# Patient Record
Sex: Male | Born: 1935 | Race: White | Hispanic: No | Marital: Married | Smoking: Former smoker
Health system: Southern US, Community
[De-identification: ages and names within clinical notes are randomized; demographics above are authoritative.]

## PROBLEM LIST (undated history)

## (undated) DIAGNOSIS — I82409 Acute embolism and thrombosis of unspecified deep veins of unspecified lower extremity: Secondary | ICD-10-CM

## (undated) DIAGNOSIS — M1990 Unspecified osteoarthritis, unspecified site: Secondary | ICD-10-CM

## (undated) DIAGNOSIS — N189 Chronic kidney disease, unspecified: Secondary | ICD-10-CM

## (undated) DIAGNOSIS — D649 Anemia, unspecified: Secondary | ICD-10-CM

## (undated) DIAGNOSIS — E119 Type 2 diabetes mellitus without complications: Secondary | ICD-10-CM

## (undated) DIAGNOSIS — Z992 Dependence on renal dialysis: Secondary | ICD-10-CM

## (undated) DIAGNOSIS — N200 Calculus of kidney: Secondary | ICD-10-CM

## (undated) DIAGNOSIS — K219 Gastro-esophageal reflux disease without esophagitis: Secondary | ICD-10-CM

## (undated) DIAGNOSIS — I8390 Asymptomatic varicose veins of unspecified lower extremity: Secondary | ICD-10-CM

## (undated) DIAGNOSIS — C801 Malignant (primary) neoplasm, unspecified: Secondary | ICD-10-CM

## (undated) DIAGNOSIS — I10 Essential (primary) hypertension: Secondary | ICD-10-CM

## (undated) DIAGNOSIS — N186 End stage renal disease: Secondary | ICD-10-CM

## (undated) DIAGNOSIS — R0602 Shortness of breath: Secondary | ICD-10-CM

## (undated) HISTORY — PX: OTHER SURGICAL HISTORY: SHX169

## (undated) HISTORY — PX: TONSILLECTOMY: SUR1361

## (undated) HISTORY — PX: BACK SURGERY: SHX140

## (undated) HISTORY — DX: Asymptomatic varicose veins of unspecified lower extremity: I83.90

## (undated) HISTORY — PX: EYE SURGERY: SHX253

## (undated) HISTORY — PX: APPENDECTOMY: SHX54

## (undated) HISTORY — PX: COLONOSCOPY: SHX174

## (undated) HISTORY — PX: CHOLECYSTECTOMY: SHX55

## (undated) HISTORY — PX: PROSTATE SURGERY: SHX751

## (undated) HISTORY — DX: Acute embolism and thrombosis of unspecified deep veins of unspecified lower extremity: I82.409

## (undated) HISTORY — PX: COLON SURGERY: SHX602

---

## 2000-09-07 ENCOUNTER — Ambulatory Visit (HOSPITAL_COMMUNITY): Admission: RE | Admit: 2000-09-07 | Discharge: 2000-09-07 | Payer: Self-pay | Admitting: Urology

## 2000-09-07 ENCOUNTER — Encounter: Payer: Self-pay | Admitting: Urology

## 2001-03-06 ENCOUNTER — Ambulatory Visit (HOSPITAL_COMMUNITY): Admission: RE | Admit: 2001-03-06 | Discharge: 2001-03-06 | Payer: Self-pay | Admitting: Internal Medicine

## 2001-03-06 ENCOUNTER — Encounter: Payer: Self-pay | Admitting: Internal Medicine

## 2003-04-29 ENCOUNTER — Ambulatory Visit (HOSPITAL_COMMUNITY): Admission: RE | Admit: 2003-04-29 | Discharge: 2003-04-29 | Payer: Self-pay | Admitting: Internal Medicine

## 2005-04-08 ENCOUNTER — Ambulatory Visit (HOSPITAL_COMMUNITY): Admission: RE | Admit: 2005-04-08 | Discharge: 2005-04-08 | Payer: Self-pay | Admitting: Pulmonary Disease

## 2006-06-01 ENCOUNTER — Ambulatory Visit: Payer: Self-pay | Admitting: Internal Medicine

## 2006-07-31 ENCOUNTER — Ambulatory Visit (HOSPITAL_COMMUNITY)
Admission: RE | Admit: 2006-07-31 | Discharge: 2006-07-31 | Payer: Self-pay | Admitting: Physical Medicine and Rehabilitation

## 2007-01-11 ENCOUNTER — Emergency Department (HOSPITAL_COMMUNITY): Admission: EM | Admit: 2007-01-11 | Discharge: 2007-01-11 | Payer: Self-pay | Admitting: Emergency Medicine

## 2008-09-05 ENCOUNTER — Ambulatory Visit (HOSPITAL_COMMUNITY)
Admission: RE | Admit: 2008-09-05 | Discharge: 2008-09-05 | Payer: Self-pay | Admitting: Physical Medicine and Rehabilitation

## 2009-01-12 DEATH — deceased

## 2009-07-24 ENCOUNTER — Encounter: Admission: RE | Admit: 2009-07-24 | Discharge: 2009-07-24 | Payer: Self-pay | Admitting: Neurosurgery

## 2009-08-21 ENCOUNTER — Ambulatory Visit (HOSPITAL_BASED_OUTPATIENT_CLINIC_OR_DEPARTMENT_OTHER): Admission: RE | Admit: 2009-08-21 | Discharge: 2009-08-21 | Payer: Self-pay | Admitting: Specialist

## 2009-09-02 ENCOUNTER — Ambulatory Visit (HOSPITAL_COMMUNITY): Admission: RE | Admit: 2009-09-02 | Discharge: 2009-09-02 | Payer: Self-pay | Admitting: Pulmonary Disease

## 2009-11-02 ENCOUNTER — Ambulatory Visit (HOSPITAL_COMMUNITY): Admission: RE | Admit: 2009-11-02 | Discharge: 2009-11-02 | Payer: Self-pay | Admitting: Neurosurgery

## 2009-12-11 ENCOUNTER — Inpatient Hospital Stay (HOSPITAL_COMMUNITY): Admission: RE | Admit: 2009-12-11 | Discharge: 2009-12-23 | Payer: Self-pay | Admitting: Neurosurgery

## 2009-12-18 ENCOUNTER — Ambulatory Visit: Payer: Self-pay | Admitting: Surgery

## 2009-12-18 ENCOUNTER — Encounter (INDEPENDENT_AMBULATORY_CARE_PROVIDER_SITE_OTHER): Payer: Self-pay | Admitting: Family Medicine

## 2009-12-19 ENCOUNTER — Encounter (INDEPENDENT_AMBULATORY_CARE_PROVIDER_SITE_OTHER): Payer: Self-pay | Admitting: Family Medicine

## 2010-04-04 ENCOUNTER — Encounter: Payer: Self-pay | Admitting: Nephrology

## 2010-04-22 ENCOUNTER — Other Ambulatory Visit (HOSPITAL_COMMUNITY): Payer: Self-pay | Admitting: Neurosurgery

## 2010-04-22 DIAGNOSIS — M549 Dorsalgia, unspecified: Secondary | ICD-10-CM

## 2010-04-23 ENCOUNTER — Ambulatory Visit (HOSPITAL_COMMUNITY)
Admission: RE | Admit: 2010-04-23 | Discharge: 2010-04-23 | Disposition: A | Discharge status: Home / Self Care | Payer: Medicare Other | Source: Ambulatory Visit | Attending: Neurosurgery | Admitting: Neurosurgery

## 2010-04-23 DIAGNOSIS — M549 Dorsalgia, unspecified: Secondary | ICD-10-CM

## 2010-04-23 DIAGNOSIS — M5126 Other intervertebral disc displacement, lumbar region: Secondary | ICD-10-CM | POA: Insufficient documentation

## 2010-04-23 DIAGNOSIS — M5380 Other specified dorsopathies, site unspecified: Secondary | ICD-10-CM | POA: Insufficient documentation

## 2010-04-23 DIAGNOSIS — M79609 Pain in unspecified limb: Secondary | ICD-10-CM | POA: Insufficient documentation

## 2010-04-23 DIAGNOSIS — Z981 Arthrodesis status: Secondary | ICD-10-CM | POA: Insufficient documentation

## 2010-04-23 DIAGNOSIS — E279 Disorder of adrenal gland, unspecified: Secondary | ICD-10-CM | POA: Insufficient documentation

## 2010-04-23 DIAGNOSIS — M545 Low back pain, unspecified: Secondary | ICD-10-CM | POA: Insufficient documentation

## 2010-04-23 DIAGNOSIS — M48061 Spinal stenosis, lumbar region without neurogenic claudication: Secondary | ICD-10-CM | POA: Insufficient documentation

## 2010-04-23 LAB — CREATININE, SERUM
Creatinine, Ser: 2.19 mg/dL — ABNORMAL HIGH (ref 0.4–1.5)
GFR calc non Af Amer: 30 mL/min — ABNORMAL LOW (ref >60)

## 2010-05-21 NOTE — Discharge Summary (Signed)
  Gregory Rangel, Gregory Rangel              ACCOUNT NO.:  [id number]  MEDICAL RECORD NO.:  [id number]          PATIENT TYPE:  INP  LOCATION:  3033                         FACILITY:  [facility]  PHYSICIAN:  Danae Orleans. Venetia Maxon, M.D.  DATE OF BIRTH:  May 19, 1933  DATE OF ADMISSION:  12/11/2009 DATE OF DISCHARGE:  12/23/2009                              DISCHARGE SUMMARY   REASON FOR ADMISSION: 1. Spondylosis. 2. Degenerative disk disease. 3. Back pain. 4. Lumbar radiculopathy.  FINAL DIAGNOSES: 1. Spondylosis. 2. Degenerative disk disease. 3. Back pain. 4. Lumbar radiculopathy. 5. Status post lumbar fusion on December 11, 2009 and interval     development of deep venous thrombosis, which required     anticoagulation with heparin and subsequently Coumadin.  HISTORY OF ILLNESS AND HOSPITAL COURSE:  Gregory Rangel is a 77 year old man with back pain and disk degeneration at L2-3, L3-4, and L4-5 levels. The patient was admitted on same day's procedure basis and underwent anterolateral decompression and fusion with interbody grafts.  The patient was mobilized postoperatively, was complaining of some back pain, which limited mobility.  The patient had required management of his diabetes.  He also developed a rash in his upper back.  He was then seen by the Hospitalist for a concern of possible cellulitis in his lower extremity.  He was found to have a DVT in his popliteal vein and was started on intravenous heparin.  It was felt not to have cellulitis. Started on Coumadin.  INR was slowly elevated on December 22, 2009 was 1.93.  The patient was then therapeutic on Coumadin on December 23, 2009, was discharged home with followup with Anticoagulation Clinic following week for INR check.  DISCHARGE MEDICATIONS:  Include: 1. Multivitamin. 2. Losartan 100 mg daily. 3. Hydrochlorothiazide 25 mg daily. 4. Simvastatin 40 mg daily. 5. Amlodipine 10 mg daily. 6. Benazepril 20 mg daily. 7. Glipizide XL 10  mg daily. 8. Actos 30 mg daily.  The patient was sent home with pain medications and oral Coumadin.  The patient was instructed to follow up with Dr. Channing Mutters in his office 3 weeks postoperatively.     Danae Orleans. Venetia Maxon, M.D.     JDS/MEDQ  D:  05/06/2010  T:  05/06/2010  Job:  [id number]  Electronically Signed by Maeola Harman M.D. on 05/21/2010 12:13:27 PM

## 2010-05-25 ENCOUNTER — Ambulatory Visit (HOSPITAL_COMMUNITY)
Admission: RE | Admit: 2010-05-25 | Discharge: 2010-05-25 | Disposition: A | Discharge status: Home / Self Care | Payer: Medicare Other | Source: Ambulatory Visit | Attending: Neurosurgery | Admitting: Neurosurgery

## 2010-05-25 ENCOUNTER — Other Ambulatory Visit (HOSPITAL_COMMUNITY): Payer: Self-pay | Admitting: Neurosurgery

## 2010-05-25 DIAGNOSIS — M47816 Spondylosis without myelopathy or radiculopathy, lumbar region: Secondary | ICD-10-CM

## 2010-05-25 DIAGNOSIS — M47817 Spondylosis without myelopathy or radiculopathy, lumbosacral region: Secondary | ICD-10-CM | POA: Insufficient documentation

## 2010-05-25 DIAGNOSIS — M545 Low back pain, unspecified: Secondary | ICD-10-CM | POA: Insufficient documentation

## 2010-05-25 DIAGNOSIS — Z09 Encounter for follow-up examination after completed treatment for conditions other than malignant neoplasm: Secondary | ICD-10-CM | POA: Insufficient documentation

## 2010-05-27 LAB — GLUCOSE, CAPILLARY
Glucose-Capillary: 112 mg/dL — ABNORMAL HIGH (ref 70–99)
Glucose-Capillary: 127 mg/dL — ABNORMAL HIGH (ref 70–99)
Glucose-Capillary: 130 mg/dL — ABNORMAL HIGH (ref 70–99)
Glucose-Capillary: 136 mg/dL — ABNORMAL HIGH (ref 70–99)
Glucose-Capillary: 138 mg/dL — ABNORMAL HIGH (ref 70–99)
Glucose-Capillary: 138 mg/dL — ABNORMAL HIGH (ref 70–99)
Glucose-Capillary: 142 mg/dL — ABNORMAL HIGH (ref 70–99)
Glucose-Capillary: 143 mg/dL — ABNORMAL HIGH (ref 70–99)
Glucose-Capillary: 143 mg/dL — ABNORMAL HIGH (ref 70–99)
Glucose-Capillary: 146 mg/dL — ABNORMAL HIGH (ref 70–99)
Glucose-Capillary: 153 mg/dL — ABNORMAL HIGH (ref 70–99)
Glucose-Capillary: 153 mg/dL — ABNORMAL HIGH (ref 70–99)
Glucose-Capillary: 159 mg/dL — ABNORMAL HIGH (ref 70–99)
Glucose-Capillary: 160 mg/dL — ABNORMAL HIGH (ref 70–99)
Glucose-Capillary: 160 mg/dL — ABNORMAL HIGH (ref 70–99)
Glucose-Capillary: 162 mg/dL — ABNORMAL HIGH (ref 70–99)
Glucose-Capillary: 173 mg/dL — ABNORMAL HIGH (ref 70–99)
Glucose-Capillary: 174 mg/dL — ABNORMAL HIGH (ref 70–99)
Glucose-Capillary: 177 mg/dL — ABNORMAL HIGH (ref 70–99)
Glucose-Capillary: 182 mg/dL — ABNORMAL HIGH (ref 70–99)
Glucose-Capillary: 188 mg/dL — ABNORMAL HIGH (ref 70–99)
Glucose-Capillary: 204 mg/dL — ABNORMAL HIGH (ref 70–99)
Glucose-Capillary: 208 mg/dL — ABNORMAL HIGH (ref 70–99)
Glucose-Capillary: 211 mg/dL — ABNORMAL HIGH (ref 70–99)
Glucose-Capillary: 213 mg/dL — ABNORMAL HIGH (ref 70–99)
Glucose-Capillary: 234 mg/dL — ABNORMAL HIGH (ref 70–99)
Glucose-Capillary: 237 mg/dL — ABNORMAL HIGH (ref 70–99)
Glucose-Capillary: 113 mg/dL — ABNORMAL HIGH (ref 70–99)
Glucose-Capillary: 123 mg/dL — ABNORMAL HIGH (ref 70–99)

## 2010-05-27 LAB — BASIC METABOLIC PANEL
BUN: 34 mg/dL — ABNORMAL HIGH (ref 6–23)
CO2: 34 mEq/L — ABNORMAL HIGH (ref 19–32)
Calcium: 9.7 mg/dL (ref 8.4–10.5)
GFR calc Af Amer: 38 mL/min — ABNORMAL LOW (ref >60)
GFR calc non Af Amer: 32 mL/min — ABNORMAL LOW (ref >60)
Glucose, Bld: 192 mg/dL — ABNORMAL HIGH (ref 70–99)
Potassium: 4 mEq/L (ref 3.5–5.1)
Sodium: 138 mEq/L (ref 135–145)
Sodium: 144 mEq/L (ref 135–145)

## 2010-05-27 LAB — COMPREHENSIVE METABOLIC PANEL
ALT: 32 U/L (ref 0–53)
AST: 36 U/L (ref 0–37)
AST: 56 U/L — ABNORMAL HIGH (ref 0–37)
Albumin: 2.6 g/dL — ABNORMAL LOW (ref 3.5–5.2)
Albumin: 2.6 g/dL — ABNORMAL LOW (ref 3.5–5.2)
Albumin: 2.7 g/dL — ABNORMAL LOW (ref 3.5–5.2)
Albumin: 2.9 g/dL — ABNORMAL LOW (ref 3.5–5.2)
Albumin: 3.6 g/dL (ref 3.5–5.2)
Alkaline Phosphatase: 74 U/L (ref 39–117)
Alkaline Phosphatase: 103 U/L (ref 39–117)
BUN: 16 mg/dL (ref 6–23)
BUN: 16 mg/dL (ref 6–23)
BUN: 23 mg/dL (ref 6–23)
BUN: 29 mg/dL — ABNORMAL HIGH (ref 6–23)
BUN: 32 mg/dL — ABNORMAL HIGH (ref 6–23)
BUN: 47 mg/dL — ABNORMAL HIGH (ref 6–23)
CO2: 26 mEq/L (ref 19–32)
CO2: 31 mEq/L (ref 19–32)
Calcium: 9.1 mg/dL (ref 8.4–10.5)
Calcium: 9.4 mg/dL (ref 8.4–10.5)
Calcium: 9.6 mg/dL (ref 8.4–10.5)
Chloride: 104 mEq/L (ref 96–112)
Chloride: 105 mEq/L (ref 96–112)
Chloride: 108 mEq/L (ref 96–112)
Chloride: 109 mEq/L (ref 96–112)
Chloride: 106 mEq/L (ref 96–112)
Creatinine, Ser: 1.73 mg/dL — ABNORMAL HIGH (ref 0.4–1.5)
Creatinine, Ser: 1.77 mg/dL — ABNORMAL HIGH (ref 0.4–1.5)
Creatinine, Ser: 1.87 mg/dL — ABNORMAL HIGH (ref 0.4–1.5)
Creatinine, Ser: 2.08 mg/dL — ABNORMAL HIGH (ref 0.4–1.5)
GFR calc Af Amer: 43 mL/min — ABNORMAL LOW (ref >60)
GFR calc Af Amer: 46 mL/min — ABNORMAL LOW (ref >60)
GFR calc non Af Amer: 38 mL/min — ABNORMAL LOW (ref >60)
GFR calc non Af Amer: 39 mL/min — ABNORMAL LOW (ref >60)
GFR calc non Af Amer: 28 mL/min — ABNORMAL LOW (ref >60)
Glucose, Bld: 100 mg/dL — ABNORMAL HIGH (ref 70–99)
Potassium: 3.9 mEq/L (ref 3.5–5.1)
Potassium: 3.9 mEq/L (ref 3.5–5.1)
Potassium: 4.6 mEq/L (ref 3.5–5.1)
Total Bilirubin: 0.5 mg/dL (ref 0.3–1.2)
Total Bilirubin: 0.5 mg/dL (ref 0.3–1.2)
Total Bilirubin: 0.5 mg/dL (ref 0.3–1.2)
Total Bilirubin: 0.4 mg/dL (ref 0.3–1.2)
Total Protein: 5.8 g/dL — ABNORMAL LOW (ref 6.0–8.3)
Total Protein: 6.2 g/dL (ref 6.0–8.3)

## 2010-05-27 LAB — CBC
HCT: 36.3 % — ABNORMAL LOW (ref 39.0–52.0)
HCT: 38 % — ABNORMAL LOW (ref 39.0–52.0)
HCT: 44.7 % (ref 39.0–52.0)
Hemoglobin: 11.7 g/dL — ABNORMAL LOW (ref 13.0–17.0)
Hemoglobin: 12.1 g/dL — ABNORMAL LOW (ref 13.0–17.0)
Hemoglobin: 14.8 g/dL (ref 13.0–17.0)
MCH: 28.6 pg (ref 26.0–34.0)
MCH: 29.2 pg (ref 26.0–34.0)
MCH: 29.3 pg (ref 26.0–34.0)
MCH: 29.4 pg (ref 26.0–34.0)
MCH: 29.8 pg (ref 26.0–34.0)
MCH: 30.4 pg (ref 26.0–34.0)
MCH: 30.5 pg (ref 26.0–34.0)
MCHC: 31.8 g/dL (ref 30.0–36.0)
MCHC: 32 g/dL (ref 30.0–36.0)
MCHC: 32.1 g/dL (ref 30.0–36.0)
MCHC: 32.1 g/dL (ref 30.0–36.0)
MCHC: 33.3 g/dL (ref 30.0–36.0)
MCV: 89.5 fL (ref 78.0–100.0)
MCV: 90.8 fL (ref 78.0–100.0)
MCV: 91.4 fL (ref 78.0–100.0)
MCV: 91.4 fL (ref 78.0–100.0)
MCV: 92 fL (ref 78.0–100.0)
Platelets: 167 10*3/uL (ref 150–400)
Platelets: 207 10*3/uL (ref 150–400)
Platelets: 215 10*3/uL (ref 150–400)
Platelets: 229 10*3/uL (ref 150–400)
Platelets: 171 10*3/uL (ref 150–400)
RBC: 3.97 MIL/uL — ABNORMAL LOW (ref 4.22–5.81)
RBC: 4 MIL/uL — ABNORMAL LOW (ref 4.22–5.81)
RBC: 4.09 MIL/uL — ABNORMAL LOW (ref 4.22–5.81)
RBC: 4.86 MIL/uL (ref 4.22–5.81)
RDW: 15 % (ref 11.5–15.5)
RDW: 15.1 % (ref 11.5–15.5)
RDW: 15.1 % (ref 11.5–15.5)
WBC: 7.9 10*3/uL (ref 4.0–10.5)
WBC: 8.4 10*3/uL (ref 4.0–10.5)
WBC: 10.3 10*3/uL (ref 4.0–10.5)

## 2010-05-27 LAB — URINALYSIS, ROUTINE W REFLEX MICROSCOPIC
Ketones, ur: NEGATIVE mg/dL
Nitrite: NEGATIVE
pH: 5 (ref 5.0–8.0)

## 2010-05-27 LAB — DIFFERENTIAL
Basophils Absolute: 0.1 10*3/uL (ref 0.0–0.1)
Basophils Relative: 1 % (ref 0–1)
Basophils Relative: 1 % (ref 0–1)
Eosinophils Absolute: 0.5 10*3/uL (ref 0.0–0.7)
Eosinophils Relative: 6 % — ABNORMAL HIGH (ref 0–5)
Eosinophils Relative: 2 % (ref 0–5)
Lymphs Abs: 1.1 10*3/uL (ref 0.7–4.0)
Monocytes Absolute: 0.8 10*3/uL (ref 0.1–1.0)
Neutro Abs: 7.4 10*3/uL (ref 1.7–7.7)

## 2010-05-27 LAB — PROTIME-INR
INR: 1.16 (ref 0.00–1.49)
INR: 1.93 — ABNORMAL HIGH (ref 0.00–1.49)
INR: 2.1 — ABNORMAL HIGH (ref 0.00–1.49)
Prothrombin Time: 13.6 seconds (ref 11.6–15.2)
Prothrombin Time: 15 seconds (ref 11.6–15.2)
Prothrombin Time: 22.2 seconds — ABNORMAL HIGH (ref 11.6–15.2)
Prothrombin Time: 23.7 seconds — ABNORMAL HIGH (ref 11.6–15.2)
Prothrombin Time: 12.6 seconds (ref 11.6–15.2)

## 2010-05-27 LAB — HEPARIN LEVEL (UNFRACTIONATED)
Heparin Unfractionated: 0.31 IU/mL (ref 0.30–0.70)
Heparin Unfractionated: 0.37 IU/mL (ref 0.30–0.70)
Heparin Unfractionated: 0.41 IU/mL (ref 0.30–0.70)

## 2010-05-27 LAB — APTT: aPTT: 47 seconds — ABNORMAL HIGH (ref 24–37)

## 2010-05-27 LAB — TYPE AND SCREEN: Antibody Screen: NEGATIVE

## 2010-05-27 LAB — CULTURE, BLOOD (ROUTINE X 2)
Culture  Setup Time: 201110080203
Culture: NO GROWTH

## 2010-05-27 LAB — C-REACTIVE PROTEIN: CRP: 10.6 mg/dL — ABNORMAL HIGH (ref <0.6)

## 2010-05-31 LAB — CBC
MCHC: 33 g/dL (ref 30.0–36.0)
MCV: 91.4 fL (ref 78.0–100.0)
Platelets: 202 10*3/uL (ref 150–400)
WBC: 8.3 10*3/uL (ref 4.0–10.5)

## 2010-05-31 LAB — DIFFERENTIAL
Basophils Relative: 1 % (ref 0–1)
Eosinophils Absolute: 0.1 10*3/uL (ref 0.0–0.7)
Neutrophils Relative %: 73 % (ref 43–77)

## 2010-05-31 LAB — BASIC METABOLIC PANEL
BUN: 36 mg/dL — ABNORMAL HIGH (ref 6–23)
CO2: 29 mEq/L (ref 19–32)
Chloride: 104 mEq/L (ref 96–112)
Creatinine, Ser: 2.02 mg/dL — ABNORMAL HIGH (ref 0.4–1.5)

## 2010-05-31 LAB — GLUCOSE, CAPILLARY: Glucose-Capillary: 110 mg/dL — ABNORMAL HIGH (ref 70–99)

## 2010-06-18 ENCOUNTER — Ambulatory Visit (HOSPITAL_COMMUNITY)
Admission: RE | Admit: 2010-06-18 | Discharge: 2010-06-18 | Disposition: A | Discharge status: Home / Self Care | Payer: Medicare Other | Source: Ambulatory Visit | Attending: Neurosurgery | Admitting: Neurosurgery

## 2010-06-18 ENCOUNTER — Other Ambulatory Visit (HOSPITAL_COMMUNITY): Payer: Self-pay | Admitting: Neurosurgery

## 2010-06-18 DIAGNOSIS — M4716 Other spondylosis with myelopathy, lumbar region: Secondary | ICD-10-CM

## 2010-06-18 DIAGNOSIS — M545 Low back pain, unspecified: Secondary | ICD-10-CM | POA: Insufficient documentation

## 2010-06-23 ENCOUNTER — Other Ambulatory Visit (HOSPITAL_COMMUNITY): Payer: Self-pay | Admitting: Pulmonary Disease

## 2010-06-23 DIAGNOSIS — I82409 Acute embolism and thrombosis of unspecified deep veins of unspecified lower extremity: Secondary | ICD-10-CM

## 2010-07-14 ENCOUNTER — Ambulatory Visit (HOSPITAL_COMMUNITY)
Admission: RE | Admit: 2010-07-14 | Discharge: 2010-07-14 | Disposition: A | Discharge status: Home / Self Care | Payer: Medicare Other | Source: Ambulatory Visit | Attending: Pulmonary Disease | Admitting: Pulmonary Disease

## 2010-07-14 DIAGNOSIS — M7989 Other specified soft tissue disorders: Secondary | ICD-10-CM | POA: Insufficient documentation

## 2010-07-14 DIAGNOSIS — I82409 Acute embolism and thrombosis of unspecified deep veins of unspecified lower extremity: Secondary | ICD-10-CM

## 2010-07-30 NOTE — Op Note (Signed)
NAMEMarland Kitchen  Gregory Rangel, Gregory Rangel                        ACCOUNT NO.:  [id number]   MEDICAL RECORD NO.:  [id number]                   PATIENT TYPE:  AMB   LOCATION:  DAY                                  FACILITY:  [facility]   PHYSICIAN:  Lionel December, M.D.                 DATE OF BIRTH:  April 16, 1933   DATE OF PROCEDURE:  04/29/2003  DATE OF DISCHARGE:                                 OPERATIVE REPORT   PROCEDURE PERFORMED:  Total colonoscopy with polypectomy.   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  His last colonoscopy was over four years ago.  He  is undergoing surveillance exam.  Presently, he does not have any GI  symptoms.  The procedure was reviewed with the patient and informed consent  was obtained.   PREOP MEDICATIONS:  Demerol 50 mg IV, Versed 5 mg IV in divided dose.   FINDINGS:  Procedure performed in endoscopy suite.  Patient's vital signs  and oxygen saturations were monitored during procedure and remained stable.  The patient was placed in left lateral position and rectal exam was  performed.  No abnormality noted external or digital exam.  Olympus  videoscope was placed in the rectum and advanced to the region of the  sigmoid colon where scattered diverticula were noted.  Preparation was  satisfactory.  Scope was passed in the area of the hepatic flexure where  ileocolic anastomosis was identified.  Distal segment of terminal ileum was  examined and was normal.  Anastomosis was wide open.  As the scope was  withdrawn, colonic mucosa was carefully examined.  There were two small  polyps a few centimeters distal to the anastomosis which were snared and  retrieved for histologic examination and sent in one container.  There was  another small polyp in the descending colon which was coagulated using snare  tip.  Mucosa rest of the colon was normal.   Rectal mucosa similarly was  normal.  Prominent submucosal rectal veins were noted.  Scope was  retroflexed to examine.  Anorectal junction was unremarkable. Endoscope was  straightened and withdrawn.  The patient tolerated the procedure well.   FINAL DIAGNOSES:  1. Left colonic diverticulosis.  2. Two small polyps snared from the proximal transverse colon distal to     anastomosis.  Third polyp was coagulated.   RECOMMENDATIONS:  1. High fiber diet.  2. Citrucel or equivalent 1 tablespoonful daily.  3. I will be contacting patient with biopsy result.  4. He should return for follow-up exam in four to five years from now.      ___________________________________________  Lionel December, M.D.   NR/MEDQ  D:  04/29/2003  T:  04/29/2003  Job:  [id number]   cc:   Ramon Dredge L. Juanetta Gosling, M.D.  [address]  [place]  [place] [address]  Fax: [phone]

## 2010-08-23 ENCOUNTER — Other Ambulatory Visit (HOSPITAL_COMMUNITY): Payer: Self-pay | Admitting: Neurosurgery

## 2010-08-23 DIAGNOSIS — M47817 Spondylosis without myelopathy or radiculopathy, lumbosacral region: Secondary | ICD-10-CM

## 2010-08-25 ENCOUNTER — Ambulatory Visit (HOSPITAL_COMMUNITY): Payer: Medicare Other

## 2010-08-26 ENCOUNTER — Ambulatory Visit (HOSPITAL_COMMUNITY)
Admission: RE | Admit: 2010-08-26 | Discharge: 2010-08-26 | Disposition: A | Discharge status: Home / Self Care | Payer: Medicare Other | Source: Ambulatory Visit | Attending: Neurosurgery | Admitting: Neurosurgery

## 2010-08-26 DIAGNOSIS — M79609 Pain in unspecified limb: Secondary | ICD-10-CM | POA: Insufficient documentation

## 2010-08-26 DIAGNOSIS — M545 Low back pain, unspecified: Secondary | ICD-10-CM | POA: Insufficient documentation

## 2010-08-26 DIAGNOSIS — M47817 Spondylosis without myelopathy or radiculopathy, lumbosacral region: Secondary | ICD-10-CM

## 2010-12-22 LAB — DIFFERENTIAL
Basophils Relative: 1
Lymphs Abs: 1.6
Monocytes Relative: 6
Neutro Abs: 8.9 — ABNORMAL HIGH
Neutrophils Relative %: 77

## 2010-12-22 LAB — CBC
MCHC: 33.1
RBC: 4.82
WBC: 11.5 — ABNORMAL HIGH

## 2010-12-22 LAB — BASIC METABOLIC PANEL
Calcium: 9.9
Creatinine, Ser: 2.11 — ABNORMAL HIGH
GFR calc Af Amer: 38 — ABNORMAL LOW

## 2011-04-05 DIAGNOSIS — M1710 Unilateral primary osteoarthritis, unspecified knee: Secondary | ICD-10-CM | POA: Diagnosis not present

## 2011-04-19 DIAGNOSIS — Z961 Presence of intraocular lens: Secondary | ICD-10-CM | POA: Diagnosis not present

## 2011-04-19 DIAGNOSIS — E119 Type 2 diabetes mellitus without complications: Secondary | ICD-10-CM | POA: Diagnosis not present

## 2011-04-19 DIAGNOSIS — H2510 Age-related nuclear cataract, unspecified eye: Secondary | ICD-10-CM | POA: Diagnosis not present

## 2011-04-19 DIAGNOSIS — H26019 Infantile and juvenile cortical, lamellar, or zonular cataract, unspecified eye: Secondary | ICD-10-CM | POA: Diagnosis not present

## 2011-05-05 DIAGNOSIS — E1159 Type 2 diabetes mellitus with other circulatory complications: Secondary | ICD-10-CM | POA: Diagnosis not present

## 2011-05-05 DIAGNOSIS — E119 Type 2 diabetes mellitus without complications: Secondary | ICD-10-CM | POA: Diagnosis not present

## 2011-05-09 ENCOUNTER — Ambulatory Visit (HOSPITAL_COMMUNITY)
Admission: RE | Admit: 2011-05-09 | Discharge: 2011-05-09 | Disposition: A | Discharge status: Home / Self Care | Payer: Medicare Other | Source: Ambulatory Visit | Attending: Neurosurgery | Admitting: Neurosurgery

## 2011-05-09 DIAGNOSIS — M545 Low back pain, unspecified: Secondary | ICD-10-CM | POA: Diagnosis not present

## 2011-05-09 DIAGNOSIS — IMO0001 Reserved for inherently not codable concepts without codable children: Secondary | ICD-10-CM | POA: Diagnosis not present

## 2011-05-09 DIAGNOSIS — M6281 Muscle weakness (generalized): Secondary | ICD-10-CM | POA: Diagnosis not present

## 2011-05-09 DIAGNOSIS — R269 Unspecified abnormalities of gait and mobility: Secondary | ICD-10-CM | POA: Diagnosis not present

## 2011-05-09 DIAGNOSIS — M47816 Spondylosis without myelopathy or radiculopathy, lumbar region: Secondary | ICD-10-CM | POA: Insufficient documentation

## 2011-05-09 NOTE — Evaluation (Signed)
Physical Therapy Evaluation  Patient Details  Name: Gregory Rangel MRN: [id number] Date of Birth: 07/31/1933  Today's Date: 05/09/2011 Time: 9629-5284 Time Calculation (min): 44 min Charges: 1 eval Visit#: 1  of 12   Re-eval: 06/08/11 Assessment Diagnosis: Low Back Pain Surgical Date: 12/13/11 Next MD Visit: April Prior Therapy: None  Subjective Symptoms/Limitations Symptoms: Pt reports that spinal fusion in his low back in about October 2011, and June 2012.  He reports that he continues to have pain after the surgery.  He had a R knee scope, because he had so much pain he couldn't walk.  Recently had a cortisone injection to the R knee.  Pain: 2-10/10 to his low back.  nature: Throbbing pain to his low back.  Taking pain tyleonol to control pain  history of: DM II, Colon Cancer. How long can you sit comfortably?: 30 minutes.   How long can you stand comfortably?: 5 minutes while leaning on a counter or cane.  How long can you walk comfortably?: 15 minutes with a SPC Pain Assessment Currently in Pain?: Yes Pain Score:   2 Pain Location: Back Pain Frequency: Constant  Precautions/Restrictions  Precautions Precautions: Back;Fall Precaution Booklet Issued: Yes (comment) Precaution Comments: PMH: COLON CANCER   Prior Functioning  Home Living Lives With: Spouse Receives Help From: Family Type of Home: House Home Access: Stairs to enter Entrance Stairs-Rails: None Entrance Stairs-Number of Steps: 2 Prior Function Leisure: Hobbies-yes (Comment) Comments: He enjoys working outside and mowing his lawn, planting a garden  Cognition/Observation Observation/Other Assessments Observations: Significant kyphosis. requires mod A to acheieve prone position.  Significant decrease in bilateral: hamstring, piriformis, hip flexor, quadricep, gluteal and lumbar flexibility.  Sensation/Coordination/Flexibility/Functional Tests Functional Tests Functional Tests: ODI:  66%  Assessment RLE AROM (degrees) RLE Overall AROM Comments: Decreased R knee AROM secondary to previous knee scope. RLE Strength Right Hip Flexion: 3/5 Right Hip Extension: 2+/5 Right Hip ADduction: 2+/5 Right Knee Flexion: 4/5 Right Knee Extension: 3+/5 LLE Strength Left Hip Flexion: 3/5 Left Hip Extension: 2+/5 Left Hip ABduction: 2/5 Left Hip ADduction: 2/5 Left Knee Flexion: 4/5 Left Knee Extension: 4/5 Lumbar AROM Lumbar Flexion: Decreased 75% Lumbar Extension: Decreased 100% Lumbar - Right Side Bend: Decreased 90% Lumbar - Left Side Bend: Decreased 90% Lumbar Strength Lumbar Flexion: 2/5 Lumbar Extension: 2/5 Palpation Palpation: increased pain and spasm to the L gluteal and lumbosacral region.  Mobility/Balance  Bed Mobility Bed Mobility: Yes (MOD A for supine to prone to supine) Ambulation/Gait Ambulation/Gait: Yes Ambulation/Gait Assistance: 6: Modified independent (Device/Increase time) Assistive device: Straight cane Gait Pattern: Step-through pattern;Antalgic;Lateral hip instability;Decreased trunk rotation Posture/Postural Control Posture/Postural Control: Postural limitations Postural Limitations: Pt with significant kyphosis and notable core weakness which allows for slouched posture and upper cross syndrome.  Static Standing Balance Static Standing - Comment/# of Minutes: Pt requires assistive device for balance and unable to demonstrate balance activities secondary to pain.   Exercise/Treatments Stability Bridge: 10 reps Straight Leg Raises: 10 Reps BLE, Prone Supine Ab set with single leg extension x10 BLE  Physical Therapy Assessment and Plan PT Assessment and Plan Clinical Impression Statement: Pt is a 78 year old male referred to PT for lumbar spondylosis with history of back surgery.  After examiniation it was found that he has current impairments including increased pain, decreased core and LE strength and power, decreased muscular endurance,  impaired balance, difficulty walking, impaired flexibility and impaired percieved functional ability which is limiting his ability to participate in household activities.  Pt will  benefit from skilled OPPT in order to address above impairments in order to maximzie indepenced and improve QOL. Rehab Potential: Fair PT Frequency: Min 3X/week PT Duration: 6 weeks PT Treatment/Interventions: Gait training;Stair training;Functional mobility training;Therapeutic activities;Therapeutic exercise;Balance training;Patient/family education;Other (comment) (MANUAL ONLY, NO MODALITIES) PT Plan: NO MODALITIES.  Add either bike or TM walking for endurance, functional squats, hee/toe raises, standing marching, tandem stance, core stability exercises, heel squeezes, LTR    Goals Home Exercise Program Pt will Perform Home Exercise Program: Independently PT Goal: Perform Home Exercise Program - Progress: Goal set today PT Short Term Goals Time to Complete Short Term Goals: 2 weeks PT Short Term Goal 1: Pt will report pain less than or equal to 5/10 for 50% of his day. PT Short Term Goal 2: Pt will improve his core strength by 1 muscle grade. PT Short Term Goal 3: Pt will present with decreased muscular adhesions to his lumbosacral and gluteal region.  PT Short Term Goal 4: Pt will improve his LE endurance and ambulate 2 minutes independently.  PT Long Term Goals Time to Complete Long Term Goals: 4 weeks PT Long Term Goal 1: Pt will report pain less than 3/10 for 75% of his day for improved QOL PT Long Term Goal 2: Pt will imrpove his ODI score to less than or equal to 45% for improved QOL. Long Term Goal 3: Pt will improve his core strength by 2 muscle grades in order to tolerate standing for 10 minutes in order to complete household activities. Long Term Goal 4: Pt will improve LE flexibility and endurance in order to tolerate ambulating independently x15 minutes in order to participate in community activities.   PT Long Term Goal 5: Pt will improve his dynamic balance and ambulate independently on grass x100 feet in order to safely return to outdoor hobbies.   Problem List Patient Active Problem List  Diagnoses  . Lumbago  . Lumbar spondylosis    PT - End of Session Activity Tolerance: Patient limited by pain PT Plan of Care PT Home Exercise Plan: see scanned report Consulted and Agree with Plan of Care: Patient  GP  Functional Reporting Modifier  Current Status  [phone] - Mobility: Walking & Moving Around CL - At least 60% but less than 80% impaired, limited or restricted  Goal Status  [id number] - Mobility: [place] & Moving Around CJ - At least 20% but less than 40% impaired, limited or restricted   Based on ODI 66% impairment  Ameirah Khatoon 05/09/2011, 4:51 PM  Physician Documentation Your signature is required to indicate approval of the treatment plan as stated above.  Please sign and either send electronically or make a copy of this report for your files and return this physician signed original.   Please mark one 1.__approve of plan  2. ___approve of plan with the following conditions.   ______________________________                                                          _____________________ Physician Signature  Date  

## 2011-05-11 ENCOUNTER — Ambulatory Visit (HOSPITAL_COMMUNITY)
Admission: RE | Admit: 2011-05-11 | Discharge: 2011-05-11 | Disposition: A | Discharge status: Home / Self Care | Payer: Medicare Other | Source: Ambulatory Visit | Attending: Pulmonary Disease | Admitting: Pulmonary Disease

## 2011-05-11 NOTE — Progress Notes (Signed)
Physical Therapy Treatment Patient Details  Name: MASATO PETTIE MRN: [id number] Date of Birth: 1933-08-24  Today's Date: 05/11/2011 Time: 1350-1440 Time Calculation (min): 50 min Visit#: 2  of 12   Re-eval: 06/08/11  Charge: therex 33 min  NMR 17 min  Subjective: Symptoms/Limitations Symptoms: LBP 6/10 both sides.  Pain Assessment Currently in Pain?: Yes Pain Score:   6 Pain Location: Back Pain Orientation: Lower  Objective:   Exercise/Treatments Stretches Lower Trunk Rotation: 5 reps;10 seconds Stability Clam: Supine;10 reps;3 seconds Bridge: 10 reps Bent Knee Raise: Supine;10 reps;3 seconds Ab Set: Supine;Limitations AB Set Limitations: with bent knee raise Heel Squeeze: Prone;5 reps;5 seconds Leg Raise: Prone;5 reps Functional Squats: 10 reps Heel Raises: 10 reps;Limitations Heel Raises Limitations: toe raises 10 reps Machine Exercises Stationary Bike: 6' @ 1.0 for endurance  Standing SLS: L 11", R 27" max of 3 no HHA Other Standing Knee Exercises: marching 10 reps 5" holds Other Standing Knee Exercises: tandem stance 3x 30"     Physical Therapy Assessment and Plan PT Assessment and Plan Clinical Impression Statement: Began new exercises per PT POC, pt required constant cueing for posture with standing exercises.  Pt required manual assistance with squats for proper form/technique.  Weak core musculature presented with new mat activitie with min cueing for stability required.  Pt tolerated well towards total session. PT Plan: NO MODALITIES.  Continue with current POC, switch to TM walking for endurance with emphasis on posture, progress to postural strengthening with tbands when ready.    Goals    Problem List Patient Active Problem List  Diagnoses  . Lumbago  . Lumbar spondylosis    PT - End of Session Activity Tolerance: Patient tolerated treatment well General Behavior During Session: [facility] for tasks performed Cognition: [facility] for tasks  performed  Juel Burrow, PTA 05/11/2011, 3:58 PM

## 2011-05-13 ENCOUNTER — Ambulatory Visit (HOSPITAL_COMMUNITY)
Admission: RE | Admit: 2011-05-13 | Discharge: 2011-05-13 | Disposition: A | Discharge status: Home / Self Care | Payer: Medicare Other | Source: Ambulatory Visit | Attending: Pulmonary Disease | Admitting: Pulmonary Disease

## 2011-05-13 DIAGNOSIS — R269 Unspecified abnormalities of gait and mobility: Secondary | ICD-10-CM | POA: Diagnosis not present

## 2011-05-13 DIAGNOSIS — M6281 Muscle weakness (generalized): Secondary | ICD-10-CM | POA: Insufficient documentation

## 2011-05-13 DIAGNOSIS — IMO0001 Reserved for inherently not codable concepts without codable children: Secondary | ICD-10-CM | POA: Insufficient documentation

## 2011-05-13 DIAGNOSIS — M545 Low back pain, unspecified: Secondary | ICD-10-CM | POA: Diagnosis not present

## 2011-05-13 NOTE — Progress Notes (Signed)
Physical Therapy Treatment Patient Details  Name: Gregory Rangel MRN: [id number] Date of Birth: Mar 03, 1934  Today's Date: 05/13/2011 Time: 4098-1191 Time Calculation (min): 43 min Visit#: 3  of 12   Re-eval: 06/08/11  Charge: therex: 43 min  Subjective: Symptoms/Limitations Symptoms: LBP 6/10 and R knee that I had replaced last year is bothering me as well.   Pain Assessment Currently in Pain?: Yes Pain Score:   6 Pain Location: Back Pain Orientation: Lower  Objective:   Exercise/Treatments Stretches Lower Trunk Rotation: 5 reps;10 seconds Lumbar Exercises Scapular Retraction: 10 reps;Standing;Theraband;Limitations Theraband Level (Scapular Retraction): Level 2 (Red) Scapular Retraction Limitations: with manual assistance for proper technique and vc-ing for posture; scapular retraction standing with emphasis on posture 10 reps Row: 10 reps;Standing;Theraband Theraband Level (Row): Level 2 (Red) Shoulder Extension: 10 reps;Standing;Theraband Theraband Level (Shoulder Extension): Level 2 (Red) Stability Clam: Supine;10 reps;3 seconds Bridge: 10 reps Bent Knee Raise: Supine;10 reps;3 seconds Ab Set: Supine;Limitations AB Set Limitations: with bent knee raise Machine Exercises Tread Mill: 6' @ 1.2 with cueing for posture  Standing Other Standing Knee Exercises: glut sets standing with cueing for posture 10x 5"    Physical Therapy Assessment and Plan PT Assessment and Plan Clinical Impression Statement: Began tband exercises for postural strengthening, pt required manual assistance for proper form/technique and vc-ing for posture with all standing exercises.  Pt able to complete all mat activities correclty with mod cueing for stability. PT Plan: NO MODALITIES. Continue with current POC, progress core strengthening and posture, TM for endurance/posture. Begin isometric hip flexion next session. Resume prone exercises and balance activites.   Goals    Problem  List Patient Active Problem List  Diagnoses  . Lumbago  . Lumbar spondylosis    PT - End of Session Activity Tolerance: Patient tolerated treatment well General Behavior During Session: [facility] for tasks performed Cognition: [facility] for tasks performed  Juel Burrow, PTA 05/13/2011, 6:15 PM

## 2011-05-16 ENCOUNTER — Ambulatory Visit (HOSPITAL_COMMUNITY)
Admission: RE | Admit: 2011-05-16 | Discharge: 2011-05-16 | Disposition: A | Discharge status: Home / Self Care | Payer: Medicare Other | Source: Ambulatory Visit | Attending: Pulmonary Disease | Admitting: Pulmonary Disease

## 2011-05-16 NOTE — Progress Notes (Signed)
Physical Therapy Treatment Patient Details  Name: Gregory Rangel MRN: [id number] Date of Birth: May 19, 1933  Today's Date: 05/16/2011 Time: 4098-1191 Time Calculation (min): 38 min Charges: 30' manual, 8' TE Visit#: 4  of 12   Re-eval: 06/08/11    Subjective: Symptoms/Limitations Symptoms: Pt comes in today in significant pain.  "I was about to die after the treatment on Friday.  Then I did a lot of walking at a flea market on Saturday.  I had to take a lot of Tylenol to get through the day." Pain Assessment Currently in Pain?: Yes Pain Score:   6 Pain Location: Back  Exercise/Treatments Stretches Single Knee to Chest Stretch: 3 reps;30 seconds;Limitations Single Knee to Chest Stretch Limitations: BLE Lower Trunk Rotation: 5 reps;10 seconds Stability Bridge: 5 reps;Limitations Bridge Limitations: w/manual assistance Machine Exercises Tread Mill: Held for pain  Manual Therapy Manual Therapy: Other (comment) Other Manual Therapy: Manual therapy w/STM with pt in R and L sidelying with back stretch (top knee flexed and over top of bottom, bottom knee straight) in order to decrease muscle spasms.   SCS to lumbar region with pt in sidelying and IR hip repeated several times. x30'    Physical Therapy Assessment and Plan PT Assessment and Plan Clinical Impression Statement: Today's treatment focused on decreasing overall pain using manual techniques followed by low level ROM exercise. Pt had significant decrease in muscle spasms and reported significant reduction in pain after treatment today.  Continues to demonstrate weak core stabiliation muscles and decreased pelvic mobility.  PT Plan: NO MODALITIES. Continue to decreae overall pain with use of manual techniques and exercise.  then progress back to core stabilization.  Hold off on TM until patient has a decrease in pain.     Goals    Problem List Patient Active Problem List  Diagnoses  . Lumbago  . Lumbar spondylosis   Aloura Matsuoka 05/16/2011, 4:34 PM

## 2011-05-18 ENCOUNTER — Ambulatory Visit (HOSPITAL_COMMUNITY)
Admission: RE | Admit: 2011-05-18 | Discharge: 2011-05-18 | Disposition: A | Discharge status: Home / Self Care | Payer: Medicare Other | Source: Ambulatory Visit | Attending: Pulmonary Disease | Admitting: Pulmonary Disease

## 2011-05-18 NOTE — Progress Notes (Signed)
Physical Therapy Treatment Patient Details  Name: Gregory Rangel MRN: [id number] Date of Birth: 1933-11-09  Today's Date: 05/18/2011 Time: 4098-1191 Time Calculation (min): 42 min Visit#: 5  of 12   Re-eval: 06/08/11 Charges:  therex 26'  , manual 12'    Subjective: Symptoms/Limitations Symptoms: Pt. states he's still in significant pain, more on L lumbar than R.  Pt. states he is scared to do too much because the screws might come out; reassured him this would not happen. Pain Assessment Currently in Pain?: Yes Pain Score:   6 Pain Location: Back Pain Orientation: Lower   Exercise/Treatments Stretches Single Knee to Chest Stretch: 3 reps;30 seconds;Limitations Single Knee to Chest Stretch Limitations: BLE Lower Trunk Rotation: 5 reps;10 seconds;Limitations Lower Trunk Rotation Limitations: seated and supine Prone Mid Back Stretch: 3 reps;30 seconds;Limitations Prone Mid Back Stretch Limitations: done in seated position Stability Bridge: 5 reps;Limitations Bridge Limitations: w/manual assistance Machine Exercises Tread Mill: Held for pain  Manual Therapy Other Manual Therapy: STM to B lumbar with pt. in L sidelying X 12'  Physical Therapy Assessment and Plan PT Assessment and Plan Clinical Impression Statement: Pt. reported overall reduction of pain in R lumbar area but not L.  Pt. with significant palpable reduction of spasm B lumbar after massage.  Added seated stretches for mid back and lumbar rotation.  Pt. very rigid with rotation, requirng AA. PT Plan: Attempt STM with pt. R sidelying next visit so can concentrate more on L side.     Problem List Patient Active Problem List  Diagnoses  . Lumbago  . Lumbar spondylosis    PT - End of Session Activity Tolerance: Patient tolerated treatment well General Behavior During Session: [facility] for tasks performed Cognition: [facility] for tasks performed   Gracen Ringwald B. Bascom Levels, PTA 05/18/2011, 3:19 PM

## 2011-05-20 ENCOUNTER — Ambulatory Visit (HOSPITAL_COMMUNITY)
Admission: RE | Admit: 2011-05-20 | Discharge: 2011-05-20 | Disposition: A | Discharge status: Home / Self Care | Payer: Medicare Other | Source: Ambulatory Visit | Attending: Pulmonary Disease | Admitting: Pulmonary Disease

## 2011-05-20 NOTE — Progress Notes (Signed)
Physical Therapy Treatment Patient Details  Name: Gregory Rangel MRN: [id number] Date of Birth: 1933/05/03  Today's Date: 05/20/2011 Time: 1430-1520 Time Calculation (min): 50 min Visit#: 6  of 12   Re-eval: 06/08/11  Charge: therex:38 min STM 12 min  Subjective: Symptoms/Limitations Symptoms: Pt reported he is still in significant pain lumbar equal L and R.   Pain Assessment Currently in Pain?: Yes Pain Score:   5 Pain Location: Back Pain Orientation: Lower  Objective:   Exercise/Treatments Stretches Passive Hamstring Stretch: 3 reps;30 seconds Single Knee to Chest Stretch: 3 reps;30 seconds;Limitations Single Knee to Chest Stretch Limitations: BLE with towel AA Lower Trunk Rotation: Limitations Lower Trunk Rotation Limitations: supine 10x 10" Prone Mid Back Stretch: 3 reps;30 seconds;Limitations Prone Mid Back Stretch Limitations: done in seated position Lumbar Exercises Scapular Retraction: 10 reps;Standing;Limitations Scapular Retraction Limitations: 10 reps posterior shoulder rolls for posture. Stability Bridge: 5 reps;Limitations Bridge Limitations: w/manual assistance and glut sets Machine Exercises Tread Mill: 6' @ 1.2 with max cueing for posture.  Stretches Passive Hamstring Stretch: 3 reps;30 seconds  Physical Therapy Assessment and Plan PT Assessment and Plan Clinical Impression Statement: Resumed TM with no verbal c/o but did show painful facial expressions.  Max cueing for posture with gait and all standing exercises.  Pt reported overall reduction of pain in R lumbar but no change L side.   PT Plan: Continue STM to reduce lumbar R>L spams/pain.  Also continue with posture therex.    Goals    Problem List Patient Active Problem List  Diagnoses  . Lumbago  . Lumbar spondylosis    PT - End of Session Activity Tolerance: Patient tolerated treatment well;Patient limited by pain General Behavior During Session: [facility] for tasks performed Cognition:  [facility] for tasks performed  Juel Burrow, PTA 05/20/2011, 3:19 PM

## 2011-05-23 ENCOUNTER — Ambulatory Visit (HOSPITAL_COMMUNITY)
Admission: RE | Admit: 2011-05-23 | Discharge: 2011-05-23 | Disposition: A | Discharge status: Home / Self Care | Payer: Medicare Other | Source: Ambulatory Visit | Attending: Pulmonary Disease | Admitting: Pulmonary Disease

## 2011-05-23 NOTE — Progress Notes (Signed)
Physical Therapy Treatment Patient Details  Name: Gregory Rangel MRN: [id number] Date of Birth: 07/11/1933  Today's Date: 05/23/2011 Time: 4098-1191 Time Calculation (min): 43 min Visit#: 7  of 12   Re-eval: 06/08/11 Charges:  therex 28', massage 12'    Subjective: Symptoms/Limitations Symptoms: Pt. states he can't tell much of a difference so far; reports he raked his yard for 30 minutes on Saturday and it aggrevated his pain alot. Pain Assessment Currently in Pain?: Yes Pain Score:   8 Pain Location: Back Pain Orientation: Left;Lower   Exercise/Treatments Stretches Passive Hamstring Stretch: 3 reps;30 seconds Single Knee to Chest Stretch: 3 reps;30 seconds;Limitations Single Knee to Chest Stretch Limitations: BLE with towel AA Lower Trunk Rotation: Limitations Lower Trunk Rotation Limitations: supine 10x 10" Prone Mid Back Stretch: 3 reps;30 seconds;Limitations Prone Mid Back Stretch Limitations: done in seated position Stability Bent Knee Raise: Supine;10 reps;3 seconds Machine Exercises Tread Mill: 5' @ 1.31mph with max cueing for posture.  Manual Therapy Other Manual Therapy: STM to L lumbar in R sidelying X 12'  Physical Therapy Assessment and Plan PT Assessment and Plan Clinical Impression Statement: Able to complete treadmill without c/o pain.  Pt. reported no pain reduction following session/manual work despite multiple spasms resolved in L lumbar paraspinal region. PT Plan: Continue POC; may attempt prone position for manual/increase extension.     Problem List Patient Active Problem List  Diagnoses  . Lumbago  . Lumbar spondylosis    PT - End of Session Activity Tolerance: Patient tolerated treatment well General Behavior During Session: [facility] for tasks performed Cognition: [facility] for tasks performed    Emmamae Mcnamara B. Bascom Levels, PTA 05/23/2011, 3:56 PM

## 2011-05-25 ENCOUNTER — Ambulatory Visit (HOSPITAL_COMMUNITY)
Admission: RE | Admit: 2011-05-25 | Discharge: 2011-05-25 | Disposition: A | Discharge status: Home / Self Care | Payer: Medicare Other | Source: Ambulatory Visit | Attending: Pulmonary Disease | Admitting: Pulmonary Disease

## 2011-05-25 NOTE — Progress Notes (Signed)
Physical Therapy Treatment Patient Details  Name: Gregory Rangel MRN: [id number] Date of Birth: 1933/12/26  Today's Date: 05/25/2011 Time: 9562-1308 Time Calculation (min): 48 min Visit#: 8  of 12   Re-eval: 06/08/11 Charges:  Manual 12', Therex 8', massage 15'    Subjective: Symptoms/Limitations Symptoms: Pt. states his pain is the same today; states he really wants to get back on his tractor and plant a garden.  Pt. reports he is doing a few stretches at home. Pain Assessment Currently in Pain?: Yes Pain Score:   8 Pain Location: Back Pain Orientation: Left;Right;Lower (left hurts more than right)   Exercise/Treatments Stretches Passive Hamstring Stretch: 3 reps;30 seconds Single Knee to Chest Stretch: 3 reps;30 seconds;Limitations Single Knee to Chest Stretch Limitations: PROM Lower Trunk Rotation: Limitations Lower Trunk Rotation Limitations: supine 5 reps with manual assist to keep hip down Machine Exercises Tread Mill: 6' @ 1.71mph with max cueing for posture.  Manual Therapy Manual Therapy: Massage Other Manual Therapy: STM to L lumbar in prone X 15 minutes  Physical Therapy Assessment and Plan PT Assessment and Plan Clinical Impression Statement: Able to increase treadmill speed today without difficulty requiring VC's for larger steps/no shuffling and posture.  Lumbar rotation very limited.  Pt. able to transfer to prone for STM to lumbar area today to promote extension.  Pt. encouraged to get into prone postion at home.  Pt. with 2 spasms in L lumbar area resolved 50% with STM.  PT. reported overall improvement following session with notably improved standing/ambulation posture. PT Plan: Resume theraband for postural/core strengthening next visit.     Problem List Patient Active Problem List  Diagnoses  . Lumbago  . Lumbar spondylosis    PT - End of Session Activity Tolerance: Patient tolerated treatment well General Behavior During Session: [facility] for tasks  performed Cognition: [facility] for tasks performed     B. Bascom Levels, PTA 05/25/2011, 3:38 PM

## 2011-05-27 ENCOUNTER — Ambulatory Visit (HOSPITAL_COMMUNITY)
Admission: RE | Admit: 2011-05-27 | Discharge: 2011-05-27 | Disposition: A | Discharge status: Home / Self Care | Payer: Medicare Other | Source: Ambulatory Visit | Attending: Pulmonary Disease | Admitting: Pulmonary Disease

## 2011-05-27 NOTE — Progress Notes (Signed)
Physical Therapy Treatment Patient Details  Name: Gregory Rangel MRN: [id number] Date of Birth: Feb 06, 1934  Today's Date: 05/27/2011 Time: 4098-1191 Time Calculation (min): 58 min Visit#: 9  of 12   Re-eval: 06/08/11  Charge: gait 8 min therex 30 min STM 10 min MHP 10 min  Subjective: Symptoms/Limitations Symptoms: Yesterday my lower back was a 10/10, feeling better today pain scale 5/10. Pain Assessment Currently in Pain?: Yes Pain Score:   5 Pain Location: Back Pain Orientation: Lower  Objective:   Exercise/Treatments Stretches Prone on Elbows Stretch: 2 reps;30 seconds Lumbar Exercises Scapular Retraction: 10 reps;Standing;Limitations Theraband Level (Scapular Retraction): Level 3 (Green) Scapular Retraction Limitations: 10 reps posterior shoulder rolls for posture. Row: 10 reps;Standing;Theraband Theraband Level (Row): Level 3 (Green) Shoulder Extension: 10 reps;Standing;Theraband Theraband Level (Shoulder Extension): Level 3 (Green) Machine Exercises Tread Mill: 6' @ 1.24mph with max cueing for posture.  Modalities Modalities: Moist Heat Manual Therapy Manual Therapy: Massage Other Manual Therapy: STM to L lumbar in prone X 10 minutes Moist Heat Therapy Number Minutes Moist Heat: 10 Minutes Moist Heat Location:  (back in sitting position)  Physical Therapy Assessment and Plan PT Assessment and Plan Clinical Impression Statement: Max cueing required with gait for posture and to increase stride length.  Resumed tband exercises with vcing for posture.  Instructed prone on elbows to encourage extension, pt able to complete and stated LB relief with position.  Pt encouraged to begin laying prone at home.  Lumbar rotation does continue to be limited bilateral.  Able to resolve lumbar spasms with STM but pt did state increased pain so ended session with MHP, pt stated pain reduced following MHP. PT Plan: Continue postural and core strengthening.  Next session will be  10th for Medicare purposes.    Goals    Problem List Patient Active Problem List  Diagnoses  . Lumbago  . Lumbar spondylosis    PT - End of Session Activity Tolerance: Patient tolerated treatment well General Behavior During Session: [facility] for tasks performed Cognition: [facility] for tasks performed  Juel Burrow, PTA 05/27/2011, 3:35 PM

## 2011-05-30 ENCOUNTER — Ambulatory Visit (HOSPITAL_COMMUNITY)
Admission: RE | Admit: 2011-05-30 | Discharge: 2011-05-30 | Disposition: A | Discharge status: Home / Self Care | Payer: Medicare Other | Source: Ambulatory Visit | Attending: Pulmonary Disease | Admitting: Pulmonary Disease

## 2011-05-30 NOTE — Progress Notes (Signed)
Physical Therapy Treatment Patient Details  Name: Gregory Rangel MRN: [id number] Date of Birth: 11-Jul-1933  Today's Date: 05/30/2011 Time: 7829-5621 Time Calculation (min): 40 min Visit#: 10  of 12   Re-eval: 06/08/11  Charge: therex 19 min Manual 15 min  Subjective: Symptoms/Limitations Symptoms: Pain scale 7/10 at entrance, pt reported did no HEP this weekend due to the high pain scale.   Pain Assessment Currently in Pain?: Yes Pain Score:   7 Pain Location: Back Pain Orientation: Lower;Left  Objective:   Exercise/Treatments Stretches Passive Hamstring Stretch: 3 reps;30 seconds Lower Trunk Rotation: 5 reps;10 seconds Prone on Elbows Stretch: 2 reps;30 seconds Stability Bridge: 5 reps;Limitations Bridge Limitations: limited by pain with extension Machine Exercises Tread Mill: 6' @ 1.82mph with max cueing for posture.  Manual Therapy Other Manual Therapy: STM to L lumbar in prone X 15 minutes  Physical Therapy Assessment and Plan PT Assessment and Plan Clinical Impression Statement: Better gait mechanics noted with less cueing for posture ambulating on TM.  Pt continues to be limited lumbar ROM by pain.  Able to resolve 1 lumbar spasm with pain reduced to 5/10 with STM at end of session. PT Plan: Continue with postural and core strengthening, begin sidelying TA contractions for core stability.  Reassess in 2 more sessions.    Goals    Problem List Patient Active Problem List  Diagnoses  . Lumbago  . Lumbar spondylosis    PT - End of Session Activity Tolerance: Patient tolerated treatment well General Behavior During Session: [facility] for tasks performed Cognition: [facility] for tasks performed  Juel Burrow, PTA 05/30/2011, 3:35 PM

## 2011-06-01 ENCOUNTER — Ambulatory Visit (HOSPITAL_COMMUNITY): Payer: Medicare Other

## 2011-06-01 DIAGNOSIS — N19 Unspecified kidney failure: Secondary | ICD-10-CM | POA: Diagnosis not present

## 2011-06-01 DIAGNOSIS — R609 Edema, unspecified: Secondary | ICD-10-CM | POA: Diagnosis not present

## 2011-06-01 DIAGNOSIS — I10 Essential (primary) hypertension: Secondary | ICD-10-CM | POA: Diagnosis not present

## 2011-06-02 ENCOUNTER — Ambulatory Visit (HOSPITAL_COMMUNITY)
Admission: RE | Admit: 2011-06-02 | Discharge: 2011-06-02 | Disposition: A | Discharge status: Home / Self Care | Payer: Medicare Other | Source: Ambulatory Visit | Attending: Pulmonary Disease | Admitting: Pulmonary Disease

## 2011-06-02 NOTE — Progress Notes (Signed)
Physical Therapy Treatment Patient Details  Name: Gregory Rangel MRN: [id number] Date of Birth: [birth year]  Today's Date: 06/02/2011 Time: 4098-1191 Time Calculation (min): 45 min Charges: 35' NMR, 10 self care  Visit#: 11  of 12   Re-eval: 06/08/11   Subjective: Symptoms/Limitations Symptoms: I am feeling really sore after last time.  I could hardly sleep last night.  Pain Assessment Currently in Pain?: Yes Pain Score:   7 (w/ambulation)  Exercise/Treatments Standing Gait Training: Around  gym 3 RT to assess pain w/cane Seated Other Seated Knee Exercises: NMR to activate TrA via PF contraction; NMR to encourage multifdus training using VC's, visual cues and TC's.  best results with looking to side, pulling up PF and raising contralateral arm.  Other Seated Knee Exercises: Heel/Toe Roll in and outs (used ball with heel outs) 5x10 sec each direction Sidelying Other Sidelying Knee Exercises: NMR to activate TrA via PF contraction.  Able to hold 3x10 sec after training on L S/L only.  Education: Educated pt thoroughly on the role of the PFM, TrA and multifidus muscles for core stability.    Physical Therapy Assessment and Plan PT Assessment and Plan Clinical Impression Statement: TodayImproved gait mechanics entering today.  Decreaed pain to 4/10 after stabilization exercises today.  Reports improved motor control to PF muscles with seated activities. PT Plan: F/U on core exercises.  Re-eval next visit.     Problem List Patient Active Problem List  Diagnoses  . Lumbago  . Lumbar spondylosis    PT Plan of Care PT Home Exercise Plan: updated see scanned report (PF contraction and Heel/Toe roll in and outs) Consulted and Agree with Plan of Care: Patient  GP No functional reporting required  [name] 06/02/2011, 3:47 PM

## 2011-06-03 ENCOUNTER — Ambulatory Visit (HOSPITAL_COMMUNITY)
Admission: RE | Admit: 2011-06-03 | Discharge: 2011-06-03 | Disposition: A | Discharge status: Home / Self Care | Payer: Medicare Other | Source: Ambulatory Visit | Attending: Pulmonary Disease | Admitting: Pulmonary Disease

## 2011-06-03 NOTE — Progress Notes (Signed)
Physical Therapy Re-Evaluation  Patient Details  Name: Gregory Rangel MRN: [id number] Date of Birth: 1933-11-28  Today's Date: 06/03/2011 Time: 4098-1191 Time Calculation (min): 38 min Charges: 10' TE, 28' Self Care Visit#: 12  of 20   Re-eval: 07/03/11   Subjective Symptoms/Limitations Symptoms: My pain is about 5/10 for 50% of his day.  Today it is feeling pretty good.  It is not as bad as it has been, my lower belly is a little sore from yesterday.  How long can you sit comfortably?: 90 minutes (30 minutes) How long can you stand comfortably?: 15 minutes independently (5 minutes while leaning on a counter or cane) How long can you walk comfortably?: 30 minutes w/SPC (was 15 minutes with SPC) Pain Assessment Currently in Pain?: Yes Pain Score:   3 Pain Location: Back  Cognition/Observation Observation/Other Assessments Observations: Decreased kyphosis  Sensation/Coordination/Flexibility/Functional Tests Functional Tests Functional Tests: ODI: 42%  Assessment RLE Strength Right Hip Flexion: 5/5 (was 3/5.  Some pain to back) Right Hip ADduction: 5/5 (was 2+/5) Right Knee Flexion: 5/5 (was 4/5) Right Knee Extension: 5/5 (was 3+/5) LLE Strength Left Hip Flexion: 5/5 (was 3/5) Left Hip ABduction: 5/5 (was 2/5) Left Hip ADduction: 5/5 (2/5) Left Knee Flexion: 5/5 (was 4/5) Left Knee Extension: 5/5 (was 4/5) Lumbar Strength Overall Lumbar Strength Comments: Able to control TrA via PF contraction.   Exercise/Treatments Standing Gait Training: To improve posture and promote heel strike x8 minutes independent Seated Other Seated Knee Exercises: Pelvic Floor Contraction 5x10 sec holds w/visual cueing for R TrA. Other Seated Knee Exercises: Toe Roll outs with T-band 5x10 sec; Heel Roll outs w/ball 5x10 sec  Physical Therapy Assessment and Plan PT Assessment and Plan Clinical Impression Statement: Mr. Negan has attended 12 OP PT visits and has made significant progress  towards his goals.  He has met 4/5 STG and  1/5 LTG and making significant progress towards his LTG.  He has made significant improvements with his overall LE strength and core control, independent ambulation in a closed environment, posture and flexibiliy.  he continues to have impairments of mild increased pain, increased fear of independent ambulation outdoors, cueing for apporpriate posture with ambulation, mild muscle spasms to his low back and moderate impaired percieved functional ability (42% ODI was 66%).  Pt will continue to benefit from skilled OP PT in order to address above remaining impairments in order to maximize function and independence.  Rehab Potential: Good PT Frequency: Min 2X/week PT Duration: 4 weeks PT Treatment/Interventions: Gait training;Stair training;Functional mobility training;Therapeutic activities;Therapeutic exercise;Balance training;Neuromuscular re-education;Patient/family education;Other (comment) (Spinal Joint Mobs, Manual techniques, Modalities) PT Plan: Cont to progress core strength with functional movements.     Goals Home Exercise Program Pt will Perform Home Exercise Program: Independently PT Short Term Goals Time to Complete Short Term Goals: 2 weeks PT Short Term Goal 1: Pt will report pain less than or equal to 5/10 for 50% of his day. PT Short Term Goal 1 - Progress: Met PT Short Term Goal 2: Pt will improve his core strength by 1 muscle grade. PT Short Term Goal 3: Pt will present with decreased muscular adhesions to his lumbosacral and gluteal region.  PT Short Term Goal 4: Pt will improve his LE endurance and ambulate 2 minutes independently.  PT Short Term Goal 4 - Progress: Met PT Long Term Goals Time to Complete Long Term Goals: 4 weeks PT Long Term Goal 1: Pt will report pain less than 3/10 for 75% of his day  for improved QOL PT Long Term Goal 1 - Progress: Progressing toward goal PT Long Term Goal 2: Pt will imrpove his ODI score to less  than or equal to 45% for improved QOL. Long Term Goal 3: Pt will improve his core strength by 2 muscle grades in order to tolerate standing for 10 minutes in order to complete household activities. Long Term Goal 3 Progress: Met Long Term Goal 4: Pt will improve LE flexibility and endurance in order to tolerate ambulating independently x15 minutes in order to participate in community activities.  PT Long Term Goal 5: Pt will improve his dynamic balance and ambulate independently on grass x100 feet in order to safely return to outdoor hobbies.  Long Term Goal 5 Progress: Not met  Problem List Patient Active Problem List  Diagnoses  . Lumbago  . Lumbar spondylosis      Taige Housman 06/03/2011, 3:20 PM  Physician Documentation Your signature is required to indicate approval of the treatment plan as stated above.  Please sign and either send electronically or make a copy of this report for your files and return this physician signed original.   Please mark one 1.__approve of plan  2. ___approve of plan with the following conditions.   ______________________________                                                          _____________________ Physician Signature                                                                                                             Date

## 2011-06-10 ENCOUNTER — Ambulatory Visit (HOSPITAL_COMMUNITY)
Admission: RE | Admit: 2011-06-10 | Discharge: 2011-06-10 | Disposition: A | Discharge status: Home / Self Care | Payer: Medicare Other | Source: Ambulatory Visit | Attending: Pulmonary Disease | Admitting: Pulmonary Disease

## 2011-06-10 NOTE — Progress Notes (Signed)
Physical Therapy Treatment Patient Details  Name: Gregory Rangel MRN: [id number] Date of Birth: 07/16/1933  Today's Date: 06/10/2011 Time: 1350-1430 Time Calculation (min): 40 min Charges: 61' TE Visit#: 13  of 20   Re-eval: 07/03/11    Subjective: Symptoms/Limitations Symptoms: Pt reports that he has had difficulty doing some of his exercises at home.  Educated pt today that he can continue with his PFC while sitting.   Pain Assessment Pain Score:   7 Pain Location: Back  Precautions/Restrictions     Exercise/Treatments Stability Bridge: 10 reps Bridge Limitations: w/PFC and TrA contracti9on Bent Knee Raise: 5 reps (BLE w/ab set) Ab Set: 10 reps;3 seconds AB Set Limitations: head lifts w/ab set 10x (legs elevated to decrease edema) Hip Abduction: Limitations Hip Abduction Limitations: Hip Adduction Isometrics 10x10 sec holds Leg Elevation with ankle pumps x7 minutes to decrease swelling.   Physical Therapy Assessment and Plan PT Assessment and Plan Clinical Impression Statement: Pt continues to require therapy as he demonstrated difficulty continuing his HEP.  may continue to have increased LBP secondary to significant pitting edema in his LE.  Today he was able to independently demonstrate an approrpate PFC and TrA contraction w/dynamic LE movements.  PT Treatment/Interventions: Gait training;Stair training;Functional mobility training;Therapeutic activities;Therapeutic exercise;Balance training;Neuromuscular re-education;Patient/family education;Other (comment) (Spinal Joint Mobs, Manual techniques NO Modalities) PT Plan: Cont to progress    Goals    Problem List Patient Active Problem List  Diagnoses  . Lumbago  . Lumbar spondylosis   Donica Derouin 06/10/2011, 2:34 PM

## 2011-06-14 ENCOUNTER — Ambulatory Visit (HOSPITAL_COMMUNITY)
Admission: RE | Admit: 2011-06-14 | Discharge: 2011-06-14 | Disposition: A | Discharge status: Home / Self Care | Payer: Medicare Other | Source: Ambulatory Visit | Attending: Neurosurgery | Admitting: Neurosurgery

## 2011-06-14 DIAGNOSIS — M6281 Muscle weakness (generalized): Secondary | ICD-10-CM | POA: Diagnosis not present

## 2011-06-14 DIAGNOSIS — M545 Low back pain, unspecified: Secondary | ICD-10-CM | POA: Diagnosis not present

## 2011-06-14 DIAGNOSIS — R269 Unspecified abnormalities of gait and mobility: Secondary | ICD-10-CM | POA: Diagnosis not present

## 2011-06-14 DIAGNOSIS — IMO0001 Reserved for inherently not codable concepts without codable children: Secondary | ICD-10-CM | POA: Insufficient documentation

## 2011-06-14 NOTE — Progress Notes (Signed)
Physical Therapy Treatment Patient Details  Name: Gregory Rangel MRN: [id number] Date of Birth: 22-Jan-1934  Today's Date: 06/14/2011 Time: 4098-1191 Time Calculation (min): 52 min Visit#: 14  of 20   Re-eval: 07/03/11  Charge: gait 8 min therex 44 min  Subjective: Symptoms/Limitations Symptoms: Pt reports that he has had increased back pain over weekend, reported has not completed HEP this weekend. Pain Assessment Currently in Pain?: Yes Pain Score:   5 Pain Location: Back Pain Orientation: Lower  Objective:   Exercise/Treatments Stretches Prone on Elbows Stretch: 2 reps;30 seconds Stability Bridge: 10 reps Bridge Limitations: w/PFC and TrA contracti9on Ab Set: 10 reps;Limitations AB Set Limitations: PFC 10 " holds supine Heel Squeeze: Prone;10 reps;5 seconds Leg Raise: Prone;10 reps Machine Exercises Tread Mill: 8' @ 2.0 with cueing to step lightly and for posture.     Physical Therapy Assessment and Plan PT Assessment and Plan Clinical Impression Statement: Pt continues to required cueing with HEP for proper technique/purpose of exercise.  Pt able to independently demonstrate appropriate TrA contraction in supine position with gentle slow contraction with no substitution, symmetrical with min cueing for appropriate breathing today, pt did report practicing this exercise at home.. PT Plan: Continue progressing overall functional strength and continue encouraging pt to participate in HEP with correct technique.    Goals    Problem List Patient Active Problem List  Diagnoses  . Lumbago  . Lumbar spondylosis    PT - End of Session Activity Tolerance: Patient tolerated treatment well General Behavior During Session: [facility] for tasks performed Cognition: [facility] for tasks performed  GP No functional reporting required  Juel Burrow, PTA 06/14/2011, 1:53 PM

## 2011-06-16 ENCOUNTER — Ambulatory Visit (HOSPITAL_COMMUNITY)
Admission: RE | Admit: 2011-06-16 | Discharge: 2011-06-16 | Disposition: A | Discharge status: Home / Self Care | Payer: Medicare Other | Source: Ambulatory Visit | Attending: Pulmonary Disease | Admitting: Pulmonary Disease

## 2011-06-16 NOTE — Progress Notes (Signed)
Physical Therapy Treatment Patient Details  Name: Gregory Rangel MRN: [id number] Date of Birth: March 31, 1933  Today's Date: 06/16/2011 Time: 4098-1191 Time Calculation (min): 50 min Visit#: 15  of 20   Re-eval: 07/03/11  Charge: therex 42 min  Subjective: Symptoms/Limitations Symptoms: Pt stated he is feeling better today, had a hard day yesterday though did not complete HEP yesterday.  Pain scale LBP 5/10 Pain Assessment Currently in Pain?: Yes Pain Score:   5 Pain Location: Back Pain Orientation: Lower  Objective:   Exercise/Treatments Stretches Prone on Elbows Stretch: 2 reps;30 seconds Stability Ab Set: 10 reps;Limitations AB Set Limitations: PFC 10 " holds supine Hip Abduction: Limitations;10 reps Hip Abduction Limitations: standing hip extension and abduction Heel Squeeze: Prone;10 reps;5 seconds Single Arm Raise: Limitations Single Arm Raises Limitations: attempted unable Leg Raise: Prone;10 reps Functional Squats: 10 reps Machine Exercises Tread Mill: 8' @ 2.0 with cueing to step lightly and for posture.     Physical Therapy Assessment and Plan PT Assessment and Plan Clinical Impression Statement: Session focus on hip extension strengthening and posture with gait.  Pt continues to require cueing for proper technique with exercises.  Trial with SAR for extension strengthening, unable to complete secondary to arm pain with activity. PT Plan: Continue progressing overall functional strength and continue encouraging pt to participate in HEP with correct technique    Goals    Problem List Patient Active Problem List  Diagnoses  . Lumbago  . Lumbar spondylosis    PT - End of Session Activity Tolerance: Patient tolerated treatment well General Behavior During Session: [facility] for tasks performed Cognition: [facility] for tasks performed  GP No functional reporting required  Juel Burrow, PTA 06/16/2011, 4:26 PM

## 2011-06-21 ENCOUNTER — Ambulatory Visit (HOSPITAL_COMMUNITY)
Admission: RE | Admit: 2011-06-21 | Discharge: 2011-06-21 | Disposition: A | Discharge status: Home / Self Care | Payer: Medicare Other | Source: Ambulatory Visit | Attending: Pulmonary Disease | Admitting: Pulmonary Disease

## 2011-06-21 NOTE — Progress Notes (Signed)
Physical Therapy Treatment Patient Details  Name: Gregory Rangel MRN: [id number] Date of Birth: 08-06-1933  Today's Date: 06/21/2011 Time: 4098-1191 Time Calculation (min): 45 min Visit#: 16  of 20   Re-eval: 07/03/11  Charge: therex 43 min  Subjective: Symptoms/Limitations Symptoms: Pt stated back pain continues, not as bad today. Pain Assessment Currently in Pain?: Yes Pain Score:   5 Pain Location: Back Pain Orientation: Lower  Objective:   Exercise/Treatments Stretches Prone on Elbows Stretch: 2 reps;30 seconds Stability Bridge: 15 reps Bridge Limitations: w/PFC and TrA contracti9on Bent Knee Raise: 10 reps;5 seconds;Supine Ab Set: 10 reps;Limitations AB Set Limitations: PFC 10 " holds supine Straight Leg Raise: Supine;10 reps;Limitations Straight Leg Raises Limitations: with PFC Heel Squeeze: Prone;10 reps;5 seconds Leg Raise: Prone;10 reps Functional Squats: 5 reps;Limitations Functional Squats Limitations: lifting blue ball   Physical Therapy Assessment and Plan PT Assessment and Plan Clinical Impression Statement: Added weight lifting with the functional squats for more functional strengthening to emphasize proper mechanics and form.  Pt with good form noted following demonstration.  Pt continues to require cueing for proper technique with exercises. PT Plan: Continue progressing overall functional strength and continue encouraging pt to participate in HEP with correct technique    Goals    Problem List Patient Active Problem List  Diagnoses  . Lumbago  . Lumbar spondylosis    PT - End of Session Activity Tolerance: Patient tolerated treatment well General Behavior During Session: [facility] for tasks performed Cognition: [facility] for tasks performed  GP No functional reporting required  Juel Burrow, PTA 06/21/2011, 5:38 PM

## 2011-06-23 ENCOUNTER — Ambulatory Visit (HOSPITAL_COMMUNITY)
Admission: RE | Admit: 2011-06-23 | Discharge: 2011-06-23 | Disposition: A | Discharge status: Home / Self Care | Payer: Medicare Other | Source: Ambulatory Visit

## 2011-06-23 NOTE — Progress Notes (Signed)
Physical Therapy Treatment Patient Details  Name: ENDY EASTERLY MRN: [id number] Date of Birth: [birth year]  Today's Date: 06/23/2011 Time: 4098-1191 Time Calculation (min): 56 min Visit#: 17  of 20   Re-eval: 07/03/11  Charge: self care 15 min therex 38 min  Subjective: Symptoms/Limitations Symptoms: Back pain continues, Im sore today rode the lawnmower yesterday think that may be part of the pain. Pain Assessment Currently in Pain?: Yes Pain Score:   5 Pain Location: Back Pain Orientation: Lower  Objective:   Exercise/Treatments Aerobic Tread Mill: 8' @ 2.0 with cueing to step lightly and for posture. Seated Other Seated Knee Exercises: Self care education on purpose/benefits of completing HEP outside of OPPT 15 min Other Seated Knee Exercises: Heel/toe roll in/outs; Pelvic Floor Contraction 10x10 sec holds w/visual cueing for R TrA. Supine Heel Slides: 5 sets;Limitations Heel Slides Limitations: with PFC 5" holds each directions Bridges: 15 reps Other Supine Knee Exercises: Bent knee raise 10 reps with  PFC Other Supine Knee Exercises: PFC 10x 10"  Physical Therapy Assessment and Plan PT Assessment and Plan Clinical Impression Statement: Began session with discussion/education on purpose and benefits of HEP outside of OPPT for back pain, pt able to demonstrate sitting HEP exercises correctly PT Plan: Continue encourageing pt to participate in HEP with correct techniques, progress overall functional strength.    Goals    Problem List Patient Active Problem List  Diagnoses  . Lumbago  . Lumbar spondylosis    PT - End of Session Activity Tolerance: Patient tolerated treatment well General Behavior During Session: [facility] for tasks performed Cognition: [facility] for tasks performed  GP No functional reporting required  Juel Burrow 06/23/2011, 2:00 PM

## 2011-06-28 ENCOUNTER — Ambulatory Visit (HOSPITAL_COMMUNITY)
Admission: RE | Admit: 2011-06-28 | Discharge: 2011-06-28 | Disposition: A | Discharge status: Home / Self Care | Payer: Medicare Other | Source: Ambulatory Visit | Attending: Pulmonary Disease | Admitting: Pulmonary Disease

## 2011-06-28 NOTE — Progress Notes (Signed)
Physical Therapy Treatment Patient Details  Name: Gregory Rangel MRN: [id number] Date of Birth: Jan 02, 1934  Today's Date: 06/28/2011 Time: 7846-9629 Time Calculation (min): 45 min Visit#: 18  of 20   Re-eval: 07/04/11 Charges:  therex 42'    Subjective: Symptoms/Limitations Symptoms: Pt. states his pain remains a 5/10.  States he has been doing his HEP one time a daily Pain Assessment Currently in Pain?: Yes Pain Score:   5 Pain Location: Back Pain Orientation: Lower  Exercises Instructed by Trilby Leaver, SPTA under the direct supervision of Tenishia Ekman Bascom Levels, PTA/CI. Exercise/Treatments Aerobic Tread Mill: 8' @ 2.0 with cueing to step lightly and for posture. Seated Other Seated Knee Exercises: Heel/toe roll in/outs; Pelvic Floor Contraction 10x10 sec holds w/visual cueing for R TrA. Supine Heel Slides: 10 reps Bridges: 15 reps Straight Leg Raises: 10 reps;Both Other Supine Knee Exercises: Bent knee raise 10 reps with  PFC Other Supine Knee Exercises: PFC 10x 10"  Physical Therapy Assessment and Plan PT Assessment and Plan Clinical Impression Statement: Pt. able to complete all exercises without c/o pain.  Required VC's to perform seated heel/toe roll outs correctly and constant VC's for posture/body alignment on treadmill. PT Plan: Re-evaluate X 2 more visits    Problem List Patient Active Problem List  Diagnoses  . Lumbago  . Lumbar spondylosis    PT - End of Session Activity Tolerance: Patient tolerated treatment well General Behavior During Session: [facility] for tasks performed Cognition: [facility] for tasks performed   Winfred Iiams B. Bascom Levels, PTA 06/28/2011, 4:30 PM

## 2011-06-29 DIAGNOSIS — M47817 Spondylosis without myelopathy or radiculopathy, lumbosacral region: Secondary | ICD-10-CM | POA: Diagnosis not present

## 2011-06-29 DIAGNOSIS — M4716 Other spondylosis with myelopathy, lumbar region: Secondary | ICD-10-CM | POA: Diagnosis not present

## 2011-06-30 ENCOUNTER — Ambulatory Visit (HOSPITAL_COMMUNITY)
Admission: RE | Admit: 2011-06-30 | Discharge: 2011-06-30 | Disposition: A | Discharge status: Home / Self Care | Payer: Medicare Other | Source: Ambulatory Visit | Attending: Pulmonary Disease | Admitting: Pulmonary Disease

## 2011-06-30 NOTE — Progress Notes (Signed)
Physical Therapy Treatment Patient Details  Name: Gregory Rangel MRN: [id number] Date of Birth: 12/15/1933  Today's Date: 06/30/2011 Time: 4098-1191 Time Calculation (min): 40 min Visit#: 19  of 20   Re-eval: 07/04/11  Charge: NMR 23 min therex 17 min  Subjective: Symptoms/Limitations Symptoms: Pt stated back feeling little better today, pain scale 5/10. Pain Assessment Currently in Pain?: Yes Pain Score:   5 Pain Location: Back Pain Orientation: Lower  Objective:   Exercise/Treatments Aerobic Tread Mill: 8' @ 2.0-->1.6 (reduced speed for posture) with cueing to step lightly and for posture. Standing Functional Squat: 10 reps;Limitations Functional Squat Limitations: picking up ball off 6 in box Other Standing Knee Exercises: Tandem stance 4x 30"; tandem gait 2 RT, tandem on balance beam 2RT Seated Other Seated Knee Exercises: posterior shoulder rolls 10x, w-back 10x 5" Other Seated Knee Exercises: PFC 10x 10"    Physical Therapy Assessment and Plan PT Assessment and Plan Clinical Impression Statement: Session focus on gait mechanics, balance and posture.  Pt required mod assistance with LOB episodes with cueing for posutre, to slow down and to increase spatial awareness.  Pt tolerated well towards new posture exercises stating LBP decreased at end of session.  PT Plan: Re-eval next session.    Goals    Problem List Patient Active Problem List  Diagnoses  . Lumbago  . Lumbar spondylosis    PT - End of Session Equipment Utilized During Treatment: Gait belt Activity Tolerance: Patient tolerated treatment well General Behavior During Session: [facility] for tasks performed Cognition: [facility] for tasks performed  GP No functional reporting required  Juel Burrow, PTA 06/30/2011, 1:49 PM

## 2011-07-05 ENCOUNTER — Ambulatory Visit (HOSPITAL_COMMUNITY): Payer: Medicare Other | Admitting: Physical Therapy

## 2011-07-05 DIAGNOSIS — L820 Inflamed seborrheic keratosis: Secondary | ICD-10-CM | POA: Diagnosis not present

## 2011-07-07 ENCOUNTER — Ambulatory Visit (HOSPITAL_COMMUNITY)
Admission: RE | Admit: 2011-07-07 | Discharge: 2011-07-07 | Disposition: A | Discharge status: Home / Self Care | Payer: Medicare Other | Source: Ambulatory Visit | Attending: Neurosurgery | Admitting: Neurosurgery

## 2011-07-07 NOTE — Progress Notes (Signed)
Physical Therapy Re-evaluation/Treatment  Patient Details  Name: Gregory Rangel MRN: [id number] Date of Birth: [birth year]  Today's Date: 07/07/2011 Time: 4098-1191 Time Calculation (min): 45 min  Visit#: 20  of 20   Re-eval:   Assessment Diagnosis: Low Back Pain Surgical Date: 12/13/10 Charge: gait 12 min MMT 1 unit ROM measurement 1 unit therex 10 min Self care 10 min  Subjective Symptoms/Limitations Symptoms: Pt stated back feeling better, pain scale still 4-5/10 today. Pain Assessment Currently in Pain?: Yes Pain Score:   4 Pain Location: Back Pain Orientation: Lower;Right  Precautions/Restrictions  Precautions Precautions: Back;Fall Precaution Booklet Issued: Yes (comment) Precaution Comments: PMH: COLON CANCER   Objective:  Cognition/Observation Observation/Other Assessments Observations: Decreased adhesions lumbrosacral/gluteal region  Sensation/Coordination/Flexibility/Functional Tests Functional Tests Functional Tests: ODI: 16% (was ODI: 42%)  Assessment RLE Strength Right Hip Extension: 3/5 (was 2+/5) LLE Strength Left Hip Extension: 3/5 ( was 2+/5) Lumbar AROM Lumbar Flexion: WFL (was decreased 75%) Lumbar Extension: Decreased 90% (was decreased 100%) Lumbar - Right Side Bend: decreased 60% (decreased 90%) Lumbar - Left Side Bend: decreased 80% (was decreased 90%) Lumbar Strength Lumbar Flexion: 2+/5 (was 2/5) Lumbar Extension: 2/5 (was 2/5)  Exercise/Treatments Mobility/Balance  Posture/Postural Control Posture/Postural Control: Postural limitations   Standing Gait Training: Outdoor gait with slopes, curbs, grass no AD x 12 min Sidelying Clams: 5x 10"     Physical Therapy Assessment and Plan PT Assessment and Plan Clinical Impression Statement: Re-eval complete.  Gregory Rangel has had 20 OPPT sessions with the following findings.  Pt has met 3/4 STG, 4/5 LTGs.  Pt reported pain less than 3/10 for 75% of a day with improved quality of  life, pt with improved hip extension and lumbar extension strength, decreased adhesions in lumbrosacral and gluteal regions, improved endurance with ability to ambulate safely on dynamic surfaces with no AD for 30 minutes in community settings, pt with improved perceived functional ability per Oswertry low back pain scale. PT Plan: D/C to HEP.    Goals Home Exercise Program Pt will Perform Home Exercise Program: Independently PT Goal: Perform Home Exercise Program - Progress: Progressing toward goal PT Short Term Goals Time to Complete Short Term Goals: 2 weeks PT Short Term Goal 1: Pt will report pain less than or equal to 5/10 for 50% of his day. PT Short Term Goal 1 - Progress: Met PT Short Term Goal 2: Pt will improve his core strength by 1 muscle grade. PT Short Term Goal 2 - Progress: Progressing toward goal PT Short Term Goal 3: Pt will present with decreased muscular adhesions to his lumbosacral and gluteal region.  PT Short Term Goal 3 - Progress: Met PT Short Term Goal 4: Pt will improve his LE endurance and ambulate 2 minutes independently.  PT Short Term Goal 4 - Progress: Met PT Long Term Goals Time to Complete Long Term Goals: 4 weeks PT Long Term Goal 1: Pt will report pain less than 3/10 for 75% of his day for improved QOL PT Long Term Goal 1 - Progress: Met PT Long Term Goal 2: Pt will imrpove his ODI score to less than or equal to 45% for improved QOL. PT Long Term Goal 2 - Progress: Met Long Term Goal 3: Pt will improve his core strength by 2 muscle grades in order to tolerate standing for 10 minutes in order to complete household activities. Long Term Goal 3 Progress: Met Long Term Goal 4: Pt will improve LE flexibility and endurance in order to tolerate ambulating  independently x15 minutes in order to participate in community activities.  Long Term Goal 4 Progress: Partly met (able to ambulate 15-30 minutes in community) PT Long Term Goal 5: Pt will improve his dynamic  balance and ambulate independently on grass x100 feet in order to safely return to outdoor hobbies.  Long Term Goal 5 Progress: Met  Problem List Patient Active Problem List  Diagnoses  . Lumbago  . Lumbar spondylosis    PT - End of Session Equipment Utilized During Treatment: Gait belt Activity Tolerance: Patient tolerated treatment well General Behavior During Session: [facility] for tasks performed Cognition: [facility] for tasks performed  GP  Functional Reporting Modifier  Discharge Status  [phone] - Mobility: Walking & Moving Around CI - At least 1% but less than 20% impaired, limited or restricted   Juel Burrow, PTA 07/07/2011, 4:34 PM

## 2011-07-14 DIAGNOSIS — E1159 Type 2 diabetes mellitus with other circulatory complications: Secondary | ICD-10-CM | POA: Diagnosis not present

## 2011-07-14 DIAGNOSIS — E119 Type 2 diabetes mellitus without complications: Secondary | ICD-10-CM | POA: Diagnosis not present

## 2011-07-28 DIAGNOSIS — I10 Essential (primary) hypertension: Secondary | ICD-10-CM | POA: Diagnosis not present

## 2011-07-28 DIAGNOSIS — N189 Chronic kidney disease, unspecified: Secondary | ICD-10-CM | POA: Diagnosis not present

## 2011-07-28 DIAGNOSIS — R809 Proteinuria, unspecified: Secondary | ICD-10-CM | POA: Diagnosis not present

## 2011-07-28 DIAGNOSIS — Z79899 Other long term (current) drug therapy: Secondary | ICD-10-CM | POA: Diagnosis not present

## 2011-07-28 DIAGNOSIS — R609 Edema, unspecified: Secondary | ICD-10-CM | POA: Diagnosis not present

## 2011-07-28 DIAGNOSIS — E1129 Type 2 diabetes mellitus with other diabetic kidney complication: Secondary | ICD-10-CM | POA: Diagnosis not present

## 2011-08-22 DIAGNOSIS — Z79899 Other long term (current) drug therapy: Secondary | ICD-10-CM | POA: Diagnosis not present

## 2011-08-22 DIAGNOSIS — G8929 Other chronic pain: Secondary | ICD-10-CM | POA: Diagnosis not present

## 2011-08-22 DIAGNOSIS — M545 Low back pain: Secondary | ICD-10-CM | POA: Diagnosis not present

## 2011-08-22 DIAGNOSIS — IMO0002 Reserved for concepts with insufficient information to code with codable children: Secondary | ICD-10-CM | POA: Diagnosis not present

## 2011-08-23 DIAGNOSIS — M5137 Other intervertebral disc degeneration, lumbosacral region: Secondary | ICD-10-CM | POA: Diagnosis not present

## 2011-08-23 DIAGNOSIS — M545 Low back pain: Secondary | ICD-10-CM | POA: Diagnosis not present

## 2011-08-23 DIAGNOSIS — G894 Chronic pain syndrome: Secondary | ICD-10-CM | POA: Diagnosis not present

## 2011-08-23 DIAGNOSIS — M961 Postlaminectomy syndrome, not elsewhere classified: Secondary | ICD-10-CM | POA: Diagnosis not present

## 2011-08-29 DIAGNOSIS — R42 Dizziness and giddiness: Secondary | ICD-10-CM | POA: Diagnosis not present

## 2011-08-29 DIAGNOSIS — R269 Unspecified abnormalities of gait and mobility: Secondary | ICD-10-CM | POA: Diagnosis not present

## 2011-08-30 DIAGNOSIS — M961 Postlaminectomy syndrome, not elsewhere classified: Secondary | ICD-10-CM | POA: Diagnosis not present

## 2011-08-30 DIAGNOSIS — M1710 Unilateral primary osteoarthritis, unspecified knee: Secondary | ICD-10-CM | POA: Diagnosis not present

## 2011-08-30 DIAGNOSIS — M47817 Spondylosis without myelopathy or radiculopathy, lumbosacral region: Secondary | ICD-10-CM | POA: Diagnosis not present

## 2011-08-30 DIAGNOSIS — M545 Low back pain: Secondary | ICD-10-CM | POA: Diagnosis not present

## 2011-08-30 DIAGNOSIS — M25569 Pain in unspecified knee: Secondary | ICD-10-CM | POA: Diagnosis not present

## 2011-09-07 DIAGNOSIS — M47817 Spondylosis without myelopathy or radiculopathy, lumbosacral region: Secondary | ICD-10-CM | POA: Diagnosis not present

## 2011-09-07 DIAGNOSIS — M1710 Unilateral primary osteoarthritis, unspecified knee: Secondary | ICD-10-CM | POA: Diagnosis not present

## 2011-09-07 DIAGNOSIS — M25569 Pain in unspecified knee: Secondary | ICD-10-CM | POA: Diagnosis not present

## 2011-09-07 DIAGNOSIS — M961 Postlaminectomy syndrome, not elsewhere classified: Secondary | ICD-10-CM | POA: Diagnosis not present

## 2011-09-07 DIAGNOSIS — M545 Low back pain: Secondary | ICD-10-CM | POA: Diagnosis not present

## 2011-09-07 DIAGNOSIS — G894 Chronic pain syndrome: Secondary | ICD-10-CM | POA: Diagnosis not present

## 2011-09-14 DIAGNOSIS — M47817 Spondylosis without myelopathy or radiculopathy, lumbosacral region: Secondary | ICD-10-CM | POA: Diagnosis not present

## 2011-09-14 DIAGNOSIS — G894 Chronic pain syndrome: Secondary | ICD-10-CM | POA: Diagnosis not present

## 2011-09-14 DIAGNOSIS — M545 Low back pain: Secondary | ICD-10-CM | POA: Diagnosis not present

## 2011-09-14 DIAGNOSIS — M5137 Other intervertebral disc degeneration, lumbosacral region: Secondary | ICD-10-CM | POA: Diagnosis not present

## 2011-09-14 DIAGNOSIS — M1710 Unilateral primary osteoarthritis, unspecified knee: Secondary | ICD-10-CM | POA: Diagnosis not present

## 2011-09-14 DIAGNOSIS — M25569 Pain in unspecified knee: Secondary | ICD-10-CM | POA: Diagnosis not present

## 2011-09-14 DIAGNOSIS — M961 Postlaminectomy syndrome, not elsewhere classified: Secondary | ICD-10-CM | POA: Diagnosis not present

## 2011-09-29 DIAGNOSIS — E119 Type 2 diabetes mellitus without complications: Secondary | ICD-10-CM | POA: Diagnosis not present

## 2011-09-29 DIAGNOSIS — E1159 Type 2 diabetes mellitus with other circulatory complications: Secondary | ICD-10-CM | POA: Diagnosis not present

## 2011-10-10 DIAGNOSIS — M47817 Spondylosis without myelopathy or radiculopathy, lumbosacral region: Secondary | ICD-10-CM | POA: Diagnosis not present

## 2011-10-10 DIAGNOSIS — M4716 Other spondylosis with myelopathy, lumbar region: Secondary | ICD-10-CM | POA: Diagnosis not present

## 2011-11-23 DIAGNOSIS — N19 Unspecified kidney failure: Secondary | ICD-10-CM | POA: Diagnosis not present

## 2011-11-23 DIAGNOSIS — M1990 Unspecified osteoarthritis, unspecified site: Secondary | ICD-10-CM | POA: Diagnosis not present

## 2011-11-23 DIAGNOSIS — Z23 Encounter for immunization: Secondary | ICD-10-CM | POA: Diagnosis not present

## 2011-11-23 DIAGNOSIS — E109 Type 1 diabetes mellitus without complications: Secondary | ICD-10-CM | POA: Diagnosis not present

## 2011-11-23 DIAGNOSIS — I10 Essential (primary) hypertension: Secondary | ICD-10-CM | POA: Diagnosis not present

## 2011-12-08 DIAGNOSIS — E1159 Type 2 diabetes mellitus with other circulatory complications: Secondary | ICD-10-CM | POA: Diagnosis not present

## 2011-12-08 DIAGNOSIS — E119 Type 2 diabetes mellitus without complications: Secondary | ICD-10-CM | POA: Diagnosis not present

## 2011-12-12 DIAGNOSIS — L570 Actinic keratosis: Secondary | ICD-10-CM | POA: Diagnosis not present

## 2011-12-12 DIAGNOSIS — L259 Unspecified contact dermatitis, unspecified cause: Secondary | ICD-10-CM | POA: Diagnosis not present

## 2011-12-12 DIAGNOSIS — Z85828 Personal history of other malignant neoplasm of skin: Secondary | ICD-10-CM | POA: Diagnosis not present

## 2011-12-26 DIAGNOSIS — Z23 Encounter for immunization: Secondary | ICD-10-CM | POA: Diagnosis not present

## 2012-01-03 DIAGNOSIS — I509 Heart failure, unspecified: Secondary | ICD-10-CM | POA: Diagnosis not present

## 2012-01-03 DIAGNOSIS — R809 Proteinuria, unspecified: Secondary | ICD-10-CM | POA: Diagnosis not present

## 2012-01-03 DIAGNOSIS — E1129 Type 2 diabetes mellitus with other diabetic kidney complication: Secondary | ICD-10-CM | POA: Diagnosis not present

## 2012-01-03 DIAGNOSIS — I10 Essential (primary) hypertension: Secondary | ICD-10-CM | POA: Diagnosis not present

## 2012-01-03 DIAGNOSIS — E1165 Type 2 diabetes mellitus with hyperglycemia: Secondary | ICD-10-CM | POA: Diagnosis not present

## 2012-02-22 DIAGNOSIS — E109 Type 1 diabetes mellitus without complications: Secondary | ICD-10-CM | POA: Diagnosis not present

## 2012-02-22 DIAGNOSIS — M545 Low back pain, unspecified: Secondary | ICD-10-CM | POA: Diagnosis not present

## 2012-02-22 DIAGNOSIS — I10 Essential (primary) hypertension: Secondary | ICD-10-CM | POA: Diagnosis not present

## 2012-02-22 DIAGNOSIS — F411 Generalized anxiety disorder: Secondary | ICD-10-CM | POA: Diagnosis not present

## 2012-02-23 DIAGNOSIS — E1159 Type 2 diabetes mellitus with other circulatory complications: Secondary | ICD-10-CM | POA: Diagnosis not present

## 2012-02-23 DIAGNOSIS — E119 Type 2 diabetes mellitus without complications: Secondary | ICD-10-CM | POA: Diagnosis not present

## 2012-05-03 DIAGNOSIS — E559 Vitamin D deficiency, unspecified: Secondary | ICD-10-CM | POA: Diagnosis not present

## 2012-05-03 DIAGNOSIS — Z79899 Other long term (current) drug therapy: Secondary | ICD-10-CM | POA: Diagnosis not present

## 2012-05-03 DIAGNOSIS — R809 Proteinuria, unspecified: Secondary | ICD-10-CM | POA: Diagnosis not present

## 2012-05-08 DIAGNOSIS — N184 Chronic kidney disease, stage 4 (severe): Secondary | ICD-10-CM | POA: Diagnosis not present

## 2012-05-08 DIAGNOSIS — I10 Essential (primary) hypertension: Secondary | ICD-10-CM | POA: Diagnosis not present

## 2012-05-08 DIAGNOSIS — R809 Proteinuria, unspecified: Secondary | ICD-10-CM | POA: Diagnosis not present

## 2012-05-08 DIAGNOSIS — I509 Heart failure, unspecified: Secondary | ICD-10-CM | POA: Diagnosis not present

## 2012-05-10 DIAGNOSIS — E1159 Type 2 diabetes mellitus with other circulatory complications: Secondary | ICD-10-CM | POA: Diagnosis not present

## 2012-05-10 DIAGNOSIS — L851 Acquired keratosis [keratoderma] palmaris et plantaris: Secondary | ICD-10-CM | POA: Diagnosis not present

## 2012-05-10 DIAGNOSIS — L609 Nail disorder, unspecified: Secondary | ICD-10-CM | POA: Diagnosis not present

## 2012-05-10 DIAGNOSIS — E119 Type 2 diabetes mellitus without complications: Secondary | ICD-10-CM | POA: Diagnosis not present

## 2012-06-11 DIAGNOSIS — M1710 Unilateral primary osteoarthritis, unspecified knee: Secondary | ICD-10-CM | POA: Diagnosis not present

## 2012-06-12 DIAGNOSIS — L570 Actinic keratosis: Secondary | ICD-10-CM | POA: Diagnosis not present

## 2012-06-12 DIAGNOSIS — L439 Lichen planus, unspecified: Secondary | ICD-10-CM | POA: Diagnosis not present

## 2012-06-12 DIAGNOSIS — Z85828 Personal history of other malignant neoplasm of skin: Secondary | ICD-10-CM | POA: Diagnosis not present

## 2012-06-12 DIAGNOSIS — L820 Inflamed seborrheic keratosis: Secondary | ICD-10-CM | POA: Diagnosis not present

## 2012-06-12 DIAGNOSIS — D485 Neoplasm of uncertain behavior of skin: Secondary | ICD-10-CM | POA: Diagnosis not present

## 2012-06-12 DIAGNOSIS — L259 Unspecified contact dermatitis, unspecified cause: Secondary | ICD-10-CM | POA: Diagnosis not present

## 2012-06-18 DIAGNOSIS — M545 Low back pain, unspecified: Secondary | ICD-10-CM | POA: Diagnosis not present

## 2012-06-18 DIAGNOSIS — E109 Type 1 diabetes mellitus without complications: Secondary | ICD-10-CM | POA: Diagnosis not present

## 2012-06-18 DIAGNOSIS — M1710 Unilateral primary osteoarthritis, unspecified knee: Secondary | ICD-10-CM | POA: Diagnosis not present

## 2012-06-18 DIAGNOSIS — E118 Type 2 diabetes mellitus with unspecified complications: Secondary | ICD-10-CM | POA: Diagnosis not present

## 2012-06-18 DIAGNOSIS — N19 Unspecified kidney failure: Secondary | ICD-10-CM | POA: Diagnosis not present

## 2012-06-18 DIAGNOSIS — I129 Hypertensive chronic kidney disease with stage 1 through stage 4 chronic kidney disease, or unspecified chronic kidney disease: Secondary | ICD-10-CM | POA: Diagnosis not present

## 2012-06-19 DIAGNOSIS — H2510 Age-related nuclear cataract, unspecified eye: Secondary | ICD-10-CM | POA: Diagnosis not present

## 2012-06-19 DIAGNOSIS — Z961 Presence of intraocular lens: Secondary | ICD-10-CM | POA: Diagnosis not present

## 2012-06-19 DIAGNOSIS — E119 Type 2 diabetes mellitus without complications: Secondary | ICD-10-CM | POA: Diagnosis not present

## 2012-06-26 ENCOUNTER — Encounter (HOSPITAL_COMMUNITY): Payer: Self-pay | Admitting: Pharmacy Technician

## 2012-06-26 ENCOUNTER — Other Ambulatory Visit: Payer: Self-pay | Admitting: Orthopedic Surgery

## 2012-06-26 NOTE — Progress Notes (Signed)
Need orders for Gregory Rangel DOS 03/6107 -PT coming for preop  06/27/12  Thank You

## 2012-06-27 ENCOUNTER — Ambulatory Visit (HOSPITAL_COMMUNITY)
Admission: RE | Admit: 2012-06-27 | Discharge: 2012-06-27 | Disposition: A | Discharge status: Home / Self Care | Payer: Medicare Other | Source: Ambulatory Visit | Attending: Specialist | Admitting: Specialist

## 2012-06-27 ENCOUNTER — Ambulatory Visit (HOSPITAL_COMMUNITY)
Admission: RE | Admit: 2012-06-27 | Discharge: 2012-06-27 | Disposition: A | Discharge status: Home / Self Care | Payer: Medicare Other | Source: Ambulatory Visit | Attending: Orthopedic Surgery | Admitting: Orthopedic Surgery

## 2012-06-27 ENCOUNTER — Encounter (HOSPITAL_COMMUNITY)
Admission: RE | Admit: 2012-06-27 | Discharge: 2012-06-27 | Disposition: A | Discharge status: Home / Self Care | Payer: Medicare Other | Source: Ambulatory Visit | Attending: Specialist | Admitting: Specialist

## 2012-06-27 ENCOUNTER — Encounter (HOSPITAL_COMMUNITY): Payer: Self-pay

## 2012-06-27 DIAGNOSIS — Z01818 Encounter for other preprocedural examination: Secondary | ICD-10-CM | POA: Diagnosis not present

## 2012-06-27 DIAGNOSIS — E119 Type 2 diabetes mellitus without complications: Secondary | ICD-10-CM | POA: Diagnosis not present

## 2012-06-27 DIAGNOSIS — R0602 Shortness of breath: Secondary | ICD-10-CM | POA: Insufficient documentation

## 2012-06-27 DIAGNOSIS — M1710 Unilateral primary osteoarthritis, unspecified knee: Secondary | ICD-10-CM | POA: Insufficient documentation

## 2012-06-27 DIAGNOSIS — Z01810 Encounter for preprocedural cardiovascular examination: Secondary | ICD-10-CM | POA: Diagnosis not present

## 2012-06-27 DIAGNOSIS — Z01812 Encounter for preprocedural laboratory examination: Secondary | ICD-10-CM | POA: Diagnosis not present

## 2012-06-27 DIAGNOSIS — I10 Essential (primary) hypertension: Secondary | ICD-10-CM | POA: Diagnosis not present

## 2012-06-27 DIAGNOSIS — R918 Other nonspecific abnormal finding of lung field: Secondary | ICD-10-CM | POA: Diagnosis not present

## 2012-06-27 HISTORY — DX: Type 2 diabetes mellitus without complications: E11.9

## 2012-06-27 HISTORY — DX: Gastro-esophageal reflux disease without esophagitis: K21.9

## 2012-06-27 HISTORY — DX: Shortness of breath: R06.02

## 2012-06-27 HISTORY — DX: Unspecified osteoarthritis, unspecified site: M19.90

## 2012-06-27 HISTORY — DX: Essential (primary) hypertension: I10

## 2012-06-27 LAB — URINALYSIS, ROUTINE W REFLEX MICROSCOPIC
Bilirubin Urine: NEGATIVE
Glucose, UA: 100 mg/dL — AB
Ketones, ur: NEGATIVE mg/dL
Leukocytes, UA: NEGATIVE
pH: 5 (ref 5.0–8.0)

## 2012-06-27 LAB — URINE MICROSCOPIC-ADD ON

## 2012-06-27 LAB — PROTIME-INR: Prothrombin Time: 12.5 seconds (ref 11.6–15.2)

## 2012-06-27 LAB — CBC
Platelets: 223 10*3/uL (ref 150–400)
RBC: 4.88 MIL/uL (ref 4.22–5.81)
WBC: 7.8 10*3/uL (ref 4.0–10.5)

## 2012-06-27 LAB — BASIC METABOLIC PANEL
Calcium: 9.3 mg/dL (ref 8.4–10.5)
GFR calc non Af Amer: 21 mL/min — ABNORMAL LOW (ref >90)
Sodium: 139 mEq/L (ref 135–145)

## 2012-06-27 LAB — SURGICAL PCR SCREEN
MRSA, PCR: NEGATIVE
Staphylococcus aureus: NEGATIVE

## 2012-06-27 NOTE — Patient Instructions (Addendum)
20      Your procedure is scheduled on:  Friday 07/06/2012  Report to [facility] at  1030 AM.  Call this number if you have problems the morning of surgery: [phone]   Remember: DO NOT TAKE ANY DIABETIC MEDICATIONS THE MORNING OF YOUR SURGERY!             IF YOU USE CPAP,BRING MASK AND TUBING AM OF SURGERY!   Do not eat food AFTER MIDNIGHT! MAY HAVE CLEAR LIQUIDS FROM MIDNIGHT UP UNTIL 0700 AM THEN NOTHING UNTIL AFTER SURGERY!  Take these medicines the morning of surgery with A SIP OF WATER: Amlodipine   Do not bring valuables to the hospital.  .  Leave suitcase in the car. After surgery it may be brought to your room.  For patients admitted to the hospital, checkout time is 11:00 AM the day of              Discharge.    DO NOT WEAR JEWELRY , MAKE-UP, LOTIONS,POWDERS,PERFUMES!             WOMEN -DO NOT SHAVE LEGS OR UNDERARMS 12 HRS. BEFORE SURGERY!               MEN MAY SHAVE AS USUAL!             CONTACTS,DENTURES OR BRIDGEWORK, FALSE EYELASHES MAY NOT BE WORN INTO SURGERY!                                           Patients discharged the day of surgery will not be allowed to drive home. If going home the same day of surgery, must have someone stay with you first 24 hrs.at home and arrange for someone to drive you home from the Hospital.                        YOUR DRIVER ZO:[id number]   Special Instructions:             Please read over the following fact sheets that you were given:             1. [place] PREPARING FOR SURGERY SHEET              2.MRSA INFORMATION              3.INCENTIVE SPIROMETRY                                        Telford Nab.Neils [place],RN,BSN     [phone]                FAILURE TO FOLLOW THESE INSTRUCTIONS MAY RESULT IN CANCELLATION OF YOUR SURGERY!               Patient Signature:___________________________

## 2012-06-27 NOTE — Progress Notes (Signed)
06/27/12 1009  OBSTRUCTIVE SLEEP APNEA  Have you ever been diagnosed with sleep apnea through a sleep study? No  Do you snore loudly (loud enough to be heard through closed doors)?  1  Do you often feel tired, fatigued, or sleepy during the daytime? 1  Has anyone observed you stop breathing during your sleep? 0  Do you have, or are you being treated for high blood pressure? 1  BMI more than 35 kg/m2? 0  Age over 77 years old? 1  Neck circumference greater than 40 cm/18 inches? 0  Gender: 1  Obstructive Sleep Apnea Score 5  Score 4 or greater  Results sent to PCP

## 2012-07-03 ENCOUNTER — Other Ambulatory Visit: Payer: Self-pay | Admitting: Orthopedic Surgery

## 2012-07-03 NOTE — H&P (Signed)
Gregory Rangel DOB: 17-May-1933 Married / Language: English / Race: White Male H&P date: 07/03/12  Chief Complaint: Right knee pain  History of Present Illness The patient is a 79 year old male who comes in today for a preoperative History and Physical. The patient is scheduled for a right total knee arthroplasty to be performed by Dr. Javier Docker, MD at [facility] on July 06, 2012. They are now years out from when symptoms began, with ongoing pain, pain after sitting, swelling (intermittently), stiffness, instability, pain with weightbearing, difficulty ambulating and difficulty arising from chair, while the patient does not report symptoms of: pain in calf, locking or giving way. The patient feels that they are doing poorly and report their pain level to be moderate to severe. Current treatment includes: relative rest, activity modification and tylenol as needed. The patient has not gotten any relief of their symptoms with activity modification, conservative measures, Cortisone injections (only 1 month of relief with last injection, January 2013) or rest. He is s/p R knee arthroscopy by Dr. Shelle Iron in June 2011. Following that, he had some short-term relief with steroid injections and viscosupplementation (Euflexxa, 2011). His pain has progressively worsened over the past few years. His most recent steroid injection in January 2013 only seemed to help slightly for about a month. He has had to decrease his activity as it is painful for him to walk, per his wife he is mostly sedentary at this point due to pain. He uses a cane due to instability when walking. He notes stiffness after he sits, and sometimes notes swelling, especially with increased activity. He is unable to take NSAIDs due to kidney function but does take tylenol as needed. His pain is limiting his ADLs.  Dr. Shelle Iron and the pt mutually agreed to proceed with a total knee replacement. Risks and benefits of the procedure  were discussed including stiffness, suboptimal range of motion, persistent pain, infection requiring removal of prosthesis and reinsertion, need for prophylactic antibiotics in the future, for example, dental procedures, possible need for manipulation, revision in the future and also anesthetic complications including DVT, PE, etc. We discussed the perioperative course, time in the hospital, postoperative recovery and the need for elevation to control swelling. We also discussed the predicted range of motion and the probability that squatting and kneeling would be unobtainable in the future. In addition, postoperative anticoagulation was discussed. We have obtained preoperative medical clearance as necessary from Dr. Juanetta Gosling. Provided the illustrated handout and discussed it in detail. They will enroll in the total joint replacement educational forum at the hospital.  Past Medical History Prostate Cancer. s/p prostatectomy Diabetes Mellitus, Type II High blood pressure Deep vein thrombosis. s/p spinal surgery, unknown location Colon Cancer. s/p colectomy Cataract. left; right s/p surgery  Allergies Penicillin G Benzathine & Proc *PENICILLINS*. Swelling.  Family History Cerebrovascular Accident. mother  Social History Tobacco use. former smoker Drug/Alcohol Rehab (Currently). no Drug/Alcohol Rehab (Previously). no Current work status. retired Alcohol use. never consumed alcohol Children. 2 Exercise. Exercises rarely Pain Contract. no Tobacco / smoke exposure. no Marital status. married Illicit drug use. no Living situation. live with spouse, 2 steps into house Advance Directives. none Post-Surgical Plans. home with home health  Medication History GlipiZIDE XL (10MG  Tablet ER 24HR, Oral) Active. Benazepril HCl (40MG  Tablet, Oral) Active. Losartan Potassium (100MG  Tablet, Oral) Active. AmLODIPine Besylate (10MG  Tablet, Oral daily) Active.  Past Surgical  History Cataract Surgery. right Colon Polyp Removal - Colonoscopy Arthroscopy of Knee.  right Prostatectomy; Abdominal. colon CA resection 1998; prostate CA surgery 1999 Spinal Fusion. lower back Spinal Surgery Cholecystectomy  Diagnostic Studies History EKG. 06/25/12 Chest X-ray. 06/25/12  Review of Systems General:Not Present- Chills, Fever, Night Sweats, Fatigue, Weight Gain, Weight Loss and Memory Loss. Skin:Present- Itching. Not Present- Hives, Rash, Eczema and Lesions. HEENT:Present- Ringing in the Ears (occasional). Not Present- Tinnitus, Headache, Double Vision, Visual Loss, Hearing Loss and Dentures. Respiratory:Not Present- Shortness of breath with exertion, Shortness of breath at rest, Allergies, Coughing up blood and Chronic Cough. Cardiovascular:Not Present- Chest Pain, Racing/skipping heartbeats, Difficulty Breathing Lying Down, Murmur, Swelling and Palpitations. Gastrointestinal:Not Present- Bloody Stool, Heartburn, Abdominal Pain, Vomiting, Nausea, Constipation, Diarrhea, Difficulty Swallowing, Jaundice and Loss of appetitie. Male Genitourinary:Not Present- Urinary frequency, Blood in Urine, Weak urinary stream, Discharge, Flank Pain, Incontinence, Painful Urination, Urgency, Urinary Retention and Urinating at Night. Musculoskeletal:Present- Joint Stiffness, Joint Swelling, Joint Pain and Back Pain. Not Present- Muscle Weakness, Muscle Pain, Morning Stiffness and Spasms. Neurological:Not Present- Tremor, Dizziness, Blackout spells, Paralysis, Difficulty with balance and Weakness. Psychiatric:Not Present- Insomnia.  Vitals 07/03/2012 8:55 AM Weight: 240 lb Height: 72 in Body Surface Area: 2.35 m Body Mass Index: 32.55 kg/m Pulse: 75 (Regular) BP: 136/74 (Sitting, Left Arm, Standard)  Physical Exam The physical exam findings are as follows:  General Mental Status - Alert, cooperative and good historian. General Appearance- pleasant.  Not in acute distress. Orientation- Oriented X3. Build & Nutrition- Overweight. Gait- Use of assistive device (cane) and Antalgic.  Head and Neck Head- normocephalic, atraumatic . Neck Global Assessment- supple. no bruit auscultated on the right and no bruit auscultated on the left.  Eye Pupil- Bilateral- Regular and Round. Motion- Bilateral- EOMI.  Chest and Lung Exam Auscultation: Breath sounds:- clear at anterior chest wall and - clear at posterior chest wall. Adventitious sounds:- No Adventitious sounds.  Cardiovascular Auscultation:Rhythm- Regular rate and rhythm. Heart Sounds- S1 WNL and S2 WNL. Murmurs & Other Heart Sounds:Auscultation of the heart reveals - No Murmurs.  Abdomen Palpation/Percussion:Tenderness- Abdomen is non-tender to palpation. Rigidity (guarding)- Abdomen is soft. Auscultation:Auscultation of the abdomen reveals - Bowel sounds normal.  Male Genitourinary Not done, not pertinent to present illness  Musculoskeletal R knee tender to palpation medial joint line. No tenderness to palpation of the superior calf, no tenderness to palpation of the pes anserine bursa, no tenderness to palpation of the quadriceps tendon, no tenderness to palpation of the patellar tendon, no tenderness to palpation of the patella, no tenderness to palpation of the lateral joint line, no tenderness to palpation of the fibular head and no tenderness to palpation of the peroneal nerve. Trace effusion. Mild crepitus with motion. Pulses 2+, skin soft, sensation intact. No ecchymosis, erythema, excess warmth. Normal strength 5/5, no muscle atrophy. ROM 2-120, slight flexion contracture. No instability noted. Pain with patellar compression.  xrays reviewed by Dr. Shelle Iron with no fx, subluxation, dislocation, lytic or blastic lesions. Severe end-stage bone-on-bone narrowing right knee medial joint space with subchondral sclerosis, moderate narrowing PF joint space with  associated spurring. Varus alignment.  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  Remain NPO after MN the night prior to surgery. Continue with home medications, hold his diabetic meds morning of surgery. Again discussed surgery itself, hospital stay, risks, complications, alternatives. Plans to D/C home with home health after hospital stay. Follow up 10-14 days post-op for staple removal. He does  have prior hx of DVT (unknown location) following spinal surgery approx 3 yrs ago, for which he was placed on coumadin for approx 6 months. No evidence of acute DVT. Discussed typical post-op DVT ppx protocol of Xarelto. No hx of PE. No hx of MRSA. He does have an allergy to PCN which he believes involves swelling, plan to use clinda peri-operatively. Pt scheduled for R TKA with Dr. Shelle Iron 07/03/12.    Signed electronically by Dorothy Spark PA-C for Dr. Shelle Iron

## 2012-07-06 ENCOUNTER — Inpatient Hospital Stay (HOSPITAL_COMMUNITY): Payer: Medicare Other | Admitting: Anesthesiology

## 2012-07-06 ENCOUNTER — Encounter (HOSPITAL_COMMUNITY): Payer: Self-pay | Admitting: Anesthesiology

## 2012-07-06 ENCOUNTER — Encounter (HOSPITAL_COMMUNITY): Payer: Self-pay | Admitting: *Deleted

## 2012-07-06 ENCOUNTER — Inpatient Hospital Stay (HOSPITAL_COMMUNITY)
Admission: RE | Admit: 2012-07-06 | Discharge: 2012-07-10 | DRG: 470 | Disposition: A | Discharge status: Skilled Nursing Facility | Payer: Medicare Other | Source: Ambulatory Visit | Attending: Specialist | Admitting: Specialist

## 2012-07-06 ENCOUNTER — Inpatient Hospital Stay (HOSPITAL_COMMUNITY): Payer: Medicare Other

## 2012-07-06 ENCOUNTER — Encounter (HOSPITAL_COMMUNITY)
Admission: RE | Disposition: A | Discharge status: Skilled Nursing Facility | Payer: Self-pay | Source: Ambulatory Visit | Attending: Specialist

## 2012-07-06 DIAGNOSIS — M21869 Other specified acquired deformities of unspecified lower leg: Secondary | ICD-10-CM | POA: Diagnosis present

## 2012-07-06 DIAGNOSIS — S8990XA Unspecified injury of unspecified lower leg, initial encounter: Secondary | ICD-10-CM | POA: Diagnosis not present

## 2012-07-06 DIAGNOSIS — R0602 Shortness of breath: Secondary | ICD-10-CM | POA: Diagnosis not present

## 2012-07-06 DIAGNOSIS — Z471 Aftercare following joint replacement surgery: Secondary | ICD-10-CM | POA: Diagnosis not present

## 2012-07-06 DIAGNOSIS — IMO0002 Reserved for concepts with insufficient information to code with codable children: Secondary | ICD-10-CM | POA: Diagnosis not present

## 2012-07-06 DIAGNOSIS — I10 Essential (primary) hypertension: Secondary | ICD-10-CM | POA: Diagnosis present

## 2012-07-06 DIAGNOSIS — M1711 Unilateral primary osteoarthritis, right knee: Secondary | ICD-10-CM

## 2012-07-06 DIAGNOSIS — K219 Gastro-esophageal reflux disease without esophagitis: Secondary | ICD-10-CM | POA: Diagnosis present

## 2012-07-06 DIAGNOSIS — M1710 Unilateral primary osteoarthritis, unspecified knee: Secondary | ICD-10-CM | POA: Diagnosis not present

## 2012-07-06 DIAGNOSIS — R262 Difficulty in walking, not elsewhere classified: Secondary | ICD-10-CM | POA: Diagnosis not present

## 2012-07-06 DIAGNOSIS — E119 Type 2 diabetes mellitus without complications: Secondary | ICD-10-CM | POA: Diagnosis present

## 2012-07-06 DIAGNOSIS — M6281 Muscle weakness (generalized): Secondary | ICD-10-CM | POA: Diagnosis not present

## 2012-07-06 DIAGNOSIS — Z96659 Presence of unspecified artificial knee joint: Secondary | ICD-10-CM | POA: Diagnosis not present

## 2012-07-06 DIAGNOSIS — M129 Arthropathy, unspecified: Secondary | ICD-10-CM | POA: Diagnosis not present

## 2012-07-06 DIAGNOSIS — M25569 Pain in unspecified knee: Secondary | ICD-10-CM | POA: Diagnosis not present

## 2012-07-06 HISTORY — PX: TOTAL KNEE ARTHROPLASTY: SHX125

## 2012-07-06 LAB — GLUCOSE, CAPILLARY
Glucose-Capillary: 125 mg/dL — ABNORMAL HIGH (ref 70–99)
Glucose-Capillary: 134 mg/dL — ABNORMAL HIGH (ref 70–99)

## 2012-07-06 SURGERY — ARTHROPLASTY, KNEE, TOTAL
Anesthesia: General | Site: Knee | Laterality: Right | Wound class: Clean

## 2012-07-06 MED ORDER — ONDANSETRON HCL 4 MG PO TABS: 4.0000 mg | ORAL_TABLET | Freq: Four times a day (QID) | ORAL | Status: DC | PRN

## 2012-07-06 MED ORDER — HYDROMORPHONE HCL PF 1 MG/ML IJ SOLN
INTRAMUSCULAR | Status: DC | PRN
  Administered 2012-07-06 (×4): 0.5 mg via INTRAVENOUS

## 2012-07-06 MED ORDER — LOSARTAN POTASSIUM 50 MG PO TABS: 100.0000 mg | ORAL_TABLET | Freq: Every evening | ORAL | Status: DC

## 2012-07-06 MED ORDER — METOCLOPRAMIDE HCL 10 MG PO TABS: 5.0000 mg | ORAL_TABLET | Freq: Three times a day (TID) | ORAL | Status: DC | PRN

## 2012-07-06 MED ORDER — PROMETHAZINE HCL 25 MG/ML IJ SOLN: 6.2500 mg | INTRAMUSCULAR | Status: DC | PRN

## 2012-07-06 MED ORDER — OXYCODONE HCL 5 MG PO TABS
5.0000 mg | ORAL_TABLET | ORAL | Status: DC | PRN
  Administered 2012-07-06: 10 mg via ORAL
  Administered 2012-07-06: 5 mg via ORAL
  Administered 2012-07-07 (×4): 10 mg via ORAL
  Administered 2012-07-08 (×4): 5 mg via ORAL
  Administered 2012-07-09: 10 mg via ORAL
  Administered 2012-07-09: 5 mg via ORAL
  Administered 2012-07-10: 10 mg via ORAL
  Filled 2012-07-06: qty 2
  Filled 2012-07-06 (×4): qty 1
  Filled 2012-07-06 (×2): qty 2
  Filled 2012-07-06: qty 1
  Filled 2012-07-06 (×2): qty 2
  Filled 2012-07-06 (×4): qty 1
  Filled 2012-07-06: qty 2

## 2012-07-06 MED ORDER — LACTATED RINGERS IV SOLN
INTRAVENOUS | Status: DC
  Administered 2012-07-06 (×2): via INTRAVENOUS
  Administered 2012-07-06: 1000 mL via INTRAVENOUS

## 2012-07-06 MED ORDER — ACETAMINOPHEN 10 MG/ML IV SOLN
INTRAVENOUS | Status: DC | PRN
  Administered 2012-07-06: 1000 mg via INTRAVENOUS

## 2012-07-06 MED ORDER — AMLODIPINE BESYLATE 10 MG PO TABS
10.0000 mg | ORAL_TABLET | Freq: Every morning | ORAL | Status: DC
Start: 2012-07-07 — End: 2012-07-10
  Administered 2012-07-07 – 2012-07-10 (×4): 10 mg via ORAL
  Filled 2012-07-06 (×4): qty 1

## 2012-07-06 MED ORDER — PHENOL 1.4 % MT LIQD: 1.0000 | OROMUCOSAL | Status: DC | PRN

## 2012-07-06 MED ORDER — MIDAZOLAM HCL 5 MG/5ML IJ SOLN
INTRAMUSCULAR | Status: DC | PRN
  Administered 2012-07-06: 1 mg via INTRAVENOUS

## 2012-07-06 MED ORDER — NEOSTIGMINE METHYLSULFATE 1 MG/ML IJ SOLN
INTRAMUSCULAR | Status: DC | PRN
  Administered 2012-07-06: 3 mg via INTRAVENOUS

## 2012-07-06 MED ORDER — DOCUSATE SODIUM 100 MG PO CAPS: 200.0000 mg | ORAL_CAPSULE | Freq: Every day | ORAL | Status: DC | PRN

## 2012-07-06 MED ORDER — MENTHOL 3 MG MT LOZG: 1.0000 | LOZENGE | OROMUCOSAL | Status: DC | PRN

## 2012-07-06 MED ORDER — ONDANSETRON HCL 4 MG/2ML IJ SOLN: 4.0000 mg | Freq: Four times a day (QID) | INTRAMUSCULAR | Status: DC | PRN

## 2012-07-06 MED ORDER — SUCCINYLCHOLINE CHLORIDE 20 MG/ML IJ SOLN
INTRAMUSCULAR | Status: DC | PRN
  Administered 2012-07-06: 180 mg via INTRAVENOUS

## 2012-07-06 MED ORDER — RIVAROXABAN 10 MG PO TABS
10.0000 mg | ORAL_TABLET | Freq: Every day | ORAL | Status: DC
  Administered 2012-07-07: 10 mg via ORAL
  Filled 2012-07-06 (×3): qty 1

## 2012-07-06 MED ORDER — CLINDAMYCIN PHOSPHATE 900 MG/50ML IV SOLN
900.0000 mg | INTRAVENOUS | Status: AC
  Administered 2012-07-06: 900 mg via INTRAVENOUS

## 2012-07-06 MED ORDER — ROCURONIUM BROMIDE 100 MG/10ML IV SOLN
INTRAVENOUS | Status: DC | PRN
  Administered 2012-07-06: 20 mg via INTRAVENOUS
  Administered 2012-07-06: 5 mg via INTRAVENOUS

## 2012-07-06 MED ORDER — SODIUM CHLORIDE 0.9 % IR SOLN
Status: DC | PRN
  Administered 2012-07-06: 3000 mL

## 2012-07-06 MED ORDER — HYDROMORPHONE HCL PF 1 MG/ML IJ SOLN
1.0000 mg | INTRAMUSCULAR | Status: DC | PRN
  Administered 2012-07-06 – 2012-07-07 (×3): 1 mg via INTRAVENOUS
  Filled 2012-07-06 (×5): qty 1

## 2012-07-06 MED ORDER — SODIUM CHLORIDE 0.9 % IR SOLN
Status: DC | PRN
  Administered 2012-07-06: 14:00:00

## 2012-07-06 MED ORDER — BENAZEPRIL HCL 40 MG PO TABS
40.0000 mg | ORAL_TABLET | Freq: Every day | ORAL | Status: DC
  Administered 2012-07-06 – 2012-07-09 (×4): 40 mg via ORAL
  Filled 2012-07-06 (×5): qty 1

## 2012-07-06 MED ORDER — METOCLOPRAMIDE HCL 5 MG/ML IJ SOLN: 5.0000 mg | Freq: Three times a day (TID) | INTRAMUSCULAR | Status: DC | PRN

## 2012-07-06 MED ORDER — INSULIN ASPART 100 UNIT/ML ~~LOC~~ SOLN
0.0000 [IU] | Freq: Three times a day (TID) | SUBCUTANEOUS | Status: DC
  Administered 2012-07-06 – 2012-07-07 (×2): 2 [IU] via SUBCUTANEOUS
  Administered 2012-07-07: 3 [IU] via SUBCUTANEOUS
  Administered 2012-07-07 – 2012-07-08 (×2): 2 [IU] via SUBCUTANEOUS
  Administered 2012-07-08 (×2): 3 [IU] via SUBCUTANEOUS
  Administered 2012-07-09: 2 [IU] via SUBCUTANEOUS
  Administered 2012-07-09: 3 [IU] via SUBCUTANEOUS

## 2012-07-06 MED ORDER — GLIPIZIDE ER 10 MG PO TB24
10.0000 mg | ORAL_TABLET | Freq: Every day | ORAL | Status: DC
  Administered 2012-07-07 – 2012-07-10 (×4): 10 mg via ORAL
  Filled 2012-07-06 (×6): qty 1

## 2012-07-06 MED ORDER — GLYCOPYRROLATE 0.2 MG/ML IJ SOLN
INTRAMUSCULAR | Status: DC | PRN
  Administered 2012-07-06: .4 mg via INTRAVENOUS

## 2012-07-06 MED ORDER — LIDOCAINE HCL (CARDIAC) 20 MG/ML IV SOLN
INTRAVENOUS | Status: DC | PRN
  Administered 2012-07-06: 80 mg via INTRAVENOUS

## 2012-07-06 MED ORDER — RIVAROXABAN 10 MG PO TABS: 10.0000 mg | ORAL_TABLET | Freq: Every day | ORAL | Status: DC

## 2012-07-06 MED ORDER — CLINDAMYCIN PHOSPHATE 900 MG/50ML IV SOLN
900.0000 mg | Freq: Four times a day (QID) | INTRAVENOUS | Status: AC
  Administered 2012-07-06 (×2): 900 mg via INTRAVENOUS
  Filled 2012-07-06 (×2): qty 50

## 2012-07-06 MED ORDER — HYDROMORPHONE HCL PF 1 MG/ML IJ SOLN
0.2500 mg | INTRAMUSCULAR | Status: DC | PRN
  Administered 2012-07-06 (×3): 0.5 mg via INTRAVENOUS

## 2012-07-06 MED ORDER — FENTANYL CITRATE 0.05 MG/ML IJ SOLN
INTRAMUSCULAR | Status: DC | PRN
  Administered 2012-07-06 (×2): 100 ug via INTRAVENOUS
  Administered 2012-07-06: 50 ug via INTRAVENOUS
  Administered 2012-07-06: 100 ug via INTRAVENOUS
  Administered 2012-07-06: 50 ug via INTRAVENOUS

## 2012-07-06 MED ORDER — PROPOFOL 10 MG/ML IV BOLUS
INTRAVENOUS | Status: DC | PRN
  Administered 2012-07-06: 160 mg via INTRAVENOUS

## 2012-07-06 MED ORDER — LOSARTAN POTASSIUM 50 MG PO TABS
100.0000 mg | ORAL_TABLET | Freq: Every evening | ORAL | Status: DC
  Administered 2012-07-06 – 2012-07-09 (×4): 100 mg via ORAL
  Filled 2012-07-06 (×5): qty 2

## 2012-07-06 MED ORDER — BUPIVACAINE-EPINEPHRINE 0.5% -1:200000 IJ SOLN
INTRAMUSCULAR | Status: DC | PRN
  Administered 2012-07-06: 50 mL

## 2012-07-06 MED ORDER — METHOCARBAMOL 500 MG PO TABS
500.0000 mg | ORAL_TABLET | Freq: Four times a day (QID) | ORAL | Status: DC | PRN
  Administered 2012-07-06 – 2012-07-10 (×8): 500 mg via ORAL
  Filled 2012-07-06 (×8): qty 1

## 2012-07-06 MED ORDER — ACETAMINOPHEN 325 MG PO TABS: 650.0000 mg | ORAL_TABLET | Freq: Four times a day (QID) | ORAL | Status: DC | PRN

## 2012-07-06 MED ORDER — DEXTROSE 5 % IV SOLN
500.0000 mg | Freq: Four times a day (QID) | INTRAVENOUS | Status: DC | PRN
  Filled 2012-07-06: qty 5

## 2012-07-06 MED ORDER — DOCUSATE SODIUM 100 MG PO CAPS
100.0000 mg | ORAL_CAPSULE | Freq: Two times a day (BID) | ORAL | Status: DC
  Administered 2012-07-06 – 2012-07-10 (×7): 100 mg via ORAL

## 2012-07-06 MED ORDER — OXYCODONE-ACETAMINOPHEN 5-325 MG PO TABS: 1.0000 | ORAL_TABLET | ORAL | Status: DC | PRN

## 2012-07-06 MED ORDER — SODIUM CHLORIDE 0.45 % IV SOLN
INTRAVENOUS | Status: AC
  Administered 2012-07-06 – 2012-07-07 (×2): via INTRAVENOUS

## 2012-07-06 MED ORDER — ACETAMINOPHEN 650 MG RE SUPP: 650.0000 mg | Freq: Four times a day (QID) | RECTAL | Status: DC | PRN

## 2012-07-06 MED ORDER — ONDANSETRON HCL 4 MG/2ML IJ SOLN
INTRAMUSCULAR | Status: DC | PRN
  Administered 2012-07-06: 4 mg via INTRAVENOUS

## 2012-07-06 SURGICAL SUPPLY — 65 items
BAG SPEC THK2 15X12 ZIP CLS (MISCELLANEOUS) ×1
BAG ZIPLOCK 12X15 (MISCELLANEOUS) ×2 IMPLANT
BANDAGE ELASTIC 4 VELCRO ST LF (GAUZE/BANDAGES/DRESSINGS) ×2 IMPLANT
BANDAGE ELASTIC 6 VELCRO ST LF (GAUZE/BANDAGES/DRESSINGS) ×2 IMPLANT
BANDAGE ESMARK 6X9 LF (GAUZE/BANDAGES/DRESSINGS) ×1 IMPLANT
BLADE SAG 18X100X1.27 (BLADE) ×2 IMPLANT
BLADE SAW SGTL 13.0X1.19X90.0M (BLADE) ×2 IMPLANT
BNDG CMPR 9X6 STRL LF SNTH (GAUZE/BANDAGES/DRESSINGS) ×2
BNDG ESMARK 6X9 LF (GAUZE/BANDAGES/DRESSINGS) ×4
CEMENT HV SMART SET (Cement) ×4 IMPLANT
CHLORAPREP W/TINT 26ML (MISCELLANEOUS) IMPLANT
CLOTH BEACON ORANGE TIMEOUT ST (SAFETY) ×2 IMPLANT
CUFF TOURN SGL QUICK 34 (TOURNIQUET CUFF) ×2
CUFF TRNQT CYL 34X4X40X1 (TOURNIQUET CUFF) ×1 IMPLANT
DECANTER SPIKE VIAL GLASS SM (MISCELLANEOUS) ×2 IMPLANT
DRAPE LG THREE QUARTER DISP (DRAPES) ×2 IMPLANT
DRAPE ORTHO SPLIT 77X108 STRL (DRAPES) ×4
DRAPE POUCH INSTRU U-SHP 10X18 (DRAPES) ×2 IMPLANT
DRAPE SURG ORHT 6 SPLT 77X108 (DRAPES) ×2 IMPLANT
DRAPE U-SHAPE 47X51 STRL (DRAPES) ×2 IMPLANT
DRSG ADAPTIC 3X8 NADH LF (GAUZE/BANDAGES/DRESSINGS) ×2 IMPLANT
DRSG EMULSION OIL 3X16 NADH (GAUZE/BANDAGES/DRESSINGS) ×1 IMPLANT
DRSG PAD ABDOMINAL 8X10 ST (GAUZE/BANDAGES/DRESSINGS) ×2 IMPLANT
DURAPREP 26ML APPLICATOR (WOUND CARE) ×2 IMPLANT
ELECT REM PT RETURN 9FT ADLT (ELECTROSURGICAL) ×2
ELECTRODE REM PT RTRN 9FT ADLT (ELECTROSURGICAL) ×1 IMPLANT
EVACUATOR 1/8 PVC DRAIN (DRAIN) ×2 IMPLANT
FACESHIELD LNG OPTICON STERILE (SAFETY) ×10 IMPLANT
GLOVE BIOGEL PI IND STRL 7.5 (GLOVE) ×1 IMPLANT
GLOVE BIOGEL PI IND STRL 8 (GLOVE) ×1 IMPLANT
GLOVE BIOGEL PI INDICATOR 7.5 (GLOVE) ×1
GLOVE BIOGEL PI INDICATOR 8 (GLOVE) ×1
GLOVE SURG SS PI 7.5 STRL IVOR (GLOVE) ×2 IMPLANT
GLOVE SURG SS PI 8.0 STRL IVOR (GLOVE) ×4 IMPLANT
GOWN PREVENTION PLUS LG XLONG (DISPOSABLE) ×1 IMPLANT
GOWN PREVENTION PLUS XLARGE (GOWN DISPOSABLE) ×1 IMPLANT
GOWN STRL REIN XL XLG (GOWN DISPOSABLE) ×7 IMPLANT
HANDPIECE INTERPULSE COAX TIP (DISPOSABLE) ×2
IMMOBILIZER KNEE 20 (SOFTGOODS)
IMMOBILIZER KNEE 20 THIGH 36 (SOFTGOODS) ×1 IMPLANT
IMMOBILIZER KNEE 22 UNIV (SOFTGOODS) ×1 IMPLANT
KIT BASIN OR (CUSTOM PROCEDURE TRAY) ×2 IMPLANT
MANIFOLD NEPTUNE II (INSTRUMENTS) ×2 IMPLANT
NEEDLE 27GAX1X1/2 (NEEDLE) ×2 IMPLANT
NS IRRIG 1000ML POUR BTL (IV SOLUTION) ×2 IMPLANT
PACK TOTAL JOINT (CUSTOM PROCEDURE TRAY) ×2 IMPLANT
PADDING CAST COTTON 6X4 STRL (CAST SUPPLIES) ×3 IMPLANT
POSITIONER SURGICAL ARM (MISCELLANEOUS) ×2 IMPLANT
SET HNDPC FAN SPRY TIP SCT (DISPOSABLE) ×1 IMPLANT
SPONGE GAUZE 4X4 12PLY (GAUZE/BANDAGES/DRESSINGS) ×1 IMPLANT
SPONGE SURGIFOAM ABS GEL 100 (HEMOSTASIS) ×2 IMPLANT
STAPLER VISISTAT (STAPLE) ×2 IMPLANT
SUCTION FRAZIER 12FR DISP (SUCTIONS) ×2 IMPLANT
SUT BONE WAX W31G (SUTURE) ×2 IMPLANT
SUT VIC AB 1 CT1 27 (SUTURE) ×8
SUT VIC AB 1 CT1 27XBRD ANTBC (SUTURE) ×4 IMPLANT
SUT VIC AB 2-0 CT1 27 (SUTURE) ×6
SUT VIC AB 2-0 CT1 TAPERPNT 27 (SUTURE) ×3 IMPLANT
SUT VLOC 180 0 24IN GS25 (SUTURE) ×2 IMPLANT
SYR 30ML LL (SYRINGE) ×2 IMPLANT
TOWEL OR 17X26 10 PK STRL BLUE (TOWEL DISPOSABLE) ×2 IMPLANT
TOWER CARTRIDGE SMART MIX (DISPOSABLE) ×2 IMPLANT
TRAY FOLEY CATH 14FRSI W/METER (CATHETERS) ×2 IMPLANT
WATER STERILE IRR 1500ML POUR (IV SOLUTION) ×3 IMPLANT
WRAP KNEE MAXI GEL POST OP (GAUZE/BANDAGES/DRESSINGS) ×2 IMPLANT

## 2012-07-06 NOTE — Anesthesia Preprocedure Evaluation (Signed)
Anesthesia Evaluation  Patient identified by MRN, date of birth, ID band Patient awake    Reviewed: Allergy & Precautions, H&P , NPO status , Patient's Chart, lab work & pertinent test results  Airway Mallampati: II TM Distance: >3 FB Neck ROM: Full    Dental no notable dental hx.    Pulmonary neg pulmonary ROS,  breath sounds clear to auscultation  Pulmonary exam normal       Cardiovascular hypertension, Pt. on medications Rhythm:Regular Rate:Normal     Neuro/Psych negative neurological ROS  negative psych ROS   GI/Hepatic negative GI ROS, Neg liver ROS,   Endo/Other  diabetes, Type 2  Renal/GU negative Renal ROS  negative genitourinary   Musculoskeletal negative musculoskeletal ROS (+)   Abdominal   Peds negative pediatric ROS (+)  Hematology negative hematology ROS (+)   Anesthesia Other Findings   Reproductive/Obstetrics negative OB ROS                           Anesthesia Physical Anesthesia Plan  ASA: II  Anesthesia Plan: General   Post-op Pain Management:    Induction: Intravenous  Airway Management Planned: Oral ETT  Additional Equipment:   Intra-op Plan:   Post-operative Plan: Extubation in OR  Informed Consent: I have reviewed the patients History and Physical, chart, labs and discussed the procedure including the risks, benefits and alternatives for the proposed anesthesia with the patient or authorized representative who has indicated his/her understanding and acceptance.   Dental advisory given  Plan Discussed with: CRNA and Surgeon  Anesthesia Plan Comments:         Anesthesia Quick Evaluation

## 2012-07-06 NOTE — H&P (View-Only) (Signed)
Gregory Rangel DOB: 05/24/1933 Married / Language: English / Race: White Male H&P date: 07/03/12  Chief Complaint: Right knee pain  History of Present Illness The patient is a 78 year old male who comes in today for a preoperative History and Physical. The patient is scheduled for a right total knee arthroplasty to be performed by Dr. Jeffrey C. Beane, MD at [facility] on July 06, 2012. They are now years out from when symptoms began, with ongoing pain, pain after sitting, swelling (intermittently), stiffness, instability, pain with weightbearing, difficulty ambulating and difficulty arising from chair, while the patient does not report symptoms of: pain in calf, locking or giving way. The patient feels that they are doing poorly and report their pain level to be moderate to severe. Current treatment includes: relative rest, activity modification and tylenol as needed. The patient has not gotten any relief of their symptoms with activity modification, conservative measures, Cortisone injections (only 1 month of relief with last injection, January 2013) or rest. He is s/p R knee arthroscopy by Dr. Beane in June 2011. Following that, he had some short-term relief with steroid injections and viscosupplementation (Euflexxa, 2011). His pain has progressively worsened over the past few years. His most recent steroid injection in January 2013 only seemed to help slightly for about a month. He has had to decrease his activity as it is painful for him to walk, per his wife he is mostly sedentary at this point due to pain. He uses a cane due to instability when walking. He notes stiffness after he sits, and sometimes notes swelling, especially with increased activity. He is unable to take NSAIDs due to kidney function but does take tylenol as needed. His pain is limiting his ADLs.  Dr. Beane and the pt mutually agreed to proceed with a total knee replacement. Risks and benefits of the procedure  were discussed including stiffness, suboptimal range of motion, persistent pain, infection requiring removal of prosthesis and reinsertion, need for prophylactic antibiotics in the future, for example, dental procedures, possible need for manipulation, revision in the future and also anesthetic complications including DVT, PE, etc. We discussed the perioperative course, time in the hospital, postoperative recovery and the need for elevation to control swelling. We also discussed the predicted range of motion and the probability that squatting and kneeling would be unobtainable in the future. In addition, postoperative anticoagulation was discussed. We have obtained preoperative medical clearance as necessary from Dr. Hawkins. Provided the illustrated handout and discussed it in detail. They will enroll in the total joint replacement educational forum at the hospital.  Past Medical History Prostate Cancer. s/p prostatectomy Diabetes Mellitus, Type II High blood pressure Deep vein thrombosis. s/p spinal surgery, unknown location Colon Cancer. s/p colectomy Cataract. left; right s/p surgery  Allergies Penicillin G Benzathine & Proc *PENICILLINS*. Swelling.  Family History Cerebrovascular Accident. mother  Social History Tobacco use. former smoker Drug/Alcohol Rehab (Currently). no Drug/Alcohol Rehab (Previously). no Current work status. retired Alcohol use. never consumed alcohol Children. 2 Exercise. Exercises rarely Pain Contract. no Tobacco / smoke exposure. no Marital status. married Illicit drug use. no Living situation. live with spouse, 2 steps into house Advance Directives. none Post-Surgical Plans. home with home health  Medication History GlipiZIDE XL (10MG Tablet ER 24HR, Oral) Active. Benazepril HCl (40MG Tablet, Oral) Active. Losartan Potassium (100MG Tablet, Oral) Active. AmLODIPine Besylate (10MG Tablet, Oral daily) Active.  Past Surgical  History Cataract Surgery. right Colon Polyp Removal - Colonoscopy Arthroscopy of Knee.   right Prostatectomy; Abdominal. colon CA resection 1998; prostate CA surgery 1999 Spinal Fusion. lower back Spinal Surgery Cholecystectomy  Diagnostic Studies History EKG. 06/25/12 Chest X-ray. 06/25/12  Review of Systems General:Not Present- Chills, Fever, Night Sweats, Fatigue, Weight Gain, Weight Loss and Memory Loss. Skin:Present- Itching. Not Present- Hives, Rash, Eczema and Lesions. HEENT:Present- Ringing in the Ears (occasional). Not Present- Tinnitus, Headache, Double Vision, Visual Loss, Hearing Loss and Dentures. Respiratory:Not Present- Shortness of breath with exertion, Shortness of breath at rest, Allergies, Coughing up blood and Chronic Cough. Cardiovascular:Not Present- Chest Pain, Racing/skipping heartbeats, Difficulty Breathing Lying Down, Murmur, Swelling and Palpitations. Gastrointestinal:Not Present- Bloody Stool, Heartburn, Abdominal Pain, Vomiting, Nausea, Constipation, Diarrhea, Difficulty Swallowing, Jaundice and Loss of appetitie. Male Genitourinary:Not Present- Urinary frequency, Blood in Urine, Weak urinary stream, Discharge, Flank Pain, Incontinence, Painful Urination, Urgency, Urinary Retention and Urinating at Night. Musculoskeletal:Present- Joint Stiffness, Joint Swelling, Joint Pain and Back Pain. Not Present- Muscle Weakness, Muscle Pain, Morning Stiffness and Spasms. Neurological:Not Present- Tremor, Dizziness, Blackout spells, Paralysis, Difficulty with balance and Weakness. Psychiatric:Not Present- Insomnia.  Vitals 07/03/2012 8:55 AM Weight: 240 lb Height: 72 in Body Surface Area: 2.35 m Body Mass Index: 32.55 kg/m Pulse: 75 (Regular) BP: 136/74 (Sitting, Left Arm, Standard)  Physical Exam The physical exam findings are as follows:  General Mental Status - Alert, cooperative and good historian. General Appearance- pleasant.  Not in acute distress. Orientation- Oriented X3. Build & Nutrition- Overweight. Gait- Use of assistive device (cane) and Antalgic.  Head and Neck Head- normocephalic, atraumatic . Neck Global Assessment- supple. no bruit auscultated on the right and no bruit auscultated on the left.  Eye Pupil- Bilateral- Regular and Round. Motion- Bilateral- EOMI.  Chest and Lung Exam Auscultation: Breath sounds:- clear at anterior chest wall and - clear at posterior chest wall. Adventitious sounds:- No Adventitious sounds.  Cardiovascular Auscultation:Rhythm- Regular rate and rhythm. Heart Sounds- S1 WNL and S2 WNL. Murmurs & Other Heart Sounds:Auscultation of the heart reveals - No Murmurs.  Abdomen Palpation/Percussion:Tenderness- Abdomen is non-tender to palpation. Rigidity (guarding)- Abdomen is soft. Auscultation:Auscultation of the abdomen reveals - Bowel sounds normal.  Male Genitourinary Not done, not pertinent to present illness  Musculoskeletal R knee tender to palpation medial joint line. No tenderness to palpation of the superior calf, no tenderness to palpation of the pes anserine bursa, no tenderness to palpation of the quadriceps tendon, no tenderness to palpation of the patellar tendon, no tenderness to palpation of the patella, no tenderness to palpation of the lateral joint line, no tenderness to palpation of the fibular head and no tenderness to palpation of the peroneal nerve. Trace effusion. Mild crepitus with motion. Pulses 2+, skin soft, sensation intact. No ecchymosis, erythema, excess warmth. Normal strength 5/5, no muscle atrophy. ROM 2-120, slight flexion contracture. No instability noted. Pain with patellar compression.  xrays reviewed by Dr. Beane with no fx, subluxation, dislocation, lytic or blastic lesions. Severe end-stage bone-on-bone narrowing right knee medial joint space with subchondral sclerosis, moderate narrowing PF joint space with  associated spurring. Varus alignment.  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  Remain NPO after MN the night prior to surgery. Continue with home medications, hold his diabetic meds morning of surgery. Again discussed surgery itself, hospital stay, risks, complications, alternatives. Plans to D/C home with home health after hospital stay. Follow up 10-14 days post-op for staple removal. He does   have prior hx of DVT (unknown location) following spinal surgery approx 3 yrs ago, for which he was placed on coumadin for approx 6 months. No evidence of acute DVT. Discussed typical post-op DVT ppx protocol of Xarelto. No hx of PE. No hx of MRSA. He does have an allergy to PCN which he believes involves swelling, plan to use clinda peri-operatively. Pt scheduled for R TKA with Dr. Beane 07/03/12.    Signed electronically by Brittainy Bucker M Emalene Welte PA-C for Dr. Beane  

## 2012-07-06 NOTE — Interval H&P Note (Signed)
History and Physical Interval Note:  07/06/2012 12:59 PM  Gregory Rangel  has presented today for surgery, with the diagnosis of Right Knee DJD  The various methods of treatment have been discussed with the patient and family. After consideration of risks, benefits and other options for treatment, the patient has consented to  Procedure(s): RIGHT TOTAL KNEE ARTHROPLASTY (Right) as a surgical intervention .  The patient's history has been reviewed, patient examined, no change in status, stable for surgery.  I have reviewed the patient's chart and labs.  Questions were answered to the patient's satisfaction.     Payal Stanforth C

## 2012-07-06 NOTE — Anesthesia Postprocedure Evaluation (Signed)
  Anesthesia Post-op Note  Patient: Gregory Rangel  Procedure(s) Performed: Procedure(s) (LRB): RIGHT TOTAL KNEE ARTHROPLASTY (Right)  Patient Location: PACU  Anesthesia Type: General  Level of Consciousness: awake and alert   Airway and Oxygen Therapy: Patient Spontanous Breathing  Post-op Pain: mild  Post-op Assessment: Post-op Vital signs reviewed, Patient's Cardiovascular Status Stable, Respiratory Function Stable, Patent Airway and No signs of Nausea or vomiting  Last Vitals:  Filed Vitals:   07/06/12 1530  BP: 174/73  Pulse: 86  Temp:   Resp: 20    Post-op Vital Signs: stable   Complications: No apparent anesthesia complications

## 2012-07-06 NOTE — Preoperative (Signed)
Beta Blockers   Reason not to administer Beta Blockers:Not Applicable 

## 2012-07-06 NOTE — Transfer of Care (Signed)
Immediate Anesthesia Transfer of Care Note  Patient: Gregory Rangel  Procedure(s) Performed: Procedure(s): RIGHT TOTAL KNEE ARTHROPLASTY (Right)  Patient Location: PACU  Anesthesia Type:General  Level of Consciousness: awake, alert , oriented and patient cooperative  Airway & Oxygen Therapy: Patient Spontanous Breathing and Patient connected to face mask oxygen  Post-op Assessment: Report given to PACU RN, Post -op Vital signs reviewed and stable and Patient moving all extremities  Post vital signs: Reviewed and stable  Complications: No apparent anesthesia complications

## 2012-07-06 NOTE — Progress Notes (Signed)
Orthopedic Tech Progress Note Patient Details:  Gregory Rangel Oct 08, 1933 [id number] Applied cpm per order.     Lesle Chris 07/06/2012, 8:38 PM

## 2012-07-06 NOTE — Brief Op Note (Signed)
07/06/2012  3:03 PM  PATIENT:  Gregory Rangel  79 y.o. male  PRE-OPERATIVE DIAGNOSIS:  Right Knee DJD  POST-OPERATIVE DIAGNOSIS:  Right Knee DJD  PROCEDURE:  Procedure(s): RIGHT TOTAL KNEE ARTHROPLASTY (Right)  SURGEON:  Surgeon(s) and Role:    * Javier Docker, MD - Primary  PHYSICIAN ASSISTANT:   ASSISTANTS: Dawayne Cirri ANESTHESIA:   general  EBL:  Total I/O In: 1000 [I.V.:1000] Out: 375 [Urine:300; Blood:75]  BLOOD ADMINISTERED:none  DRAINS: none   LOCAL MEDICATIONS USED:  MARCAINE     SPECIMEN:  No Specimen  DISPOSITION OF SPECIMEN:  N/A  COUNTS:  YES  TOURNIQUET:  * Missing tourniquet times found for documented tourniquets in log:  [id number] *  DICTATION: .Other Dictation: Dictation Number [id number]  PLAN OF CARE: Admit to inpatient   PATIENT DISPOSITION:  PACU - hemodynamically stable.   Delay start of Pharmacological VTE agent (>24hrs) due to surgical blood loss or risk of bleeding: no

## 2012-07-07 LAB — GLUCOSE, CAPILLARY
Glucose-Capillary: 148 mg/dL — ABNORMAL HIGH (ref 70–99)
Glucose-Capillary: 150 mg/dL — ABNORMAL HIGH (ref 70–99)
Glucose-Capillary: 170 mg/dL — ABNORMAL HIGH (ref 70–99)
Glucose-Capillary: 171 mg/dL — ABNORMAL HIGH (ref 70–99)

## 2012-07-07 LAB — CBC
MCH: 28.9 pg (ref 26.0–34.0)
MCHC: 32.4 g/dL (ref 30.0–36.0)
Platelets: 231 10*3/uL (ref 150–400)
RBC: 3.87 MIL/uL — ABNORMAL LOW (ref 4.22–5.81)

## 2012-07-07 LAB — BASIC METABOLIC PANEL
CO2: 22 mEq/L (ref 19–32)
Calcium: 8.6 mg/dL (ref 8.4–10.5)
GFR calc non Af Amer: 19 mL/min — ABNORMAL LOW (ref >90)
Potassium: 4.3 mEq/L (ref 3.5–5.1)
Sodium: 143 mEq/L (ref 135–145)

## 2012-07-07 MED ORDER — ENOXAPARIN SODIUM 30 MG/0.3ML ~~LOC~~ SOLN
30.0000 mg | SUBCUTANEOUS | Status: DC
  Administered 2012-07-08 – 2012-07-10 (×3): 30 mg via SUBCUTANEOUS
  Filled 2012-07-07 (×3): qty 0.3

## 2012-07-07 NOTE — Evaluation (Signed)
Physical Therapy Evaluation Patient Details Name: Gregory Rangel MRN: [id number] DOB: Dec 06, 1933 Today's Date: 07/07/2012 Time: 4098-1191 PT Time Calculation (min): 33 min  PT Assessment / Plan / Recommendation Clinical Impression  Pt s/p R TKR presents with decreased R LE strength/ROM and post op pain and dizziness limiting functional mobility    PT Assessment  Patient needs continued PT services    Follow Up Recommendations  Home health PT    Does the patient have the potential to tolerate intense rehabilitation      Barriers to Discharge None      Equipment Recommendations  None recommended by PT    Recommendations for Other Services OT consult   Frequency 7X/week    Precautions / Restrictions Precautions Precautions: Knee;Fall Required Braces or Orthoses: Knee Immobilizer - Right Knee Immobilizer - Right: Discontinue once straight leg raise with < 10 degree lag Restrictions Weight Bearing Restrictions: No Other Position/Activity Restrictions: WBAT   Pertinent Vitals/Pain 6-7/10; premed, cold packs provided      Mobility  Bed Mobility Bed Mobility: Supine to Sit Supine to Sit: 1: +2 Total assist;With rails Supine to Sit: Patient Percentage: 50% Details for Bed Mobility Assistance: cues for use of L LE and UEs to self assist Transfers Transfers: Sit to Stand;Stand to Sit Sit to Stand: 1: +2 Total assist;With upper extremity assist;From bed Sit to Stand: Patient Percentage: 50% Stand to Sit: 1: +2 Total assist;With upper extremity assist;With armrests;To chair/3-in-1 Stand to Sit: Patient Percentage: 50% Details for Transfer Assistance: cues and assist for R LE fwd and use of UEs to self assist Ambulation/Gait Ambulation/Gait Assistance: 1: +2 Total assist Ambulation/Gait: Patient Percentage: 60% Ambulation Distance (Feet): 3 Feet Assistive device: Rolling walker Ambulation/Gait Assistance Details: cues for posture, sequence, position from RW and increased  UE WB Gait Pattern: Step-to pattern;Decreased step length - right;Decreased step length - left;Decreased stance time - right General Gait Details: LTd by pt c/o pain, fatigue and dizziness Stairs: No    Exercises Total Joint Exercises Ankle Circles/Pumps: AROM;10 reps;Supine;Both Quad Sets: AROM;Both;10 reps;Supine Heel Slides: AAROM;10 reps;Supine;Right Straight Leg Raises: AAROM;Right;10 reps;Supine   PT Diagnosis: Difficulty walking  PT Problem List: Decreased strength;Decreased range of motion;Decreased activity tolerance;Decreased balance;Decreased mobility;Decreased knowledge of use of DME;Obesity;Pain PT Treatment Interventions: DME instruction;Gait training;Stair training;Functional mobility training;Therapeutic activities;Therapeutic exercise;Patient/family education   PT Goals Acute Rehab PT Goals PT Goal Formulation: With patient Time For Goal Achievement: 07/11/12 Potential to Achieve Goals: Good Pt will go Supine/Side to Sit: with supervision PT Goal: Supine/Side to Sit - Progress: Goal set today Pt will go Sit to Supine/Side: with supervision PT Goal: Sit to Supine/Side - Progress: Goal set today Pt will go Sit to Stand: with supervision PT Goal: Sit to Stand - Progress: Goal set today Pt will go Stand to Sit: with supervision PT Goal: Stand to Sit - Progress: Goal set today Pt will Ambulate: 51 - 150 feet;with supervision;with rolling walker PT Goal: Ambulate - Progress: Goal set today Pt will Go Up / Down Stairs: 1-2 stairs;with min assist;with least restrictive assistive device PT Goal: Up/Down Stairs - Progress: Goal set today  Visit Information  Last PT Received On: 07/07/12 Assistance Needed: +2 PT/OT Co-Evaluation/Treatment: Yes    Subjective Data  Subjective: I was having to use my arms a lot to get out of chairs Patient Stated Goal: Home with wife to resume previous lifestyle with decreased pain   Prior Functioning  Home Living Lives With:  Spouse Available Help at Discharge: Family  Type of Home: House Home Access: Stairs to enter [employer] of Steps: 2 Entrance Stairs-Rails: None Home Layout: One level Home Adaptive Equipment: Walker - rolling Prior Function Level of Independence: Independent;Independent with assistive device(s) Able to Take Stairs?: Yes Driving: Yes Vocation: Retired Musician: No difficulties Dominant Hand: Right    Cognition  Cognition Arousal/Alertness: Awake/alert Behavior During Therapy: WFL for tasks assessed/performed Overall Cognitive Status: Within Functional Limits for tasks assessed    Extremity/Trunk Assessment Right Upper Extremity Assessment RUE ROM/Strength/Tone: WFL for tasks assessed Left Upper Extremity Assessment LUE ROM/Strength/Tone: WFL for tasks assessed Right Lower Extremity Assessment RLE ROM/Strength/Tone: Deficits RLE ROM/Strength/Tone Deficits: Quads 2-/5 with AAROM at knee -15 - 30 Left Lower Extremity Assessment LLE ROM/Strength/Tone: WFL for tasks assessed Trunk Assessment Trunk Assessment: Normal   Balance    End of Session PT - End of Session Equipment Utilized During Treatment: Gait belt;Right knee immobilizer Activity Tolerance: Patient limited by fatigue;Patient limited by pain Patient left: in chair;with call bell/phone within reach;with family/visitor present Nurse Communication: Mobility status  GP     Agapito Hanway 07/07/2012, 1:14 PM

## 2012-07-07 NOTE — Op Note (Signed)
Gregory Rangel, MALMSTROM NO.:  [id number]  MEDICAL RECORD NO.:  [id number]  LOCATION:  1618                         FACILITY:  [facility]  PHYSICIAN:  Jene Every, M.D.    DATE OF BIRTH:  Dec 02, 1933  DATE OF PROCEDURE:  07/06/2012 DATE OF DISCHARGE:                              OPERATIVE REPORT   PREOPERATIVE DIAGNOSIS:  Right knee end-stage degenerative joint disease with varus deformity.  POSTOPERATIVE DIAGNOSIS:  Right knee end-stage degenerative joint disease with varus deformity.  PROCEDURE PERFORMED:  Right total knee arthroplasty.  COMPONENTS:  Utilizing DePuy rotating platform, 5 femur, 5 tibia, 12.5 mm insert, 42 patella.  HISTORY:  A 79 year old with end-stage arthrosis of the right knee refractory to conservative treatment, bone-on-bone arthritis, was indicated for replacement of degenerated joint despite rest, activity modification, physical therapy, and significant effect in his activities of daily living.  Risks and benefits were discussed including bleeding, infection, suboptimal range of motion, DVT, PE, anesthetic complications, etc.  TECHNIQUE:  With the patient in supine position, after induction of adequate general anesthesia, 900 clindamycin.  The right lower extremity was prepped and draped and exsanguinated in usual sterile fashion. Thigh tourniquet inflated to 300 mmHg.  Midline incision was made.  A median parapatellar arthrotomy was performed after full-thickness flaps were developed.  The patella was everted and knee flexed. 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  We removed the remnants of the medial and lateral menisci and the ACL, cauterized the Geniculate.  Step drill was utilized to enter the femoral canal, irrigated 5 degree, right was used with #11 off the distal femur.  We performed a cut, sized it off the anterior femur to a 5 pin and 3 degrees of external  rotation. The anterior, posterior, and chamfer cuts were then performed, subluxed the tibia.  Using external alignment guide dissecting the ankle, 4 off the defect which was medial, 2-degree slope.  We then performed our cut. Following this, we had equal flexion and extension gaps.  We removed osteophytes posteriorly.  We turned our attention towards completing the tibia, tibia subluxed, maximized at __________ medial aspect of the tibial tubercle.  This was then pinned, centrally drilled, and punch guide utilized.  We then turned our attention towards completing the femur.  Box cut was performed, centered over the canal, flushed with the lateral aspect of the condyle.  We performed our box cut and then put a trial femur and a 10 mm insert.  With slight hyperextension, but full extension and flexion, good stability of varus/valgus stressing 0-30 degrees.  Good patellofemoral tracking.  We then prepared the patella, measured to __________ used 42.  We used a planer saw and then clamped, and the patellar buttons were drilled medializing them.  We then trialed the patella and excellent patellofemoral tracking.  We copiously irrigated the wound.  We repaired the patellar arthrotomy with 1 Vicryl. Copiously irrigated the wound.  I removed all the trials, irrigated the wound.  Pulsatile lavage.  Flexed the knee, subluxed the tibia with __________ dried all surfaces thoroughly, mixed the cement on the back table, injected it into the tibia digitally pressurizing it.  We then impacted the __________ and redundant cement removed.  We cemented the femur with #5 and a 12.5 insert and reduced it, and held an axial load throughout the curing of the cement, redundant cement removed.  We cemented the patella as well.  After curing the cement, we had good flexion and extension, good stability, varus and valgus stressing 0-30 degrees, and good patellofemoral tracking and we cemented with an osteotome.   Copiously irrigated the wound and removed the trial inserted and permanent 12.5 insert, no intervening soft tissue.  Again, excellent range of motion, and stability was noted.  Hemovac was placed and brought out through a stab wound in the skin laterally.  Marcaine with epinephrine was placed in the joint.  We repaired the patellar arthrotomy with 1 Vicryl in running V-Loc, subcu with 2-0 skin staples. He had flexion to gravity at 90 degrees.  Tourniquet was deflated with adequate vascularization of lower extremity appreciated after the sterile dressing applied.  The patient tolerated the procedure well.  No complications.  He was transported to recovery room in satisfactory condition.  Tourniquet time was an hour and half.  Minimal blood loss.     Jene Every, M.D.     Cordelia Pen  D:  07/06/2012  T:  07/07/2012  Job:  [id number]

## 2012-07-07 NOTE — Evaluation (Signed)
Occupational Therapy Evaluation Patient Details Name: Gregory Rangel MRN: [id number] DOB: [birth year] Today's Date: 07/07/2012 Time: 4098-1191 OT Time Calculation (min): 29 min  OT Assessment / Plan / Recommendation Clinical Impression  Pt is s/p R TKA and displays decreased activity tolerance, increased pain and overall a change in ADL independence. He will benefit from skilled OT services to improve self care independence.     OT Assessment  Patient needs continued OT Services    Follow Up Recommendations  Home health OT;Supervision/Assistance - 24 hour    Barriers to Discharge      Equipment Recommendations  3 in 1 bedside comode (likely a 3in1. will further assess)    Recommendations for Other Services    Frequency  Min 2X/week    Precautions / Restrictions Precautions Precautions: Knee;Fall Required Braces or Orthoses: Knee Immobilizer - Right Knee Immobilizer - Right: Discontinue once straight leg raise with < 10 degree lag Restrictions Weight Bearing Restrictions: No Other Position/Activity Restrictions: WBAT        ADL  Eating/Feeding: Simulated;Independent Where Assessed - Eating/Feeding: Chair Grooming: Simulated;Wash/dry face;Set up Where Assessed - Grooming: Supported sitting Upper Body Bathing: Simulated;Chest;Right arm;Left arm;Abdomen;Set up;Supervision/safety (pt dizzy at EOB) Where Assessed - Upper Body Bathing: Unsupported sitting Lower Body Bathing: Simulated;+2 Total assistance Lower Body Bathing: Patient Percentage: 30% Where Assessed - Lower Body Bathing: Supported sit to stand Upper Body Dressing: Simulated;Minimal assistance Where Assessed - Upper Body Dressing: Unsupported sitting Lower Body Dressing: Simulated;+2 Total assistance Lower Body Dressing: Patient Percentage: 10% (pt very dizzy at EOB. Unable to let go of walker in standing) Where Assessed - Lower Body Dressing: Supported sit to stand Toilet Transfer: Simulated;+2 Total  assistance Toilet Transfer: Patient Percentage: 50% Toilet Transfer Method: Sit to stand;Other (comment);Stand pivot (pt dizzy at EOB and in standing. to chair only) Toileting - Architect and Hygiene: Simulated;+2 Total assistance Toileting - Architect and Hygiene: Patient Percentage: 0% Where Assessed - Toileting Clothing Manipulation and Hygiene: Standing Equipment Used: Rolling walker ADL Comments: Pt only tolerated up to chair as he was dizzy at EOB and also in standing. So pivoted to chair only this session. Pt's wife present for part of session. Educated on KI wear with pt only. need to further educate wife on LB dressing, KI, etc    OT Diagnosis: Generalized weakness  OT Problem List: Decreased strength;Decreased activity tolerance OT Treatment Interventions: Self-care/ADL training;Therapeutic activities;DME and/or AE instruction;Patient/family education   OT Goals Acute Rehab OT Goals OT Goal Formulation: With patient Time For Goal Achievement: 07/14/12 Potential to Achieve Goals: Good ADL Goals Pt Will Perform Grooming: with min assist;Standing at sink ADL Goal: Grooming - Progress: Goal set today Pt Will Transfer to Toilet: with min assist;Ambulation;3-in-1;with DME ADL Goal: Toilet Transfer - Progress: Goal set today Pt Will Perform Toileting - Clothing Manipulation: with min assist;Standing ADL Goal: Toileting - Clothing Manipulation - Progress: Goal set today Pt Will Perform Tub/Shower Transfer: with min assist;Shower transfer;Shower seat with back (if ready while on acute) ADL Goal: Tub/Shower Transfer - Progress: Goal set today  Visit Information  Last OT Received On: 07/07/12 Assistance Needed: +2 PT/OT Co-Evaluation/Treatment: Yes    Subjective Data  Subjective: am I going to get up? Patient Stated Goal: none stated. agreeable to PT/OT   Prior Functioning     Home Living Lives With: Spouse Available Help at Discharge: Family Type of  Home: House Home Access: Stairs to enter [employer] of Steps: 2 Entrance Stairs-Rails: None Home Layout: One  level Bathroom Shower/Tub: Health visitor: Handicapped height (vanity beside) Home Adaptive Equipment: Walker - rolling;Shower chair with back Prior Function Level of Independence: Independent;Independent with assistive device(s) Able to Take Stairs?: Yes Driving: Yes Vocation: Retired Musician: No difficulties Dominant Hand: Right         Vision/Perception     Cognition  Cognition Arousal/Alertness: Awake/alert Behavior During Therapy: WFL for tasks assessed/performed Overall Cognitive Status: Within Functional Limits for tasks assessed    Extremity/Trunk Assessment Right Upper Extremity Assessment RUE ROM/Strength/Tone: WFL for tasks assessed Left Upper Extremity Assessment LUE ROM/Strength/Tone: WFL for tasks assessed Right Lower Extremity Assessment RLE ROM/Strength/Tone: Deficits RLE ROM/Strength/Tone Deficits: Quads 2-/5 with AAROM at knee -15 - 30 Left Lower Extremity Assessment LLE ROM/Strength/Tone: WFL for tasks assessed Trunk Assessment Trunk Assessment: Normal     Mobility Bed Mobility Bed Mobility: Supine to Sit Supine to Sit: 1: +2 Total assist;With rails Supine to Sit: Patient Percentage: 50% Details for Bed Mobility Assistance: cues for use of L LE and UEs to self assist Transfers Sit to Stand: 1: +2 Total assist;With upper extremity assist;From bed Sit to Stand: Patient Percentage: 50% Stand to Sit: 1: +2 Total assist;With upper extremity assist;With armrests;To chair/3-in-1 Stand to Sit: Patient Percentage: 50% Details for Transfer Assistance: cues and assist for R LE fwd and use of UEs to self assist        Balance     End of Session OT - End of Session Equipment Utilized During Treatment: Gait belt Activity Tolerance: Other (comment) (dizziness) Patient left: in chair;with call  bell/phone within reach;with family/visitor present  GO     [name] [phone] 07/07/2012, 1:27 PM

## 2012-07-07 NOTE — Progress Notes (Signed)
   Subjective: 1 Day Post-Op Procedure(s) (LRB): RIGHT TOTAL KNEE ARTHROPLASTY (Right) Patient reports pain as moderate.   Patient seen in rounds with Dr. Darrelyn Hillock. Patient is well, and has had no acute complaints or problems. No issues overnight. No complaints of shortness of breath or chest pain  Plan is to go Home after hospital stay.  Objective: Vital signs in last 24 hours: Temp:  [97.6 F (36.4 C)-98.4 F (36.9 C)] 98.4 F (36.9 C) (04/26 0600) Pulse Rate:  [75-90] 90 (04/26 0600) Resp:  [11-20] 18 (04/26 0800) BP: (121-174)/(46-97) 130/76 mmHg (04/26 0600) SpO2:  [95 %-100 %] 98 % (04/26 0600) Weight:  [108.863 kg (240 lb)] 108.863 kg (240 lb) (04/25 1627)  Intake/Output from previous day:  Intake/Output Summary (Last 24 hours) at 07/07/12 0954 Last data filed at 07/07/12 0820  Gross per 24 hour  Intake 3155.83 ml  Output   2015 ml  Net 1140.83 ml    Intake/Output this shift: Total I/O In: 240 [P.O.:240] Out: -   Labs:  Recent Labs  07/07/12 0452  HGB 11.2*    Recent Labs  07/07/12 0452  WBC 11.6*  RBC 3.87*  HCT 34.6*  PLT 231    Recent Labs  07/07/12 0452  NA 143  K 4.3  CL 111  CO2 22  BUN 38*  CREATININE 2.95*  GLUCOSE 173*  CALCIUM 8.6    EXAM General - Patient is Alert and Oriented Extremity - Neurologically intact Intact pulses distally Dorsiflexion/Plantar flexion intact Dressing/Incision - clean, dry Motor Function - intact, moving foot and toes well on exam.  Hemovac pulled without difficulty  Past Medical History  Diagnosis Date  . Hypertension   . Shortness of breath     with exertion  . Diabetes mellitus without complication   . GERD (gastroesophageal reflux disease)   . Arthritis     Assessment/Plan: 1 Day Post-Op Procedure(s) (LRB): RIGHT TOTAL KNEE ARTHROPLASTY (Right) Principal Problem:   Right knee DJD  Estimated body mass index is 32.54 kg/(m^2) as calculated from the following:   Height as of this  encounter: 6' (1.829 m).   Weight as of this encounter: 108.863 kg (240 lb). Advance diet Up with therapy Discharge home with home health when ready, likely Monday  DVT Prophylaxis - Xarelto Weight-Bearing as tolerated to right leg  PT today. Change dressing tomorrow. DC home likely Monday  Angeleah Labrake LAUREN 07/07/2012, 9:54 AM

## 2012-07-07 NOTE — Progress Notes (Signed)
Physical Therapy Treatment Patient Details Name: Gregory Rangel MRN: [id number] DOB: 1933/08/20 Today's Date: 07/07/2012 Time: 4098-1191 PT Time Calculation (min): 23 min  PT Assessment / Plan / Recommendation Comments on Treatment Session       Follow Up Recommendations  Home health PT     Does the patient have the potential to tolerate intense rehabilitation     Barriers to Discharge None      Equipment Recommendations  None recommended by PT    Recommendations for Other Services OT consult  Frequency 7X/week   Plan Discharge plan needs to be updated    Precautions / Restrictions Precautions Precautions: Knee;Fall Required Braces or Orthoses: Knee Immobilizer - Right Knee Immobilizer - Right: Discontinue once straight leg raise with < 10 degree lag Restrictions Weight Bearing Restrictions: No Other Position/Activity Restrictions: WBAT   Pertinent Vitals/Pain 7/10; Premed, cold packs provided, RN aware    Mobility  Bed Mobility Bed Mobility: Sit to Supine Supine to Sit: 1: +2 Total assist;With rails Supine to Sit: Patient Percentage: 50% Sit to Supine: 1: +2 Total assist Sit to Supine: Patient Percentage: 60% Details for Bed Mobility Assistance: cues for use of L LE and UEs to self assist Transfers Transfers: Sit to Stand;Stand to Sit Sit to Stand: 1: +2 Total assist;With upper extremity assist;From chair/3-in-1 Sit to Stand: Patient Percentage: 60% Stand to Sit: 1: +2 Total assist;With upper extremity assist;To bed;To elevated surface Stand to Sit: Patient Percentage: 70% Details for Transfer Assistance: cues and assist for R LE fwd and use of UEs to self assist Ambulation/Gait Ambulation/Gait Assistance: 1: +2 Total assist Ambulation/Gait: Patient Percentage: 60% Ambulation Distance (Feet): 12 Feet Assistive device: Rolling walker Ambulation/Gait Assistance Details: constant cues for sequence, stride length, posture, position from RW, increased UE WB and  step to gait Gait Pattern: Step-to pattern;Decreased step length - right;Decreased step length - left;Decreased stance time - right Gait velocity: SLOW General Gait Details: LTd by pt c/o pain, fatigue and dizziness Stairs: No    Exercises Total Joint Exercises Ankle Circles/Pumps: AROM;10 reps;Supine;Both Quad Sets: AROM;Both;10 reps;Supine Heel Slides: AAROM;10 reps;Supine;Right Straight Leg Raises: AAROM;Right;10 reps;Supine   PT Diagnosis: Difficulty walking  PT Problem List: Decreased strength;Decreased range of motion;Decreased activity tolerance;Decreased balance;Decreased mobility;Decreased knowledge of use of DME;Obesity;Pain PT Treatment Interventions: DME instruction;Gait training;Stair training;Functional mobility training;Therapeutic activities;Therapeutic exercise;Patient/family education   PT Goals Acute Rehab PT Goals PT Goal Formulation: With patient Time For Goal Achievement: 07/11/12 Potential to Achieve Goals: Good Pt will go Supine/Side to Sit: with supervision PT Goal: Supine/Side to Sit - Progress: Goal set today Pt will go Sit to Supine/Side: with supervision PT Goal: Sit to Supine/Side - Progress: Goal set today Pt will go Sit to Stand: with supervision PT Goal: Sit to Stand - Progress: Goal set today Pt will go Stand to Sit: with supervision PT Goal: Stand to Sit - Progress: Goal set today Pt will Ambulate: 51 - 150 feet;with supervision;with rolling walker PT Goal: Ambulate - Progress: Goal set today Pt will Go Up / Down Stairs: 1-2 stairs;with min assist;with least restrictive assistive device PT Goal: Up/Down Stairs - Progress: Goal set today  Visit Information  Last PT Received On: 07/07/12 Assistance Needed: +2 PT/OT Co-Evaluation/Treatment: Yes    Subjective Data  Subjective: I'm doing better than this morning  Patient Stated Goal: Home with wife to resume previous lifestyle with decreased pain   Cognition  Cognition Arousal/Alertness:  Awake/alert Behavior During Therapy: WFL for tasks assessed/performed Overall Cognitive Status:  Within Functional Limits for tasks assessed    Balance     End of Session PT - End of Session Equipment Utilized During Treatment: Gait belt;Right knee immobilizer Activity Tolerance: Patient limited by fatigue;Patient limited by pain Patient left: in bed;with call bell/phone within reach;with family/visitor present Nurse Communication: Mobility status   GP     Gregory Rangel 07/07/2012, 2:26 PM

## 2012-07-07 NOTE — Care Management (Signed)
[name] spoke with patient concerning discharge planning. Pt states although PT recommends HH, pt feels spouse unable to assist in home care. Pt requesting dc plan includes discharge to a rehab facility. CSW consulted. Will follow if pt ineligible for SNF.   Gregory Rangel [phone]

## 2012-07-08 LAB — GLUCOSE, CAPILLARY
Glucose-Capillary: 131 mg/dL — ABNORMAL HIGH (ref 70–99)
Glucose-Capillary: 184 mg/dL — ABNORMAL HIGH (ref 70–99)
Glucose-Capillary: 165 mg/dL — ABNORMAL HIGH (ref 70–99)
Glucose-Capillary: 147 mg/dL — ABNORMAL HIGH (ref 70–99)

## 2012-07-08 LAB — CBC
HCT: 33.4 % — ABNORMAL LOW (ref 39.0–52.0)
Hemoglobin: 10.8 g/dL — ABNORMAL LOW (ref 13.0–17.0)
MCH: 28.7 pg (ref 26.0–34.0)
MCHC: 32.3 g/dL (ref 30.0–36.0)
RDW: 15.3 % (ref 11.5–15.5)

## 2012-07-08 MED FILL — Clindamycin Phosphate in D5W IV Soln 900 MG/50ML: INTRAVENOUS | Qty: 50 | Status: AC

## 2012-07-08 NOTE — Care Management Note (Addendum)
    Page 1 of 1   07/10/2012     12:30:01 PM   CARE MANAGEMENT NOTE 07/10/2012  Patient:  Gregory Rangel, Gregory Rangel   Account Number:  [id number]  Date Initiated:  07/07/2012  Documentation initiated by:  DAVIS,TYMEEKA  Subjective/Objective Assessment:   79 yo male admitted with right knee DJD.     Action/Plan:   SNF vs HH   Anticipated DC Date:  07/10/2012   Anticipated DC Plan:  SKILLED NURSING FACILITY  In-house referral  Clinical Social Worker      DC Planning Services  CM consult      Choice offered to / List presented to:  NA           Status of service:  Completed, signed off Medicare Important Message given?   (If response is "NO", the following Medicare IM given date fields will be blank) Date Medicare IM given:   Date Additional Medicare IM given:    Discharge Disposition:  SKILLED NURSING FACILITY  Per UR Regulation:  Reviewed for med. necessity/level of care/duration of stay  If discussed at Long Length of Stay Meetings, dates discussed:    Comments:  07/08/12 KATHY MAHABIR RN,BSN NCM WEEKEND [phone] POD#2 R TKA.PT/OT-SNF.CSW NOTIFIED.  07/07/12 1729 Leonie Green [phone] Cm spoke with patient concerning discharge planning. Pt states although PT recommends HH, pt feels spouse unable to assist in home care. Pt requesting dc plan includes discharge to a rehab facility. CSW consulted. Will follow if pt ineligible for SNF.

## 2012-07-08 NOTE — Progress Notes (Signed)
Physical Therapy Treatment Patient Details Name: Gregory Rangel MRN: [id number] DOB: 1933-04-30 Today's Date: 07/08/2012 Time: 0865-7846 PT Time Calculation (min): 34 min  PT Assessment / Plan / Recommendation Comments on Treatment Session  progressing slowly.  Feel pt may need to consider SNF placement. Continues to require +2 for safety.     Follow Up Recommendations  SNF     Does the patient have the potential to tolerate intense rehabilitation     Barriers to Discharge        Equipment Recommendations  None recommended by PT    Recommendations for Other Services OT consult  Frequency 7X/week   Plan Discharge plan remains appropriate    Precautions / Restrictions Precautions Precautions: Knee;Fall Required Braces or Orthoses: Knee Immobilizer - Right Knee Immobilizer - Right: Discontinue once straight leg raise with < 10 degree lag Restrictions Weight Bearing Restrictions: No RLE Weight Bearing: Weight bearing as tolerated   Pertinent Vitals/Pain 5/10 R knee    Mobility  Bed Mobility Bed Mobility: Sit to Supine Sit to Supine: 3: Mod assist;HOB elevated;With rail Details for Bed Mobility Assistance: Assist for R LE onto bed. Increased time. VCs safety, technique, hand placement.  Transfers Transfers: Sit to Stand;Stand to Sit Sit to Stand: 1: +2 Total assist;From chair/3-in-1 Sit to Stand: Patient Percentage: 60% Stand to Sit: To bed;3: Mod assist Stand to Sit: Patient Percentage: 70% Details for Transfer Assistance: 2 attempts to get to standing. R hand slild off of armrest on 1st attempt. Assist to rise, stabilize, control descent, move R LE forward before sitting. VCS safety, technique, hand placement. Increased time.  Ambulation/Gait Ambulation/Gait Assistance: 1: +2 Total assist Ambulation/Gait: Patient Percentage: 70% Ambulation Distance (Feet): 25 Feet Assistive device: Rolling walker Ambulation/Gait Assistance Details: slow gait speed. fatigues easily. 2  brief standing rest breaks. Assist to stabilize throughout ambulation.  Gait Pattern: Step-to pattern;Decreased stride length;Decreased step length - left;Decreased step length - right;Trunk flexed;Decreased stance time - right    Exercises Total Joint Exercises Ankle Circles/Pumps: AROM;Both;10 reps;Supine Quad Sets: AROM;Both;10 reps;Supine Heel Slides: AAROM;Right;10 reps;Supine Hip ABduction/ADduction: AAROM;Right;10 reps;Supine Straight Leg Raises: AAROM;Right;10 reps;Supine   PT Diagnosis:    PT Problem List:   PT Treatment Interventions:     PT Goals Acute Rehab PT Goals Pt will go Supine/Side to Sit: with supervision PT Goal: Supine/Side to Sit - Progress: Progressing toward goal Pt will go Sit to Supine/Side: with supervision PT Goal: Sit to Supine/Side - Progress: Progressing toward goal Pt will go Sit to Stand: with supervision PT Goal: Sit to Stand - Progress: Progressing toward goal Pt will go Stand to Sit: with supervision PT Goal: Stand to Sit - Progress: Progressing toward goal Pt will Ambulate: 51 - 150 feet;with supervision;with rolling walker PT Goal: Ambulate - Progress: Progressing toward goal  Visit Information  Last PT Received On: 07/08/12 Assistance Needed: +2    Subjective Data  Subjective: My arms give out. They get shaky Patient Stated Goal: Get stronger.    Cognition  Cognition Arousal/Alertness: Awake/alert Behavior During Therapy: WFL for tasks assessed/performed Overall Cognitive Status: Within Functional Limits for tasks assessed    Balance     End of Session PT - End of Session Equipment Utilized During Treatment: Gait belt;Right knee immobilizer Activity Tolerance: Patient limited by fatigue;Patient limited by pain Patient left: in bed;with call bell/phone within reach;with family/visitor present CPM Right Knee CPM Right Knee: On   GP     Rebeca Alert, MPT Pager: [phone]

## 2012-07-08 NOTE — Progress Notes (Signed)
   Subjective: 2 Days Post-Op Procedure(s) (LRB): RIGHT TOTAL KNEE ARTHROPLASTY (Right)  Pt c/o moderate pain to knee today  Patient reports pain as moderate.  Objective:   VITALS:   Filed Vitals:   07/08/12 0600  BP: 138/73  Pulse: 110  Temp: 99.5 F (37.5 C)  Resp: 20    Right knee incision healing well Dressing changed nv intact distally No drainage or erythema  LABS  Recent Labs  07/07/12 0452 07/08/12 0520  HGB 11.2* 10.8*  HCT 34.6* 33.4*  WBC 11.6* 12.4*  PLT 231 189     Recent Labs  07/07/12 0452  NA 143  K 4.3  BUN 38*  CREATININE 2.95*  GLUCOSE 173*     Assessment/Plan: 2 Days Post-Op Procedure(s) (LRB): RIGHT TOTAL KNEE ARTHROPLASTY (Right)  Up with therapy D/c planning Pain control     Brad Ginnifer Creelman, MPAS, PA-C  07/08/2012, 8:40 AM

## 2012-07-08 NOTE — Progress Notes (Signed)
Occupational Therapy Treatment Patient Details Name: Gregory Rangel MRN: [id number] DOB: Oct 03, 1933 Today's Date: 07/08/2012 Time: 4098-1191 OT Time Calculation (min): 38 min  OT Assessment / Plan / Recommendation Comments on Treatment Session  Pt progressing slowly, currently, he requires 2+ assist for basic mobility, and max A for BADLs.  Spoke with wife who does not feel she can provide adequate assist at discharge.  Both pt and wife agreeable to SNF.    Follow Up Recommendations  SNF    Barriers to Discharge       Equipment Recommendations  3 in 1 bedside comode    Recommendations for Other Services    Frequency Min 2X/week   Plan Discharge plan needs to be updated    Precautions / Restrictions Precautions Precautions: Knee;Fall Required Braces or Orthoses: Knee Immobilizer - Right Knee Immobilizer - Right: Discontinue once straight leg raise with < 10 degree lag Restrictions Weight Bearing Restrictions: No RLE Weight Bearing: Weight bearing as tolerated   Pertinent Vitals/Pain     ADL  Lower Body Dressing: Maximal assistance Where Assessed - Lower Body Dressing: Supported sit to stand Toilet Transfer: Simulated;+2 Total assistance Toilet Transfer: Patient Percentage: 70% Statistician Method: Sit to Barista: Raised toilet seat with arms (or 3-in-1 over toilet) Toileting - Clothing Manipulation and Hygiene: +1 Total assistance Where Assessed - Glass blower/designer Manipulation and Hygiene: Standing Equipment Used: Rolling walker Transfers/Ambulation Related to ADLs: total A +2 (pt ~70%) ADL Comments: Pt moving slowly.  Pt. instructed in use of AE, but needs to practice.  Pt. reports he struggled with LB ADLs PTA.  Spoke with wife at end of session and she does not feel she can provide adequate assist for pt to discharge home.  Would like SNF rehab    OT Diagnosis:    OT Problem List:   OT Treatment Interventions:     OT Goals Acute Rehab  OT Goals OT Goal Formulation: With patient Time For Goal Achievement: 07/14/12 Potential to Achieve Goals: Good ADL Goals Pt Will Perform Grooming: with min assist;Standing at sink Pt Will Perform Lower Body Bathing: with min assist;Sit to stand from chair;Sit to stand from bed;with adaptive equipment ADL Goal: Lower Body Bathing - Progress: Goal set today Pt Will Perform Lower Body Dressing: with min assist;Sit to stand from chair;Sit to stand from bed;with adaptive equipment ADL Goal: Lower Body Dressing - Progress: Goal set today Pt Will Transfer to Toilet: with min assist;Ambulation;3-in-1;with DME ADL Goal: Toilet Transfer - Progress: Progressing toward goals Pt Will Perform Toileting - Clothing Manipulation: with min assist;Standing ADL Goal: Toileting - Clothing Manipulation - Progress: Progressing toward goals Pt Will Perform Tub/Shower Transfer: with min assist;Shower transfer;Shower seat with back  Visit Information  Last OT Received On: 07/08/12 Assistance Needed: +2 PT/OT Co-Evaluation/Treatment: Yes    Subjective Data      Prior Functioning       Cognition  Cognition Arousal/Alertness: Awake/alert Behavior During Therapy: WFL for tasks assessed/performed Overall Cognitive Status: Within Functional Limits for tasks assessed    Mobility  Bed Mobility Bed Mobility: Sit to Supine Supine to Sit: 4: Min assist Details for Bed Mobility Assistance: Assist for R LE off bed. Increased time. VCs safety, technique, hand placement.  Transfers Sit to Stand: 1: +2 Total assist;From elevated surface Sit to Stand: Patient Percentage: 60% Stand to Sit: 1: +2 Total assist;To chair/3-in-1;With armrests Stand to Sit: Patient Percentage: 70% Details for Transfer Assistance: 2 attempts to get to standing. Assist  to rise, stabilize, control descent, move R LE forward before sitting. VCS safety, technique, hand placement. Increased time.     Exercises      Balance     End of  Session OT - End of Session Equipment Utilized During Treatment: Gait belt Activity Tolerance: Patient limited by fatigue Patient left: in chair;with call bell/phone within reach;with family/visitor present  GO     Kezia Benevides, Ursula Alert M 07/08/2012, 1:47 PM

## 2012-07-08 NOTE — Progress Notes (Addendum)
Physical Therapy Treatment Patient Details Name: Gregory Rangel MRN: [id number] DOB: 1934/03/10 Today's Date: 07/08/2012 Time: 1207-1230 PT Time Calculation (min): 23 min  PT Assessment / Plan / Recommendation Comments on Treatment Session  Progressing slowly.  Feel pt may need to consider SNF placement. Continues to require +2 for safety.     Follow Up Recommendations  SNF. (HHPT with 24 hour S/A if SNF not an option)     Does the patient have the potential to tolerate intense rehabilitation     Barriers to Discharge        Equipment Recommendations  None recommended by PT    Recommendations for Other Services OT consult  Frequency 7X/week   Plan Discharge plan needs to be updated    Precautions / Restrictions Precautions Precautions: Knee;Fall Required Braces or Orthoses: Knee Immobilizer - Right Knee Immobilizer - Right: Discontinue once straight leg raise with < 10 degree lag Restrictions Weight Bearing Restrictions: No RLE Weight Bearing: Weight bearing as tolerated   Pertinent Vitals/Pain 8/10 at start of session; 5-6/10 end of session R knee    Mobility  Bed Mobility Bed Mobility: Supine to Sit Supine to Sit: 4: Min assist Details for Bed Mobility Assistance: Assist for R LE off bed. Increased time. VCs safety, technique, hand placement.  Transfers Transfers: Stand to Sit Sit to Stand: 1: +2 Total assist;From elevated surface Sit to Stand: Patient Percentage: 60% Stand to Sit: 1: +2 Total assist;To chair/3-in-1;With armrests Stand to Sit: Patient Percentage: 70% Details for Transfer Assistance: 2 attempts to get to standing. Assist to rise, stabilize, control descent, move R LE forward before sitting. VCS safety, technique, hand placement. Increased time.  Ambulation/Gait Ambulation/Gait Assistance: 1: +2 Total assist Ambulation/Gait: Patient Percentage: 70% Ambulation Distance (Feet): 28 Feet Assistive device: Rolling walker Ambulation/Gait Assistance  Details: VCs safety, technique, sequence, posture. Assist to stabilize throughout ambulation. Pt fatigues easily. Followed with recliner. Pt with difficulty getting R heel on floor.  Gait Pattern: Step-to pattern;Decreased stride length;Decreased step length - right;Decreased step length - left;Trunk flexed;Decreased stance time - right    Exercises     PT Diagnosis:    PT Problem List:   PT Treatment Interventions:     PT Goals Acute Rehab PT Goals Pt will go Supine/Side to Sit: with supervision PT Goal: Supine/Side to Sit - Progress: Progressing toward goal Pt will go Sit to Stand: with supervision PT Goal: Sit to Stand - Progress: Progressing toward goal Pt will go Stand to Sit: with supervision PT Goal: Stand to Sit - Progress: Progressing toward goal Pt will Ambulate: 51 - 150 feet;with supervision;with rolling walker PT Goal: Ambulate - Progress: Progressing toward goal  Visit Information  Last PT Received On: 07/08/12 Assistance Needed: +2    Subjective Data  Subjective: My wife says she can take care of me Patient Stated Goal: Home with wife to resume previous lifestyle with decreased pain   Cognition  Cognition Arousal/Alertness: Awake/alert Behavior During Therapy: WFL for tasks assessed/performed Overall Cognitive Status: Within Functional Limits for tasks assessed    Balance     End of Session PT - End of Session Equipment Utilized During Treatment: Gait belt;Right knee immobilizer Activity Tolerance: Patient limited by fatigue;Patient limited by pain Patient left: in chair;with call bell/phone within reach;with family/visitor present   GP     Rebeca Alert, MPT Pager: [phone]

## 2012-07-09 ENCOUNTER — Encounter (HOSPITAL_COMMUNITY): Payer: Self-pay | Admitting: Specialist

## 2012-07-09 LAB — GLUCOSE, CAPILLARY
Glucose-Capillary: 168 mg/dL — ABNORMAL HIGH (ref 70–99)
Glucose-Capillary: 146 mg/dL — ABNORMAL HIGH (ref 70–99)

## 2012-07-09 LAB — CBC
HCT: 30.2 % — ABNORMAL LOW (ref 39.0–52.0)
Hemoglobin: 10 g/dL — ABNORMAL LOW (ref 13.0–17.0)
MCHC: 33.1 g/dL (ref 30.0–36.0)
RBC: 3.41 MIL/uL — ABNORMAL LOW (ref 4.22–5.81)
WBC: 12.2 10*3/uL — ABNORMAL HIGH (ref 4.0–10.5)

## 2012-07-09 NOTE — Progress Notes (Signed)
Physical Therapy Treatment Patient Details Name: Gregory Rangel MRN: [id number] DOB: 06/16/1933 Today's Date: 07/09/2012 Time: 4098-1191 PT Time Calculation (min): 30 min  PT Assessment / Plan / Recommendation Comments on Treatment Session  pt much improved with mobility this session. Plans for SNF tomorrow.    Follow Up Recommendations  SNF     Does the patient have the potential to tolerate intense rehabilitation     Barriers to Discharge        Equipment Recommendations  None recommended by PT    Recommendations for Other Services    Frequency 7X/week   Plan Discharge plan remains appropriate    Precautions / Restrictions Precautions Precautions: Knee;Fall Required Braces or Orthoses: Knee Immobilizer - Right Knee Immobilizer - Right: Discontinue once straight leg raise with < 10 degree lag   Pertinent Vitals/Pain     Mobility  Bed Mobility Sit to Supine: 4: Min assist;With rail Details for Bed Mobility Assistance: assist for R LE onto the bed and trunk to upright Transfers Sit to Stand: 4: Min assist;From chair/3-in-1;With upper extremity assist Stand to Sit: To bed;4: Min guard;With upper extremity assist Details for Transfer Assistance: verbal cues for R LE management and hand placement Ambulation/Gait Ambulation/Gait Assistance: 4: Min assist Ambulation/Gait: Patient Percentage: 60% Ambulation Distance (Feet): 45 Feet Assistive device: Rolling walker Ambulation/Gait Assistance Details: much improved sequence, decreased cues for sequence. Gait Pattern: Step-to pattern;Decreased step length - right;Decreased stance time - right Gait velocity: SLOW    Exercises Total Joint Exercises Quad Sets: AROM;Both;Supine;15 reps Heel Slides: AAROM;Right;10 reps;Supine Hip ABduction/ADduction: AAROM;Right;Supine;20 reps Straight Leg Raises: AAROM;Right;15 reps;Supine   PT Diagnosis:    PT Problem List:   PT Treatment Interventions:     PT Goals Acute Rehab PT  Goals Pt will go Supine/Side to Sit: with supervision PT Goal: Supine/Side to Sit - Progress: Progressing toward goal Pt will go Sit to Supine/Side: with supervision PT Goal: Sit to Supine/Side - Progress: Progressing toward goal Pt will go Sit to Stand: with supervision PT Goal: Sit to Stand - Progress: Progressing toward goal Pt will go Stand to Sit: with supervision PT Goal: Stand to Sit - Progress: Progressing toward goal Pt will Ambulate: 51 - 150 feet;with supervision;with rolling walker PT Goal: Ambulate - Progress: Progressing toward goal Pt will Go Up / Down Stairs: 1-2 stairs;with min assist;with least restrictive assistive device PT Goal: Up/Down Stairs - Progress: Discontinued (comment)  Visit Information  Last PT Received On: 07/09/12 Assistance Needed: +1    Subjective Data  Subjective: i did better.   Cognition  Cognition Arousal/Alertness: Awake/alert    Balance     End of Session PT - End of Session Equipment Utilized During Treatment: Gait belt;Right knee immobilizer Activity Tolerance: Patient tolerated treatment well Patient left: with family/visitor present;with call bell/phone within reach;in bed Nurse Communication: Mobility status CPM Right Knee CPM Right Knee: On   GP     [name] 07/09/2012, 4:32 PM

## 2012-07-09 NOTE — Evaluation (Addendum)
Clinical/Bedside Swallow Evaluation Patient Details  Name: Gregory Rangel MRN: [id number] Date of Birth: 02/11/1934  Today's Date: 07/09/2012 Time: 4098-1191 SLP Time Calculation (min): 30 min  Past Medical History:  Past Medical History  Diagnosis Date  . Hypertension   . Shortness of breath     with exertion  . Diabetes mellitus without complication   . GERD (gastroesophageal reflux disease)   . Arthritis    Past Surgical History:  Past Surgical History  Procedure Laterality Date  . Back surgery      x 2  . Tonsillectomy    . Appendectomy    . Cholecystectomy    . Eye surgery      right eye with lens implant  . Right knee arthroscopy    . Total knee arthroplasty Right 07/06/2012    Procedure: RIGHT TOTAL KNEE ARTHROPLASTY;  Surgeon: Javier Docker, MD;  Location: [facility];  Service: Orthopedics;  Laterality: Right;   HPI:  79 yo male male adm to [facility] for total knee replacement.  PMH + for prostate cancer, DM2, colon cancer s/p colostomy, cataract, DVT.  Pt had choking episode on eggs, pointing to pharynx to indicate area of stasis - requiring him several hours to cough to expectorate.  RN reports pt expectorated eggs with frothy secretions.  Pt denies neurological hx but admits to occasional reflux issues.  Pt denies choking episodes occuring frequently, wife admits to an episode on BBQ in January 2014 and current.  CXR negative for acute issues prior to sx.  Pt had not ever had swallow evaluation completed and he had not informed his md of choking episode x1 in January 2014.  Pt denies weight loss nor pulmonary infections.      Assessment / Plan / Recommendation Clinical Impression  Pt presents with no s/s of significant oropharyngeal dysphagia based on clinical evaluation.   CN exam unremarkable and pt denies neurological hx.  Pt observed consuming water and graham crackers with peanut butter - he eats slowly and masticates thoroughly - which he states is his baseline.  No  clinical indications of aspiration or severe stasis.  Xerostomia acknowledged to be an issue, pt compensates by drinking liquids prior to and throughout his meals.      Given choking episode occured today and once in January 2014, appearing to be isolated events, rec continue diet with general aspiration/reflux precautions.   Provided pt with written reflux, aspiration precautions to mitigate aspiraiton risk.  Pt also noted to have small hiatal hernia per chest imaging completed in 2011.    If pt's symptoms recur, it may be helpful to pursue instrumental testing with options being esophagram to examine esophagus AND/OR  MBS to examine oropharyngeal swallow.  Given expectorates included frothy secretions, could be indicative of esophageal more than pharyngeal deficits.   Pt's sensation of stasis being in pharynx may be referrant to distal esophagus d/t vagus nerve.  Pt also suspects he was not sitting upright fully when eating breakfast, advised him to asp/reflux precautions.    SLP to follow up x1 to assure tolerance of po diet and education completed.       Aspiration Risk  Mild    Diet Recommendation Regular   Liquid Administration via: Cup;Straw Medication Administration:  (as tolerated) Supervision: Patient able to self feed Compensations: Slow rate;Small sips/bites;Follow solids with liquid Postural Changes and/or Swallow Maneuvers: Seated upright 90 degrees;Upright 30-60 min after meal    Other  Recommendations Oral Care Recommendations: Oral care QID  Follow Up Recommendations   (TBD)    Frequency and Duration min 1 x/week  1 week   Pertinent Vitals/Pain Afebrile, decreased    SLP Swallow Goals Patient will utilize recommended strategies during swallow to increase swallowing safety with: Minimal cueing   Swallow Study Prior Functional Status   eats regular diet at home    General Date of Onset: 07/09/12 HPI: 79 yo male male adm to [facility] for total knee replacement.  PMH +  for prostate cancer, DM2, colon cancer s/p colostomy, cataract, DVT.  Pt had choking episode on eggs, pointing to pharynx to indicate area of stasis - requiring him several hours to cough to expectorate.  RN reports pt expectorated eggs with frothy secretions.  Pt denies neurological hx but admits to occasional reflux issues.  Pt denies choking episodes occuring frequently, wife admits to an episode on BBQ in January 2014 and current.  CXR negative for acute issues prior to sx.  Pt had not ever had swallow evaluation completed and he had not informed his md of choking episode x1 in January 2014.  Pt denies weight loss nor pulmonary infections.    Type of Study: Bedside swallow evaluation Diet Prior to this Study: Regular;Thin liquids Respiratory Status: Room air Behavior/Cognition: Alert;Cooperative;Pleasant mood Oral Cavity - Dentition: Adequate natural dentition Self-Feeding Abilities: Able to feed self Patient Positioning: Upright in chair Baseline Vocal Quality: Clear Volitional Cough: Strong Volitional Swallow: Able to elicit    Oral/Motor/Sensory Function Overall Oral Motor/Sensory Function: Appears within functional limits for tasks assessed   Ice Chips Ice chips: Not tested   Thin Liquid Thin Liquid: Within functional limits Presentation: Cup;Self Fed    Nectar Thick Nectar Thick Liquid: Not tested   Honey Thick Honey Thick Liquid: Not tested   Puree Puree: Not tested   Solid   GO    Solid: Within functional limits Presentation: Self Fed Other Comments: pt masticates thoroughly       Donavan Burnet, MS [facility] SLP [phone]

## 2012-07-09 NOTE — Progress Notes (Signed)
Subjective: 3 Days Post-Op Procedure(s) (LRB): RIGHT TOTAL KNEE ARTHROPLASTY (Right) Patient reports pain as mild.  Wife at bedside this AM who is concerned about him being able to return home after the hospital, as she feels she will not be able to fully assist him especially with ambulation.  Objective: Vital signs in last 24 hours: Temp:  [98 F (36.7 C)-98.8 F (37.1 C)] 98.8 F (37.1 C) (04/28 0434) Pulse Rate:  [101-108] 101 (04/28 0434) Resp:  [16-22] 20 (04/28 0434) BP: (134-149)/(71-77) 146/77 mmHg (04/28 0434) SpO2:  [95 %-97 %] 97 % (04/28 0434)  Intake/Output from previous day: 04/27 0701 - 04/28 0700 In: 240 [P.O.:240] Out: 800 [Urine:800] Intake/Output this shift:     Recent Labs  07/07/12 0452 07/08/12 0520 07/09/12 0425  HGB 11.2* 10.8* 10.0*    Recent Labs  07/08/12 0520 07/09/12 0425  WBC 12.4* 12.2*  RBC 3.76* 3.41*  HCT 33.4* 30.2*  PLT 189 188    Recent Labs  07/07/12 0452  NA 143  K 4.3  CL 111  CO2 22  BUN 38*  CREATININE 2.95*  GLUCOSE 173*  CALCIUM 8.6   No results found for this basename: LABPT, INR,  in the last 72 hours  Neurologically intact Neurovascular intact Sensation intact distally Intact pulses distally Dorsiflexion/Plantar flexion intact Incision: dressing C/D/I and no drainage No cellulitis present Compartment soft No calf pain or sign of DVT  Assessment/Plan: 3 Days Post-Op Procedure(s) (LRB): RIGHT TOTAL KNEE ARTHROPLASTY (Right) Advance diet Up with therapy Discussed D/C instructions Continue PT, plan for D/C today vs tomorrow likely to SNF Will discuss with Dr. Elissa Lovett, Dayna Barker. 07/09/2012, 7:46 AM

## 2012-07-09 NOTE — Progress Notes (Signed)
Clinical Social Work Department CLINICAL SOCIAL WORK PLACEMENT NOTE 07/09/2012  Patient:  Gregory Rangel, Gregory Rangel  Account Number:  [id number] Admit date:  07/06/2012  Clinical Social Worker:  Unk Lightning, LCSW  Date/time:  07/09/2012 10:00 AM  Clinical Social Work is seeking post-discharge placement for this patient at the following level of care:   SKILLED NURSING   (*CSW will update this form in Epic as items are completed)   07/09/2012  Patient/family provided with [facility] Department of Clinical Social Work's list of facilities offering this level of care within the geographic area requested by the patient (or if unable, by the patient's family).  07/09/2012  Patient/family informed of their freedom to choose among providers that offer the needed level of care, that participate in Medicare, Medicaid or managed care program needed by the patient, have an available bed and are willing to accept the patient.  07/09/2012  Patient/family informed of [facility]' ownership interest in [facility], as well as of the fact that they are under no obligation to receive care at this facility.  PASARR submitted to [employer] on existing # PASARR number received from [employer] on   FL2 transmitted to all facilities in geographic area requested by pt/family on  07/09/2012 FL2 transmitted to all facilities within larger geographic area on   Patient informed that his/her managed care company has contracts with or will negotiate with  certain facilities, including the following:     Patient/family informed of bed offers received:   Patient chooses bed at  Physician recommends and patient chooses bed at    Patient to be transferred to  on   Patient to be transferred to facility by   The following physician request were entered in Epic:   Additional Comments:

## 2012-07-09 NOTE — Progress Notes (Signed)
Clinical Social Work Department BRIEF PSYCHOSOCIAL ASSESSMENT 07/09/2012  Patient:  Gregory Rangel, Gregory Rangel     Account Number:  [id number]     Admit date:  07/06/2012  Clinical Social Worker:  Dennison Bulla  Date/Time:  07/09/2012 10:00 AM  Referred by:  Physician  Date Referred:  07/09/2012 Referred for  SNF Placement   Other Referral:   Interview type:  Patient Other interview type:    PSYCHOSOCIAL DATA Living Status:  FAMILY Admitted from facility:   Level of care:   Primary support name:  Mary Primary support relationship to patient:  SPOUSE Degree of support available:   Strong    CURRENT CONCERNS Current Concerns  Post-Acute Placement   Other Concerns:    SOCIAL WORK ASSESSMENT / PLAN CSW received referral to assist with dc plans. Per chart review, wife does not feel she can manage patient at home. CSW reviewed chart and met with patient and wife at bedside.    CSW introduced myself and explained role. Patient reports that he prefers to return home with help of wife. Wife reports that she does not feel she can help him ambulate and is worried about patient falling. CSW provided SNF list and explained process. CSW explained insurance coverage for SNF and encouraged patient and wife to review list. Patient gave permission for CSW to complete search in [facility] but reports he still wants to talk with wife before making any final decisions.    CSW completed FL2 and faxed out to [facility]. CSW will continue to follow to provide bed offers.   Assessment/plan status:  Psychosocial Support/Ongoing Assessment of Needs Other assessment/ plan:   Information/referral to community resources:   SNF list    PATIENT'S/FAMILY'S RESPONSE TO PLAN OF CARE: Patient alert and oriented. Patient and wife engaged throughout assessment but have not been able to agree on decision. Patient agreeable for CSW to follow up.

## 2012-07-09 NOTE — Progress Notes (Signed)
Physical Therapy Treatment Patient Details Name: Gregory Rangel MRN: [id number] DOB: Jun 20, 1933 Today's Date: 07/09/2012 Time: 1124-1200 PT Time Calculation (min): 36 min  PT Assessment / Plan / Recommendation Comments on Treatment Session  Pt is improving but continues to need much support for gasit and frequent cues. Recommend SNF.    Follow Up Recommendations  SNF     Does the patient have the potential to tolerate intense rehabilitation     Barriers to Discharge        Equipment Recommendations  None recommended by PT    Recommendations for Other Services    Frequency 7X/week   Plan Discharge plan remains appropriate    Precautions / Restrictions Precautions Precautions: Knee;Fall Required Braces or Orthoses: Knee Immobilizer - Right Knee Immobilizer - Right: Discontinue once straight leg raise with < 10 degree lag Restrictions Weight Bearing Restrictions: No RLE Weight Bearing: Weight bearing as tolerated   Pertinent Vitals/Pain 4 r knee pain. Had meds.    Mobility  Bed Mobility Bed Mobility: Supine to Sit Supine to Sit: 4: Min assist;HOB elevated Details for Bed Mobility Assistance: assist for R LE off the bed and trunk to upright Transfers Sit to Stand: 3: Mod assist;With upper extremity assist;From bed;From chair/3-in-1 Stand to Sit: 3: Mod assist;With upper extremity assist;To chair/3-in-1 Details for Transfer Assistance: verbal cues for R LE management and hand placement Ambulation/Gait Ambulation/Gait Assistance: 1: +2 Total assist Ambulation/Gait: Patient Percentage: 60% Ambulation Distance (Feet): 45 Feet Assistive device: Rolling walker Ambulation/Gait Assistance Details: slow, frequent cues for sequence and step length. Gait Pattern: Step-to pattern;Decreased step length - right;Decreased stance time - right Gait velocity: SLOW    Exercises     PT Diagnosis:    PT Problem List:   PT Treatment Interventions:     PT Goals Acute Rehab PT  Goals Pt will go Supine/Side to Sit: with supervision PT Goal: Supine/Side to Sit - Progress: Progressing toward goal Pt will go Sit to Stand: with supervision PT Goal: Sit to Stand - Progress: Progressing toward goal Pt will go Stand to Sit: with supervision PT Goal: Stand to Sit - Progress: Progressing toward goal Pt will Ambulate: 51 - 150 feet;with supervision;with rolling walker PT Goal: Ambulate - Progress: Progressing toward goal  Visit Information  Last PT Received On: 07/09/12 Assistance Needed: +2    Subjective Data  Subjective: I am better after I choked on my eggs.   Cognition  Cognition Arousal/Alertness: Awake/alert Behavior During Therapy: WFL for tasks assessed/performed    Balance  Balance Balance Assessed: Yes Dynamic Standing Balance Dynamic Standing - Level of Assistance: 3: Mod assist  End of Session PT - End of Session Equipment Utilized During Treatment: Gait belt;Right knee immobilizer Activity Tolerance: Patient tolerated treatment well Patient left: in chair;with family/visitor present;with call bell/phone within reach Nurse Communication: Mobility status   GP     [name] 07/09/2012, 1:45 PM

## 2012-07-09 NOTE — Progress Notes (Signed)
Occupational Therapy Treatment Patient Details Name: Gregory Rangel MRN: [id number] DOB: 10/21/1933 Today's Date: 07/09/2012 Time: 1127-1201 OT Time Calculation (min): 34 min  OT Assessment / Plan / Recommendation Comments on Treatment Session Pt making slow progress but still feel he will need SNF at d/c    Follow Up Recommendations  SNF;Supervision/Assistance - 24 hour    Barriers to Discharge       Equipment Recommendations  3 in 1 bedside comode    Recommendations for Other Services    Frequency Min 2X/week   Plan Discharge plan remains appropriate    Precautions / Restrictions Precautions Precautions: Knee;Fall Required Braces or Orthoses: Knee Immobilizer - Right Knee Immobilizer - Right: Discontinue once straight leg raise with < 10 degree lag Restrictions Weight Bearing Restrictions: No RLE Weight Bearing: Weight bearing as tolerated        ADL  Toilet Transfer: Performed;+2 Total assistance Toilet Transfer: Patient Percentage: 60% Toilet Transfer Method: Other (comment) (with walker into bathroom. mod assist to stand from 3in1) Toilet Transfer Equipment: Raised toilet seat with arms (or 3-in-1 over toilet) Toileting - Clothing Manipulation and Hygiene: Performed;Moderate assistance (to balance and pull up underwear) Where Assessed - Toileting Clothing Manipulation and Hygiene: Sit to stand from 3-in-1 or toilet Equipment Used: Rolling walker ADL Comments: Cotx with PT. Pt needs constant cues to step inside of RW and keep RW proper distance from self. He tends to push it too far in front of him. Pt unsteady when he lets go of walker to pull up underwear, requiring mod assist to maintain balance. He needs cues also to remember to extend R LE out in front before functional transfers on and off toilet and for hand placement. Pt is at risk for fall and feel he will need SNF at d/c as wife states she has physical limitations of her own . Per wife, he has all AE and didn't  have to use it for LB ADL PTA.     OT Diagnosis:    OT Problem List:   OT Treatment Interventions:     OT Goals ADL Goals ADL Goal: Toilet Transfer - Progress: Progressing toward goals ADL Goal: Toileting - Clothing Manipulation - Progress: Progressing toward goals  Visit Information  Last OT Received On: 07/09/12 Assistance Needed: +2 PT/OT Co-Evaluation/Treatment: Yes    Subjective Data  Subjective: i could probably go to the bathroom Patient Stated Goal: wants to do what he can for himself   Prior Functioning       Cognition  Cognition Arousal/Alertness: Awake/alert Behavior During Therapy: WFL for tasks assessed/performed    Mobility  Bed Mobility Bed Mobility: Supine to Sit Supine to Sit: 4: Min assist;HOB elevated Details for Bed Mobility Assistance: assist for R LE off the bed and trunk to upright Transfers Transfers: Sit to Stand;Stand to Sit Sit to Stand: 3: Mod assist;With upper extremity assist;From bed;From chair/3-in-1 Stand to Sit: 3: Mod assist;With upper extremity assist;To chair/3-in-1 Details for Transfer Assistance: verbal cues for R LE management and hand placement    Exercises      Balance Balance Balance Assessed: Yes Dynamic Standing Balance Dynamic Standing - Level of Assistance: 3: Mod assist   End of Session OT - End of Session Equipment Utilized During Treatment: Gait belt Activity Tolerance: Patient tolerated treatment well Patient left: in chair;with call bell/phone within reach;with family/visitor present  GO     [name] [phone] 07/09/2012, 12:28 PM

## 2012-07-10 ENCOUNTER — Encounter (INDEPENDENT_AMBULATORY_CARE_PROVIDER_SITE_OTHER): Payer: Self-pay | Admitting: *Deleted

## 2012-07-10 ENCOUNTER — Inpatient Hospital Stay
Admission: RE | Admit: 2012-07-10 | Discharge: 2012-08-09 | Disposition: A | Discharge status: Other Acute Inpatient Hospital | Payer: PRIVATE HEALTH INSURANCE | Source: Ambulatory Visit | Attending: Pulmonary Disease | Admitting: Pulmonary Disease

## 2012-07-10 ENCOUNTER — Encounter (INDEPENDENT_AMBULATORY_CARE_PROVIDER_SITE_OTHER): Payer: Self-pay

## 2012-07-10 DIAGNOSIS — M129 Arthropathy, unspecified: Secondary | ICD-10-CM | POA: Diagnosis not present

## 2012-07-10 DIAGNOSIS — K449 Diaphragmatic hernia without obstruction or gangrene: Secondary | ICD-10-CM | POA: Diagnosis not present

## 2012-07-10 DIAGNOSIS — Z96659 Presence of unspecified artificial knee joint: Secondary | ICD-10-CM | POA: Diagnosis not present

## 2012-07-10 DIAGNOSIS — R279 Unspecified lack of coordination: Secondary | ICD-10-CM | POA: Diagnosis not present

## 2012-07-10 DIAGNOSIS — S8990XA Unspecified injury of unspecified lower leg, initial encounter: Secondary | ICD-10-CM | POA: Diagnosis not present

## 2012-07-10 DIAGNOSIS — M79609 Pain in unspecified limb: Secondary | ICD-10-CM | POA: Diagnosis not present

## 2012-07-10 DIAGNOSIS — R0602 Shortness of breath: Secondary | ICD-10-CM | POA: Diagnosis not present

## 2012-07-10 DIAGNOSIS — M7989 Other specified soft tissue disorders: Secondary | ICD-10-CM

## 2012-07-10 DIAGNOSIS — Z01812 Encounter for preprocedural laboratory examination: Secondary | ICD-10-CM | POA: Diagnosis not present

## 2012-07-10 DIAGNOSIS — I824Y9 Acute embolism and thrombosis of unspecified deep veins of unspecified proximal lower extremity: Secondary | ICD-10-CM | POA: Diagnosis not present

## 2012-07-10 DIAGNOSIS — M6281 Muscle weakness (generalized): Secondary | ICD-10-CM | POA: Diagnosis not present

## 2012-07-10 DIAGNOSIS — R1310 Dysphagia, unspecified: Principal | ICD-10-CM

## 2012-07-10 DIAGNOSIS — L0291 Cutaneous abscess, unspecified: Secondary | ICD-10-CM | POA: Diagnosis not present

## 2012-07-10 DIAGNOSIS — R1314 Dysphagia, pharyngoesophageal phase: Secondary | ICD-10-CM | POA: Diagnosis not present

## 2012-07-10 DIAGNOSIS — Z471 Aftercare following joint replacement surgery: Secondary | ICD-10-CM | POA: Diagnosis not present

## 2012-07-10 DIAGNOSIS — I10 Essential (primary) hypertension: Secondary | ICD-10-CM | POA: Diagnosis not present

## 2012-07-10 DIAGNOSIS — R1319 Other dysphagia: Secondary | ICD-10-CM | POA: Diagnosis not present

## 2012-07-10 DIAGNOSIS — K222 Esophageal obstruction: Secondary | ICD-10-CM | POA: Diagnosis not present

## 2012-07-10 DIAGNOSIS — E119 Type 2 diabetes mellitus without complications: Secondary | ICD-10-CM | POA: Diagnosis not present

## 2012-07-10 DIAGNOSIS — M25561 Pain in right knee: Secondary | ICD-10-CM

## 2012-07-10 DIAGNOSIS — R1312 Dysphagia, oropharyngeal phase: Secondary | ICD-10-CM | POA: Diagnosis not present

## 2012-07-10 DIAGNOSIS — R262 Difficulty in walking, not elsewhere classified: Secondary | ICD-10-CM | POA: Diagnosis not present

## 2012-07-10 DIAGNOSIS — K219 Gastro-esophageal reflux disease without esophagitis: Secondary | ICD-10-CM | POA: Diagnosis not present

## 2012-07-10 DIAGNOSIS — M79604 Pain in right leg: Secondary | ICD-10-CM

## 2012-07-10 DIAGNOSIS — K2960 Other gastritis without bleeding: Secondary | ICD-10-CM | POA: Diagnosis not present

## 2012-07-10 DIAGNOSIS — D131 Benign neoplasm of stomach: Secondary | ICD-10-CM | POA: Diagnosis not present

## 2012-07-10 DIAGNOSIS — M25569 Pain in unspecified knee: Secondary | ICD-10-CM | POA: Diagnosis not present

## 2012-07-10 DIAGNOSIS — I82409 Acute embolism and thrombosis of unspecified deep veins of unspecified lower extremity: Secondary | ICD-10-CM | POA: Diagnosis not present

## 2012-07-10 LAB — GLUCOSE, CAPILLARY: Glucose-Capillary: 107 mg/dL — ABNORMAL HIGH (ref 70–99)

## 2012-07-10 MED ORDER — POLYETHYLENE GLYCOL 3350 17 G PO PACK: 17.0000 g | PACK | Freq: Once | ORAL | Status: DC

## 2012-07-10 NOTE — Discharge Summary (Signed)
Physician Discharge Summary   Patient ID: Gregory Rangel MRN: [id number] DOB/AGE: June 13, 1933 79 y.o.  Admit date: 07/06/2012 Discharge date:   Primary Diagnosis: right knee DJD  Admission Diagnoses:  Past Medical History  Diagnosis Date  . Hypertension   . Shortness of breath     with exertion  . Diabetes mellitus without complication   . GERD (gastroesophageal reflux disease)   . Arthritis    Discharge Diagnoses:   Principal Problem:   Right knee DJD  Estimated body mass index is 32.54 kg/(m^2) as calculated from the following:   Height as of this encounter: 6' (1.829 m).   Weight as of this encounter: 108.863 kg (240 lb).  Procedure:  Procedure(s) (LRB): RIGHT TOTAL KNEE ARTHROPLASTY (Right)   Consults: None  HPI: see H&P Laboratory Data: Admission on 07/06/2012  Component Date Value Range Status  . Glucose-Capillary 07/06/2012 111* 70 - 99 mg/dL Final  . Comment 1 40/98/1191 Documented in Chart   Final  . Glucose-Capillary 07/06/2012 125* 70 - 99 mg/dL Final  . Comment 1 47/82/9562 Documented in Chart   Final  . Comment 2 07/06/2012 Notify RN   Final  . WBC 07/07/2012 11.6* 4.0 - 10.5 K/uL Final  . RBC 07/07/2012 3.87* 4.22 - 5.81 MIL/uL Final  . Hemoglobin 07/07/2012 11.2* 13.0 - 17.0 g/dL Final  . HCT 13/10/6576 34.6* 39.0 - 52.0 % Final  . MCV 07/07/2012 89.4  78.0 - 100.0 fL Final  . MCH 07/07/2012 28.9  26.0 - 34.0 pg Final  . MCHC 07/07/2012 32.4  30.0 - 36.0 g/dL Final  . RDW 46/96/2952 15.6* 11.5 - 15.5 % Final  . Platelets 07/07/2012 231  150 - 400 K/uL Final  . Sodium 07/07/2012 143  135 - 145 mEq/L Final  . Potassium 07/07/2012 4.3  3.5 - 5.1 mEq/L Final  . Chloride 07/07/2012 111  96 - 112 mEq/L Final  . CO2 07/07/2012 22  19 - 32 mEq/L Final  . Glucose, Bld 07/07/2012 173* 70 - 99 mg/dL Final  . BUN 84/13/2440 38* 6 - 23 mg/dL Final  . Creatinine, Ser 07/07/2012 2.95* 0.50 - 1.35 mg/dL Final  . Calcium 01/08/2535 8.6  8.4 - 10.5 mg/dL Final    . GFR calc non Af Amer 07/07/2012 19* >90 mL/min Final  . GFR calc Af Amer 07/07/2012 22* >90 mL/min Final   Comment:                                 The eGFR has been calculated                          using the CKD EPI equation.                          This calculation has not been                          validated in all clinical                          situations.                          eGFR's persistently                          <  90 mL/min signify                          possible Chronic Kidney Disease.  . Glucose-Capillary 07/06/2012 134* 70 - 99 mg/dL Final  . Glucose-Capillary 07/06/2012 173* 70 - 99 mg/dL Final  . Glucose-Capillary 07/07/2012 148* 70 - 99 mg/dL Final  . Glucose-Capillary 07/07/2012 150* 70 - 99 mg/dL Final  . WBC 16/12/9602 12.4* 4.0 - 10.5 K/uL Final  . RBC 07/08/2012 3.76* 4.22 - 5.81 MIL/uL Final  . Hemoglobin 07/08/2012 10.8* 13.0 - 17.0 g/dL Final  . HCT 54/11/8117 33.4* 39.0 - 52.0 % Final  . MCV 07/08/2012 88.8  78.0 - 100.0 fL Final  . MCH 07/08/2012 28.7  26.0 - 34.0 pg Final  . MCHC 07/08/2012 32.3  30.0 - 36.0 g/dL Final  . RDW 14/78/2956 15.3  11.5 - 15.5 % Final  . Platelets 07/08/2012 189  150 - 400 K/uL Final  . Glucose-Capillary 07/07/2012 170* 70 - 99 mg/dL Final  . Glucose-Capillary 07/07/2012 171* 70 - 99 mg/dL Final  . Comment 1 21/30/8657 Documented in Chart   Final  . Comment 2 07/07/2012 Notify RN   Final  . Glucose-Capillary 07/08/2012 131* 70 - 99 mg/dL Final  . Glucose-Capillary 07/08/2012 184* 70 - 99 mg/dL Final  . WBC 84/69/6295 12.2* 4.0 - 10.5 K/uL Final  . RBC 07/09/2012 3.41* 4.22 - 5.81 MIL/uL Final  . Hemoglobin 07/09/2012 10.0* 13.0 - 17.0 g/dL Final  . HCT 28/41/3244 30.2* 39.0 - 52.0 % Final  . MCV 07/09/2012 88.6  78.0 - 100.0 fL Final  . MCH 07/09/2012 29.3  26.0 - 34.0 pg Final  . MCHC 07/09/2012 33.1  30.0 - 36.0 g/dL Final  . RDW 03/16/7251 15.3  11.5 - 15.5 % Final  . Platelets 07/09/2012 188  150 -  400 K/uL Final  . Glucose-Capillary 07/08/2012 165* 70 - 99 mg/dL Final  . Glucose-Capillary 07/08/2012 147* 70 - 99 mg/dL Final  . Comment 1 66/44/0347 Notify RN   Final  . Comment 2 07/08/2012 Documented in Chart   Final  . Glucose-Capillary 07/09/2012 110* 70 - 99 mg/dL Final  . Glucose-Capillary 07/09/2012 168* 70 - 99 mg/dL Final  . Glucose-Capillary 07/09/2012 146* 70 - 99 mg/dL Final  . Glucose-Capillary 07/09/2012 213* 70 - 99 mg/dL Final  Hospital Outpatient Visit on 06/27/2012  Component Date Value Range Status  . Sodium 06/27/2012 139  135 - 145 mEq/L Final  . Potassium 06/27/2012 4.1  3.5 - 5.1 mEq/L Final  . Chloride 06/27/2012 107  96 - 112 mEq/L Final  . CO2 06/27/2012 22  19 - 32 mEq/L Final  . Glucose, Bld 06/27/2012 110* 70 - 99 mg/dL Final  . BUN 42/59/5638 45* 6 - 23 mg/dL Final  . Creatinine, Ser 06/27/2012 2.72* 0.50 - 1.35 mg/dL Final  . Calcium 75/64/3329 9.3  8.4 - 10.5 mg/dL Final  . GFR calc non Af Amer 06/27/2012 21* >90 mL/min Final  . GFR calc Af Amer 06/27/2012 25* >90 mL/min Final   Comment:                                 The eGFR has been calculated                          using the CKD EPI equation.  This calculation has not been                          validated in all clinical                          situations.                          eGFR's persistently                          <90 mL/min signify                          possible Chronic Kidney Disease.  . WBC 06/27/2012 7.8  4.0 - 10.5 K/uL Final  . RBC 06/27/2012 4.88  4.22 - 5.81 MIL/uL Final  . Hemoglobin 06/27/2012 13.9  13.0 - 17.0 g/dL Final  . HCT 65/78/4696 43.0  39.0 - 52.0 % Final  . MCV 06/27/2012 88.1  78.0 - 100.0 fL Final  . MCH 06/27/2012 28.5  26.0 - 34.0 pg Final  . MCHC 06/27/2012 32.3  30.0 - 36.0 g/dL Final  . RDW 29/52/8413 14.6  11.5 - 15.5 % Final  . Platelets 06/27/2012 223  150 - 400 K/uL Final  . Prothrombin Time 06/27/2012 12.5  11.6  - 15.2 seconds Final  . INR 06/27/2012 0.94  0.00 - 1.49 Final  . MRSA, PCR 06/27/2012 NEGATIVE  NEGATIVE Final  . Staphylococcus aureus 06/27/2012 NEGATIVE  NEGATIVE Final   Comment:                                 The Xpert SA Assay (FDA                          approved for NASAL specimens                          in patients over 1 years of age),                          is one component of                          a comprehensive surveillance                          program.  Test performance has                          been validated by [employer] for patients greater                          than or equal to 75 year old.                          It is not intended  to diagnose infection nor to                          guide or monitor treatment.  . ABO/RH(D) 06/27/2012 O POS   Final  . Antibody Screen 06/27/2012 NEG   Final  . Sample Expiration 06/27/2012 07/09/2012   Final  . Color, Urine 06/27/2012 YELLOW  YELLOW Final  . APPearance 06/27/2012 CLEAR  CLEAR Final  . Specific Gravity, Urine 06/27/2012 1.015  1.005 - 1.030 Final  . pH 06/27/2012 5.0  5.0 - 8.0 Final  . Glucose, UA 06/27/2012 100* NEGATIVE mg/dL Final  . Hgb urine dipstick 06/27/2012 SMALL* NEGATIVE Final  . Bilirubin Urine 06/27/2012 NEGATIVE  NEGATIVE Final  . Ketones, ur 06/27/2012 NEGATIVE  NEGATIVE mg/dL Final  . Protein, ur 16/12/9602 >300* NEGATIVE mg/dL Final  . Urobilinogen, UA 06/27/2012 0.2  0.0 - 1.0 mg/dL Final  . Nitrite 54/11/8117 NEGATIVE  NEGATIVE Final  . Leukocytes, UA 06/27/2012 NEGATIVE  NEGATIVE Final  . ABO/RH(D) 06/27/2012 O POS   Final  . Squamous Epithelial / LPF 06/27/2012 FEW* RARE Final  . RBC / HPF 06/27/2012 0-2  <3 RBC/hpf Final  . Casts 06/27/2012 HYALINE CASTS* NEGATIVE Final     X-Rays:Dg Chest 2 View  06/27/2012  *RADIOLOGY REPORT*  Clinical Data: Preop for right knee arthroplasty  CHEST - 2 VIEW  Comparison: Chest  radiograph 10/04/2010 and 08/21/2009  Findings: The heart size is within normal limits.  Mediastinal and hilar contours are stable and within normal limits.  Left juxtacardiac fat pad is again noted.  Lungs are mildly hyperinflated clear.  Negative for airspace disease or pleural effusion. There are degenerative changes of the thoracic spine. Surgical changes of the spine are partially visualized in the upper lumbar region.  IMPRESSION: Stable chest radiograph.  Mild hyperinflation appears similar to prior exam.  No acute cardiopulmonary disease identified.   Original Report Authenticated By: Britta Mccreedy, M.D.    Dg Knee 1-2 Views Right  07/06/2012  *RADIOLOGY REPORT*  Clinical Data: Status post knee replacement.  RIGHT KNEE - 1-2 VIEW  Comparison: Plain films 06/17/2012.  Findings: The patient has a new right total knee arthroplasty.  The device is located and there is no fracture.  Small amount of gas in the soft tissues, surgical drain and staples are noted.  IMPRESSION: Right knee replacement without evidence of complication.   Original Report Authenticated By: Holley Dexter, M.D.    Dg Knee 1-2 Views Right  06/27/2012  *RADIOLOGY REPORT*  Clinical Data: Right knee degenerative joint disease.  Preop for total knee arthroplasty  RIGHT KNEE - 1-2 VIEW  Comparison: Right knee radiographs 12/18/2009  Findings: There is diffuse bony demineralization.  There is joint space narrowing of the medial compartment with small marginal spurring.  Spur formation is seen in the anterior distal femur and both poles of the patella.  Lateral compartment appears maintained. No acute bony abnormality is identified.  Scattered vascular calcifications are noted.  No definite joint effusion or focal soft tissue swelling appreciated.  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   Original Report Authenticated By: Britta Mccreedy, M.D.     EKG: Orders placed during the hospital  encounter of 06/27/12  . EKG 12-LEAD  . EKG 12-LEAD     Hospital Course: Gregory Rangel is a 79 y.o. who was admitted to [facility]. They were brought to the operating room on 07/06/2012 and  underwent Procedure(s): RIGHT TOTAL KNEE ARTHROPLASTY.  Patient tolerated the procedure well and was later transferred to the recovery room and then to the orthopaedic floor for postoperative care.  They were given PO and IV analgesics for pain control following their surgery.  They were given 24 hours of postoperative antibiotics of  Anti-infectives   Start     Dose/Rate Route Frequency Ordered Stop   07/06/12 1800  clindamycin (CLEOCIN) IVPB 900 mg     900 mg 100 mL/hr over 30 Minutes Intravenous Every 6 hours 07/06/12 1630 07/07/12 0016   07/06/12 1355  polymyxin B 500,000 Units, bacitracin 50,000 Units in sodium chloride irrigation 0.9 % 500 mL irrigation  Status:  Discontinued       As needed 07/06/12 1355 07/06/12 1512   07/06/12 1045  clindamycin (CLEOCIN) IVPB 900 mg     900 mg 100 mL/hr over 30 Minutes Intravenous On call to O.R. 07/06/12 1035 07/06/12 1247     and started on DVT prophylaxis in the form of Xarelto and TED hose.   PT and OT were ordered for total joint protocol.  Discharge planning consulted to help with postop disposition and equipment needs.  Patient had a good night on the evening of surgery.  They started to get up OOB with therapy on day one. Hemovac drain was pulled without difficulty.  Continued to work with therapy into day two.  Dressing was changed on day two and the incision was healing well, clean and dry. Slow progress with PT day two which improved by second session day 3. Pt still requiring assist and SNF stay recommended. By day four, the patient had progressed with therapy and meeting their goals.  Incision was healing well.  Patient was seen in rounds and was ready to go to SNF.   Discharge Medications: Prior to Admission medications   Medication Sig  Start Date End Date Taking? Authorizing Provider  acetaminophen (TYLENOL) 500 MG tablet Take 500 mg by mouth every 6 (six) hours as needed for pain.   Yes Historical Provider, MD  amLODipine (NORVASC) 10 MG tablet Take 10 mg by mouth every morning.   Yes Historical Provider, MD  benazepril (LOTENSIN) 40 MG tablet Take 40 mg by mouth daily with supper.   Yes Historical Provider, MD  docusate sodium (COLACE) 100 MG capsule Take 200 mg by mouth daily as needed for constipation.   Yes Historical Provider, MD  glipiZIDE (GLUCOTROL XL) 10 MG 24 hr tablet Take 10 mg by mouth every morning.   Yes Historical Provider, MD  losartan (COZAAR) 100 MG tablet Take 100 mg by mouth every evening.   Yes Historical Provider, MD  oxyCODONE-acetaminophen (PERCOCET) 5-325 MG per tablet Take 1 tablet by mouth every 4 (four) hours as needed for pain. 07/06/12   Javier Docker, MD  rivaroxaban (XARELTO) 10 MG TABS tablet Take 1 tablet (10 mg total) by mouth daily. 07/06/12   Javier Docker, MD    Diet: Diabetic diet Activity:WBAT Follow-up:in 10 days Disposition - Skilled nursing facility Discharged Condition: good      Medication List    TAKE these medications       oxyCODONE-acetaminophen 5-325 MG per tablet  Commonly known as:  PERCOCET  Take 1 tablet by mouth every 4 (four) hours as needed for pain.     rivaroxaban 10 MG Tabs tablet  Commonly known as:  XARELTO  Take 1 tablet (10 mg total) by mouth daily.      ASK  your doctor about these medications       acetaminophen 500 MG tablet  Commonly known as:  TYLENOL  Take 500 mg by mouth every 6 (six) hours as needed for pain.     amLODipine 10 MG tablet  Commonly known as:  NORVASC  Take 10 mg by mouth every morning.     benazepril 40 MG tablet  Commonly known as:  LOTENSIN  Take 40 mg by mouth daily with supper.     docusate sodium 100 MG capsule  Commonly known as:  COLACE  Take 200 mg by mouth daily as needed for constipation.      glipiZIDE 10 MG 24 hr tablet  Commonly known as:  GLUCOTROL XL  Take 10 mg by mouth every morning.     losartan 100 MG tablet  Commonly known as:  COZAAR  Take 100 mg by mouth every evening.           Follow-up Information   Follow up with BEANE,JEFFREY C, MD In 2 weeks.   Contact information:   [address] [place] [place] [address] [phone]       Signed: Dorothy Spark. 07/10/2012, 7:43 AM

## 2012-07-10 NOTE — Progress Notes (Signed)
Physical Therapy Treatment Patient Details Name: Gregory Rangel MRN: [id number] DOB: 06-15-1933 Today's Date: 07/10/2012 Time: 7829-5621 PT Time Calculation (min): 45 min  PT Assessment / Plan / Recommendation Comments on Treatment Session  pt a little dizzy upon sitting up. BP 155/77. Ptt was able to walk in hallway. Plans SNF today.    Follow Up Recommendations        Does the patient have the potential to tolerate intense rehabilitation     Barriers to Discharge        Equipment Recommendations  None recommended by PT    Recommendations for Other Services    Frequency     Plan Discharge plan remains appropriate    Precautions / Restrictions Precautions Precautions: Knee;Fall Required Braces or Orthoses: Knee Immobilizer - Right Knee Immobilizer - Right: Discontinue once straight leg raise with < 10 degree lag Restrictions Weight Bearing Restrictions: No RLE Weight Bearing: Weight bearing as tolerated   Pertinent Vitals/Pain 2 pain. 155/77 BP after sitting up    Mobility  Bed Mobility Supine to Sit: 5: Supervision;With rails;HOB elevated Details for Bed Mobility Assistance: pt performed with no  assistance but need to use Rails. Transfers Sit to Stand: 4: Min assist;With upper extremity assist;From bed Stand to Sit: 4: Min guard;With upper extremity assist;To chair/3-in-1 Details for Transfer Assistance: verbal cues for R LE management and hand placement Ambulation/Gait Ambulation/Gait Assistance: 4: Min assist Ambulation Distance (Feet): 45 Feet Assistive device: Rolling walker Ambulation/Gait Assistance Details: pt initially dizzy but did not worsen. Gait Pattern: Step-to pattern;Decreased step length - right;Decreased stance time - right Gait velocity: SLOW    Exercises Total Joint Exercises Ankle Circles/Pumps: AROM;Both;10 reps;Supine Quad Sets: AROM;Both;Supine;15 reps Short Arc Quad: AAROM;10 reps;Right;Supine Goniometric ROM: 10-50 R knee.flexion.    PT Diagnosis:    PT Problem List:   PT Treatment Interventions:     PT Goals Acute Rehab PT Goals Pt will go Supine/Side to Sit: with supervision PT Goal: Supine/Side to Sit - Progress: Progressing toward goal Pt will go Sit to Stand: with supervision PT Goal: Sit to Stand - Progress: Progressing toward goal Pt will go Stand to Sit: with supervision PT Goal: Stand to Sit - Progress: Progressing toward goal Pt will Ambulate: 51 - 150 feet;with supervision;with rolling walker PT Goal: Ambulate - Progress: Progressing toward goal  Visit Information  Last PT Received On: 07/10/12 Assistance Needed: +1    Subjective Data  Subjective: I feel a littlw queasy.I will try.   Cognition  Cognition Arousal/Alertness: Awake/alert    Balance     End of Session PT - End of Session Equipment Utilized During Treatment: Gait belt;Right knee immobilizer Activity Tolerance: Patient tolerated treatment well Patient left: with family/visitor present;with call bell/phone within reach;in bed Nurse Communication: Mobility status CPM Right Knee CPM Right Knee: Off   GP     [name] 07/10/2012, 12:17 PM

## 2012-07-10 NOTE — Progress Notes (Signed)
Speech Language Pathology Dysphagia Treatment Patient Details Name: IYAD DEROO MRN: [id number] DOB: [birth year] Today's Date: 07/10/2012 Time: 1478-2956 SLP Time Calculation (min): 15 min  Assessment / Plan / Recommendation Clinical Impression  Pt seen for skilled dysphagia tx to assess tolerance of po diet and for education of compensatory strategies.  Pt denies any further episodes of "choking" since incident with eggs.  Rec continue to monitor tolerance of po diet and pursue further testing in future if indicated.  All education completed and pt appears to be tolerating po diet, therefore slp to sign off.  Thanks.      Diet Recommendation  Continue with Current Diet: Regular;Thin liquid    SLP Plan All goals met   Pertinent Vitals/Pain Afebrile, decreased   Swallowing Goals  SLP Swallowing Goals Swallow Study Goal #2 - Progress: Met  General Temperature Spikes Noted: No Respiratory Status: Room air Behavior/Cognition: Alert;Cooperative;Pleasant mood Oral Cavity - Dentition: Adequate natural dentition Patient Positioning: Upright in chair  Oral Cavity - Oral Hygiene   oral cavity clear, pt reports xerostomia  Dysphagia Treatment Treatment focused on: Skilled observation of diet tolerance Treatment Methods/Modalities: Skilled observation Patient observed directly with PO's: Yes Type of PO's observed: Thin liquids Feeding: Able to feed self Liquids provided via: Cup Type of cueing: Verbal Amount of cueing: Minimal   GO     Donavan Burnet, MS [facility] SLP [phone]

## 2012-07-10 NOTE — Progress Notes (Signed)
Clinical Social Work Department CLINICAL SOCIAL WORK PLACEMENT NOTE 07/10/2012  Patient:  BLAS, RICHES  Account Number:  [id number] Admit date:  07/06/2012  Clinical Social Worker:  Unk Lightning, LCSW  Date/time:  07/09/2012 10:00 AM  Clinical Social Work is seeking post-discharge placement for this patient at the following level of care:   SKILLED NURSING   (*CSW will update this form in Epic as items are completed)   07/09/2012  Patient/family provided with [facility] Department of Clinical Social Work's list of facilities offering this level of care within the geographic area requested by the patient (or if unable, by the patient's family).  07/09/2012  Patient/family informed of their freedom to choose among providers that offer the needed level of care, that participate in Medicare, Medicaid or managed care program needed by the patient, have an available bed and are willing to accept the patient.  07/09/2012  Patient/family informed of [facility]' ownership interest in [facility], as well as of the fact that they are under no obligation to receive care at this facility.  PASARR submitted to EDS on  PASARR number received from [employer] on   FL2 transmitted to all facilities in geographic area requested by pt/family on  07/09/2012 FL2 transmitted to all facilities within larger geographic area on   Patient informed that his/her managed care company has contracts with or will negotiate with  certain facilities, including the following:     Patient/family informed of bed offers received:  07/10/2012 Patient chooses bed at [facility] Physician recommends and patient chooses bed at    Patient to be transferred to [facility] on  07/10/2012 Patient to be transferred to facility by [facility]  The following physician request were entered in Epic:   Additional Comments:  Cori Razor LCSW [phone]

## 2012-07-10 NOTE — Progress Notes (Addendum)
Subjective: 4 Days Post-Op Procedure(s) (LRB): RIGHT TOTAL KNEE ARTHROPLASTY (Right) Patient reports pain as mild.  No c/o this AM.   Objective: Vital signs in last 24 hours: Temp:  [97.8 F (36.6 C)-98.9 F (37.2 C)] 98.4 F (36.9 C) (04/29 0540) Pulse Rate:  [97-111] 97 (04/29 0540) Resp:  [16-20] 16 (04/29 0540) BP: (114-134)/(68-76) 114/68 mmHg (04/29 0540) SpO2:  [95 %-97 %] 96 % (04/29 0540)  Intake/Output from previous day: 04/28 0701 - 04/29 0700 In: 1080 [P.O.:1080] Out: 325 [Urine:325] Intake/Output this shift:     Recent Labs  07/08/12 0520 07/09/12 0425  HGB 10.8* 10.0*    Recent Labs  07/08/12 0520 07/09/12 0425  WBC 12.4* 12.2*  RBC 3.76* 3.41*  HCT 33.4* 30.2*  PLT 189 188   No results found for this basename: NA, K, CL, CO2, BUN, CREATININE, GLUCOSE, CALCIUM,  in the last 72 hours No results found for this basename: LABPT, INR,  in the last 72 hours  Neurologically intact ABD soft Neurovascular intact Sensation intact distally Intact pulses distally Dorsiflexion/Plantar flexion intact Incision: dressing C/D/I and no drainage No cellulitis present Compartment soft RLE edema and diffuse tenderness Negative homans, no DVT  Assessment/Plan: 4 Days Post-Op Procedure(s) (LRB): RIGHT TOTAL KNEE ARTHROPLASTY (Right) Advance diet Up with therapy Discharge to SNF today Compression stockings, ice and elevation for swelling RLE Reviewed D/C instructions Dressing change prior to D/C D/C summary in chart Will discuss with Dr. Elissa Lovett, Dayna Barker. 07/10/2012, 7:41 AM

## 2012-07-12 DIAGNOSIS — L0291 Cutaneous abscess, unspecified: Secondary | ICD-10-CM | POA: Diagnosis not present

## 2012-07-12 DIAGNOSIS — L0390 Cellulitis, unspecified: Secondary | ICD-10-CM | POA: Diagnosis not present

## 2012-07-12 DIAGNOSIS — Z96659 Presence of unspecified artificial knee joint: Secondary | ICD-10-CM | POA: Diagnosis not present

## 2012-07-13 LAB — GLUCOSE, CAPILLARY
Glucose-Capillary: 104 mg/dL — ABNORMAL HIGH (ref 70–99)
Glucose-Capillary: 191 mg/dL — ABNORMAL HIGH (ref 70–99)

## 2012-07-13 NOTE — H&P (Signed)
Gregory Rangel MRN: [id number] DOB/AGE: [birth year] 79 y.o. Primary Care Physician:Kepler Mccabe L, MD Admit date: 07/10/2012 Chief Complaint: Knee replacement HPI: This is a 79 year old who had knee replacement several days ago. He has been brought to the skilled care facility for rehabilitation. He says he feels okay but I was called last night because he had erythema and warmth at his surgical site and also further down on his leg. He was started on doxycycline until I could see him. He says he has pain in his knee which is to be expected. He has not had any drainage. He has no other new complaints. He has multiple other medical problems including hypertension diabetes GERD severe osteoarthritis and chronic renal failure.  Past Medical History  Diagnosis Date  . Hypertension   . Shortness of breath     with exertion  . Diabetes mellitus without complication   . GERD (gastroesophageal reflux disease)   . Arthritis    Past Surgical History  Procedure Laterality Date  . Back surgery      x 2  . Tonsillectomy    . Appendectomy    . Cholecystectomy    . Eye surgery      right eye with lens implant  . Right knee arthroscopy    . Total knee arthroplasty Right 07/06/2012    Procedure: RIGHT TOTAL KNEE ARTHROPLASTY;  Surgeon: Javier Docker, MD;  Location: [facility];  Service: Orthopedics;  Laterality: Right;        No family history on file.  Social History:  reports that he quit smoking about 29 years ago. He does not have any smokeless tobacco history on file. He reports that he does not drink alcohol or use illicit drugs.   Allergies:  Allergies  Allergen Reactions  . Penicillins Hives, Swelling and Rash    Medications Prior to Admission  Medication Sig Dispense Refill  . acetaminophen (TYLENOL) 500 MG tablet Take 500 mg by mouth every 6 (six) hours as needed for pain.      Marland Kitchen amLODipine (NORVASC) 10 MG tablet Take 10 mg by mouth every morning.      . benazepril (LOTENSIN)  40 MG tablet Take 40 mg by mouth daily with supper.      . docusate sodium (COLACE) 100 MG capsule Take 200 mg by mouth daily as needed for constipation.      Marland Kitchen glipiZIDE (GLUCOTROL XL) 10 MG 24 hr tablet Take 10 mg by mouth every morning.      Marland Kitchen losartan (COZAAR) 100 MG tablet Take 100 mg by mouth every evening.      Marland Kitchen oxyCODONE-acetaminophen (PERCOCET) 5-325 MG per tablet Take 1 tablet by mouth every 4 (four) hours as needed for pain.  60 tablet  0  . rivaroxaban (XARELTO) 10 MG TABS tablet Take 1 tablet (10 mg total) by mouth daily.  21 tablet  0       XBJ:YNWGN from the symptoms mentioned above,there are no other symptoms referable to all systems reviewed.  Physical Exam: There were no vitals taken for this visit. He is awake and alert. His HEENT examination is essentially unremarkable. His neck is supple. His chest is clear. His heart is regular without gallop. His abdomen is soft without masses bowel sounds are present and active. Extremities show on the right he has a surgical scar from his knee replacement and the skin around this area is somewhat erythematous and warm. I cannot express anything from a surgical scar. About 10 cm  below the surgical scar he has erythematous warm tissue and he has a small 1.5 cm ulceration along the tibia. This does not have any purulent material. He has edema of both legs. His central nervous system exam is grossly intact   No results found for this basename: WBC, NEUTROABS, HGB, HCT, MCV, PLT,  in the last 72 hours No results found for this basename: NA, K, CL, CO2, GLUCOSE, BUN, CREATININE, CALCIUM, MG, PHO,  in the last 72 hourslablast2(ast:2,ALT:2,alkphos:2,bilitot:2,prot:2,albumin:2)@    No results found for this or any previous visit (from the past 240 hour(s)).   Dg Chest 2 View  06/27/2012  *RADIOLOGY REPORT*  Clinical Data: Preop for right knee arthroplasty  CHEST - 2 VIEW  Comparison: Chest radiograph 10/04/2010 and 08/21/2009  Findings: The  heart size is within normal limits.  Mediastinal and hilar contours are stable and within normal limits.  Left juxtacardiac fat pad is again noted.  Lungs are mildly hyperinflated clear.  Negative for airspace disease or pleural effusion. There are degenerative changes of the thoracic spine. Surgical changes of the spine are partially visualized in the upper lumbar region.  IMPRESSION: Stable chest radiograph.  Mild hyperinflation appears similar to prior exam.  No acute cardiopulmonary disease identified.   Original Report Authenticated By: Britta Mccreedy, M.D.    Dg Knee 1-2 Views Right  07/06/2012  *RADIOLOGY REPORT*  Clinical Data: Status post knee replacement.  RIGHT KNEE - 1-2 VIEW  Comparison: Plain films 06/17/2012.  Findings: The patient has a new right total knee arthroplasty.  The device is located and there is no fracture.  Small amount of gas in the soft tissues, surgical drain and staples are noted.  IMPRESSION: Right knee replacement without evidence of complication.   Original Report Authenticated By: Holley Dexter, M.D.    Dg Knee 1-2 Views Right  06/27/2012  *RADIOLOGY REPORT*  Clinical Data: Right knee degenerative joint disease.  Preop for total knee arthroplasty  RIGHT KNEE - 1-2 VIEW  Comparison: Right knee radiographs 12/18/2009  Findings: There is diffuse bony demineralization.  There is joint space narrowing of the medial compartment with small marginal spurring.  Spur formation is seen in the anterior distal femur and both poles of the patella.  Lateral compartment appears maintained. No acute bony abnormality is identified.  Scattered vascular calcifications are noted.  No definite joint effusion or focal soft tissue swelling appreciated.  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   Original Report Authenticated By: Britta Mccreedy, M.D.    Impression: He had knee replacement. He has some inflammatory changes in his leg he may have  cellulitis. He has a history of diabetes and of chronic renal failure. Active Problems:   * No active hospital problems. *     Plan: I started him on vancomycin. I have asked that his surgeon Dr. Shelle Iron be notified in case he wants to look at the surgical site. Because of his renal failure vancomycin will be managed by pharmacy I will discontinue doxycycline      Pluma Diniz L Pager [phone]  07/13/2012, 8:29 AM

## 2012-07-14 LAB — GLUCOSE, CAPILLARY
Glucose-Capillary: 127 mg/dL — ABNORMAL HIGH (ref 70–99)
Glucose-Capillary: 183 mg/dL — ABNORMAL HIGH (ref 70–99)

## 2012-07-16 LAB — GLUCOSE, CAPILLARY: Glucose-Capillary: 177 mg/dL — ABNORMAL HIGH (ref 70–99)

## 2012-07-17 LAB — GLUCOSE, CAPILLARY: Glucose-Capillary: 132 mg/dL — ABNORMAL HIGH (ref 70–99)

## 2012-07-18 LAB — GLUCOSE, CAPILLARY
Glucose-Capillary: 106 mg/dL — ABNORMAL HIGH (ref 70–99)
Glucose-Capillary: 227 mg/dL — ABNORMAL HIGH (ref 70–99)

## 2012-07-19 ENCOUNTER — Other Ambulatory Visit (HOSPITAL_COMMUNITY): Payer: Self-pay | Admitting: Specialist

## 2012-07-19 ENCOUNTER — Ambulatory Visit (HOSPITAL_COMMUNITY): Payer: Medicare Other | Attending: Cardiovascular Disease

## 2012-07-19 DIAGNOSIS — Z96659 Presence of unspecified artificial knee joint: Secondary | ICD-10-CM | POA: Diagnosis not present

## 2012-07-19 DIAGNOSIS — M7989 Other specified soft tissue disorders: Secondary | ICD-10-CM

## 2012-07-19 DIAGNOSIS — M25561 Pain in right knee: Secondary | ICD-10-CM

## 2012-07-19 NOTE — Progress Notes (Signed)
Right Venous Duplex Lower Ext. Completed. Preliminary report by tech: No evidence of acute DVT noted. There is a small amount of chronic appearing DVT noted in the right popliteal vein. Chauncy Lean

## 2012-07-20 ENCOUNTER — Ambulatory Visit (HOSPITAL_COMMUNITY)
Admit: 2012-07-20 | Discharge: 2012-07-20 | Disposition: A | Discharge status: Home / Self Care | Payer: Medicare Other | Source: Ambulatory Visit | Attending: Pulmonary Disease | Admitting: Pulmonary Disease

## 2012-07-20 DIAGNOSIS — M7989 Other specified soft tissue disorders: Secondary | ICD-10-CM | POA: Insufficient documentation

## 2012-07-20 DIAGNOSIS — I824Y9 Acute embolism and thrombosis of unspecified deep veins of unspecified proximal lower extremity: Secondary | ICD-10-CM | POA: Insufficient documentation

## 2012-07-20 DIAGNOSIS — M79609 Pain in unspecified limb: Secondary | ICD-10-CM | POA: Diagnosis not present

## 2012-07-23 LAB — GLUCOSE, CAPILLARY
Glucose-Capillary: 222 mg/dL — ABNORMAL HIGH (ref 70–99)
Glucose-Capillary: 94 mg/dL (ref 70–99)
Glucose-Capillary: 246 mg/dL — ABNORMAL HIGH (ref 70–99)
Glucose-Capillary: 82 mg/dL (ref 70–99)

## 2012-07-25 LAB — GLUCOSE, CAPILLARY

## 2012-07-26 ENCOUNTER — Ambulatory Visit (HOSPITAL_COMMUNITY)
Admit: 2012-07-26 | Discharge: 2012-07-26 | Disposition: A | Discharge status: Home / Self Care | Payer: Medicare Other | Source: Ambulatory Visit | Attending: Pulmonary Disease | Admitting: Pulmonary Disease

## 2012-07-26 ENCOUNTER — Inpatient Hospital Stay (HOSPITAL_COMMUNITY)
Admit: 2012-07-26 | Discharge: 2012-07-26 | Disposition: A | Discharge status: Home / Self Care | Payer: Medicare Other | Attending: Speech Pathology | Admitting: Speech Pathology

## 2012-07-26 DIAGNOSIS — IMO0001 Reserved for inherently not codable concepts without codable children: Secondary | ICD-10-CM | POA: Insufficient documentation

## 2012-07-26 DIAGNOSIS — R1310 Dysphagia, unspecified: Secondary | ICD-10-CM | POA: Insufficient documentation

## 2012-07-26 NOTE — Procedures (Signed)
Objective Swallowing Evaluation: Modified Barium Swallowing Study  Patient Details  Name: Gregory Rangel MRN: [id number] Date of Birth: [birth year]  Today's Date: 07/26/2012 Time:  -     Past Medical History:  Past Medical History  Diagnosis Date  . Hypertension   . Shortness of breath     with exertion  . Diabetes mellitus without complication   . GERD (gastroesophageal reflux disease)   . Arthritis    Past Surgical History:  Past Surgical History  Procedure Laterality Date  . Back surgery      x 2  . Tonsillectomy    . Appendectomy    . Cholecystectomy    . Eye surgery      right eye with lens implant  . Right knee arthroscopy    . Total knee arthroplasty Right 07/06/2012    Procedure: RIGHT TOTAL KNEE ARTHROPLASTY;  Surgeon: Javier Docker, MD;  Location: [facility];  Service: Orthopedics;  Laterality: Right;   HPI:  Gregory Rangel is a 79 yo man who recently underwent total knee replacement at [facility] and is now at [facility] for rehab. He was evaluated at [facility] clinically for swallow due to complaint of choking episode with eggs (coughed up eggs with frothy secretions) suspicious for esophageal dysphagia. Objective testing was not completed at that time. He is here today for MBSS due to similar complaints of difficulty with solids.  Symptoms/Limitations Symptoms: Occasional choking episodes on solid foods Special Tests: MBSS  Recommendation/Prognosis  Clinical Impression Dysphagia Diagnosis: Mild pharyngeal phase dysphagia;Suspected primary esophageal dysphagia Clinical impression: Oropharyngeal swallow essentially WFL for puree, liquids, and thoroghly masticated solids until pt presented with barium tablet (12.55mm) which traversed upper esophagus without delay, but became lodged in distal esophagus. The pill did not clear with sips of liquid, however liquids did pass around pill. The pt was then given puree (almost 3 ounces) which filled esophagus and did not clear. Pt then given  straw sips of thin which pt penetrated and aspirated x 1 in attempt to clear the esophagus. Oropharyngeal swallow negatively impacted by narrowing in esophagus which did not allow 12.5 mm tablet to pass. Strongly recommend GI consult (he has seen Dr. Karilyn Cota in the past) for EGD with possible dilation. Swallow Evaluation Recommendations Recommended Consults: Consider GI evaluation;Consider esophageal assessment Diet Recommendations: Dysphagia 3 (Mechanical Soft);Thin liquid (care with meats and breads until seen by GI) Liquid Administration via: Cup Medication Administration: Whole meds with liquid Supervision: Patient able to self feed Compensations: Multiple dry swallows after each bite/sip Postural Changes and/or Swallow Maneuvers: Out of bed for meals;Seated upright 90 degrees;Upright 30-60 min after meal Oral Care Recommendations: Oral care BID Other Recommendations: Clarify dietary restrictions Follow up Recommendations: Skilled Nursing facility   Individuals Consulted Consulted and Agree with Results and Recommendations: Patient Report Sent to : Referring physician (and Dr. Karilyn Cota per pt request)  SLP Assessment/Plan Dysphagia Diagnosis: Mild pharyngeal phase dysphagia;Suspected primary esophageal dysphagia Clinical impression: Oropharyngeal swallow essentially WFL for puree, liquids, and thoroghly masticated solids until pt presented with barium tablet (12.67mm) which traversed upper esophagus without delay, but became lodged in distal esophagus. The pill did not clear with sips of liquid, however liquids did pass around pill. The pt was then given puree (almost 3 ounces) which filled esophagus and did not clear. Pt then given straw sips of thin which pt penetrated and aspirated x 1 in attempt to clear the esophagus. Oropharyngeal swallow negatively impacted by narrowing in esophagus which did not allow 12.5  mm tablet to pass. Strongly recommend GI consult (he has seen Dr. Karilyn Cota in the  past) for EGD with possible dilation.  General:  Date of Onset: 07/09/12 HPI: Gregory Rangel is a 79 yo man who recently underwent total knee replacement at [facility] and is now at [facility] for rehab. He was evaluated at [facility] clinically for swallow due to complaint of choking episode with eggs (coughed up eggs with frothy secretions) suspicious for esophageal dysphagia. Objective testing was not completed at that time. He is here today for MBSS due to similar complaints of difficulty with solids. Type of Study: Modified Barium Swallowing Study Reason for Referral: Objectively evaluate swallowing function Previous Swallow Assessment: BSE 07/09/2012 at [facility] Diet Prior to this Study: Regular;Thin liquids Temperature Spikes Noted: No Respiratory Status: Room air History of Recent Intubation: No Behavior/Cognition: Alert;Cooperative;Pleasant mood Oral Cavity - Dentition: Adequate natural dentition (missing back, lower molars) Oral Motor / Sensory Function: Within functional limits Self-Feeding Abilities: Able to feed self Patient Positioning: Upright in chair Baseline Vocal Quality: Clear Volitional Cough: Strong Volitional Swallow: Able to elicit Anatomy: Within functional limits Pharyngeal Secretions: Not observed secondary MBS  Reason for Referral:  Objectively evaluate swallowing function   Oral Phase Oral Preparation/Oral Phase Oral Phase: WFL (prolonged oral phase with extensive mastication-baseline) Pharyngeal Phase  Pharyngeal Phase Pharyngeal Phase: Impaired Pharyngeal - Thin Pharyngeal - Thin Teaspoon: Within functional limits Pharyngeal - Thin Cup: Within functional limits Pharyngeal - Thin Straw: Premature spillage to valleculae;Penetration/Aspiration after swallow;Trace aspiration;Lateral channel residue;Reduced airway/laryngeal closure;Other (Comment) (only abnormal once esophagus filled) Penetration/Aspiration details (thin straw): Material does not enter airway;Material enters  airway, passes BELOW cords then ejected out Pharyngeal - Solids Pharyngeal - Puree: Within functional limits Pharyngeal - Regular: Within functional limits Pharyngeal - Pill: Within functional limits (multiple sips to clear) Cervical Esophageal Phase  Cervical Esophageal Phase Cervical Esophageal Phase: Impaired Cervical Esophageal Phase - Solids Pill: Esophageal backflow into cervical esophagus Cervical Esophageal Phase - Comment Cervical Esophageal Comment: Pill became lodged in distal esophagus and additional puree given after the pill remained in upper esophagus. See below in clinical impression   G Code: Swallowing: Initial: CJ, Goal: CH, Discharge: CJ (needs to see GI)  Thank you,  Havery Moros, CCC-SLP [phone]  Tramar Brueckner 07/26/2012, 3:27 PM

## 2012-07-28 LAB — GLUCOSE, CAPILLARY: Glucose-Capillary: 196 mg/dL — ABNORMAL HIGH (ref 70–99)

## 2012-07-29 LAB — GLUCOSE, CAPILLARY: Glucose-Capillary: 82 mg/dL (ref 70–99)

## 2012-07-30 LAB — GLUCOSE, CAPILLARY: Glucose-Capillary: 84 mg/dL (ref 70–99)

## 2012-07-31 ENCOUNTER — Ambulatory Visit (INDEPENDENT_AMBULATORY_CARE_PROVIDER_SITE_OTHER): Payer: Medicare Other | Admitting: Internal Medicine

## 2012-07-31 ENCOUNTER — Encounter (INDEPENDENT_AMBULATORY_CARE_PROVIDER_SITE_OTHER): Payer: Self-pay | Admitting: Internal Medicine

## 2012-07-31 ENCOUNTER — Encounter (INDEPENDENT_AMBULATORY_CARE_PROVIDER_SITE_OTHER): Payer: Self-pay | Admitting: *Deleted

## 2012-07-31 ENCOUNTER — Other Ambulatory Visit (INDEPENDENT_AMBULATORY_CARE_PROVIDER_SITE_OTHER): Payer: Self-pay | Admitting: *Deleted

## 2012-07-31 VITALS — BP 110/46 | HR 100 | Ht 72.0 in | Wt 226.7 lb

## 2012-07-31 DIAGNOSIS — R1314 Dysphagia, pharyngoesophageal phase: Secondary | ICD-10-CM | POA: Diagnosis not present

## 2012-07-31 DIAGNOSIS — I82409 Acute embolism and thrombosis of unspecified deep veins of unspecified lower extremity: Secondary | ICD-10-CM

## 2012-07-31 DIAGNOSIS — R1319 Other dysphagia: Secondary | ICD-10-CM

## 2012-07-31 DIAGNOSIS — R1310 Dysphagia, unspecified: Secondary | ICD-10-CM | POA: Diagnosis not present

## 2012-07-31 DIAGNOSIS — I82401 Acute embolism and thrombosis of unspecified deep veins of right lower extremity: Secondary | ICD-10-CM

## 2012-07-31 LAB — GLUCOSE, CAPILLARY: Glucose-Capillary: 81 mg/dL (ref 70–99)

## 2012-07-31 NOTE — Patient Instructions (Addendum)
EGD/ED. The risks and benefits such as perforation, bleeding, and infection were reviewed with the patient and is agreeable. 

## 2012-07-31 NOTE — Addendum Note (Signed)
Addended by: Len Blalock on: 07/31/2012 02:49 PM   Modules accepted: Orders

## 2012-07-31 NOTE — Progress Notes (Addendum)
Subjective:     Patient ID: Gregory Rangel, male   DOB: 1933/07/22, 79 y.o.   MRN: [id number]  HPI Referred to our office for dysphagia. He choked on 07/08/2012.  Food lodged in his esophagus. Recently underwent a Modified barium swallow and pill lodged. Course BB, roast beef will lodge. Breads will lodge. He has had symptoms for a couple of years. Weight loss 13 pounds since his rt knee replacement last month.  Presently in rehab at the [facility]. After knee surgery he developed a DVT and placed on Coumadin.  Appetite is good. He usually has a BM every couple of days. Hx of colon cancer in 1998 and underwent surgery.  Last colonoscopy 4 yrs ago by Dr. Karilyn Rangel and was normal.   07/26/2012 Modified Barium Swallow:  Dysphagia Diagnosis: Mild pharyngeal phase dysphagia;Suspected primary esophageal dysphagia  Clinical impression: Oropharyngeal swallow essentially WFL for puree, liquids, and thoroghly masticated solids until pt presented with barium tablet (12.24mm) which traversed upper esophagus without delay, but became lodged in distal esophagus. The pill did not clear with sips of liquid, however liquids did pass around pill. The pt was then given puree (almost 3 ounces) which filled esophagus and did not clear. Pt then given straw sips of thin which pt penetrated and aspirated x 1 in attempt to clear the esophagus. Oropharyngeal swallow negatively impacted by narrowing in esophagus which did not allow 12.5 mm tablet to pass. Strongly recommend GI consult (he has seen Dr. Karilyn Rangel in the past) for EGD with possible dilation.  Swallow Evaluation Recommendations  Recommended Consults: Consider GI evaluation;Consider esophageal assessment   Review of Systems see hpi No current outpatient prescriptions on file.   No current facility-administered medications for this visit.   Current Outpatient Prescriptions on File Prior to Visit  Medication Sig Dispense Refill  . amLODipine (NORVASC) 10 MG tablet  Take 10 mg by mouth every morning.      . benazepril (LOTENSIN) 40 MG tablet Take 40 mg by mouth daily with supper.      Marland Kitchen glipiZIDE (GLUCOTROL XL) 10 MG 24 hr tablet Take 10 mg by mouth every morning.      Marland Kitchen losartan (COZAAR) 100 MG tablet Take 100 mg by mouth every evening.      Marland Kitchen oxyCODONE-acetaminophen (PERCOCET) 5-325 MG per tablet Take 1 tablet by mouth every 4 (four) hours as needed for pain.  60 tablet  0   No current facility-administered medications on file prior to visit.   Past Medical History  Diagnosis Date  . Hypertension   . Shortness of breath     with exertion  . Diabetes mellitus without complication   . GERD (gastroesophageal reflux disease)   . Arthritis    Past Surgical History  Procedure Laterality Date  . Back surgery      x 2  . Tonsillectomy    . Appendectomy    . Cholecystectomy    . Eye surgery      right eye with lens implant  . Right knee arthroscopy    . Total knee arthroplasty Right 07/06/2012    Procedure: RIGHT TOTAL KNEE ARTHROPLASTY;  Surgeon: Gregory Docker, MD;  Location: [facility];  Service: Orthopedics;  Laterality: Right;   Allergies  Allergen Reactions  . Penicillins Hives, Swelling and Rash        Objective:   Physical Exam  Filed Vitals:   07/31/12 1413  BP: 110/46  Pulse: 100  Height: 6' (1.829 m)  Weight:  226 lb 11.2 oz (102.83 kg)        Assessment:   Dysphagia to solids. Abnormal Swallow test and pill lodged in the distal esophagus. Discussed with Dr. Karilyn Rangel since patient had recent DVT    Plan:     EGD/ED.The risks and benefits such as perforation, bleeding, and infection were reviewed with the patient and is agreeable. Patient will be bridged with Lovenox.  Dr. Karilyn Rangel discussed with Dr. Juanetta Gosling. 1mg /kg BID x 3 days. Will first get a creatinine.

## 2012-08-02 LAB — GLUCOSE, CAPILLARY: Glucose-Capillary: 182 mg/dL — ABNORMAL HIGH (ref 70–99)

## 2012-08-03 LAB — GLUCOSE, CAPILLARY: Glucose-Capillary: 163 mg/dL — ABNORMAL HIGH (ref 70–99)

## 2012-08-04 LAB — GLUCOSE, CAPILLARY: Glucose-Capillary: 83 mg/dL (ref 70–99)

## 2012-08-05 LAB — GLUCOSE, CAPILLARY: Glucose-Capillary: 121 mg/dL — ABNORMAL HIGH (ref 70–99)

## 2012-08-06 LAB — GLUCOSE, CAPILLARY
Glucose-Capillary: 68 mg/dL — ABNORMAL LOW (ref 70–99)
Glucose-Capillary: 149 mg/dL — ABNORMAL HIGH (ref 70–99)

## 2012-08-07 LAB — GLUCOSE, CAPILLARY
Glucose-Capillary: 74 mg/dL (ref 70–99)
Glucose-Capillary: 157 mg/dL — ABNORMAL HIGH (ref 70–99)

## 2012-08-08 LAB — GLUCOSE, CAPILLARY: Glucose-Capillary: 72 mg/dL (ref 70–99)

## 2012-08-09 ENCOUNTER — Encounter (HOSPITAL_COMMUNITY): Payer: Self-pay | Admitting: *Deleted

## 2012-08-09 ENCOUNTER — Encounter (HOSPITAL_COMMUNITY)
Admission: RE | Disposition: A | Discharge status: Home / Self Care | Payer: Self-pay | Source: Ambulatory Visit | Attending: Internal Medicine

## 2012-08-09 ENCOUNTER — Inpatient Hospital Stay
Admission: RE | Admit: 2012-08-09 | Discharge: 2012-08-17 | Disposition: A | Discharge status: Home / Self Care | Payer: PRIVATE HEALTH INSURANCE | Source: Ambulatory Visit | Attending: Pulmonary Disease | Admitting: Pulmonary Disease

## 2012-08-09 ENCOUNTER — Ambulatory Visit (HOSPITAL_COMMUNITY)
Admission: RE | Admit: 2012-08-09 | Discharge: 2012-08-09 | Disposition: A | Discharge status: Home / Self Care | Payer: Medicare Other | Source: Ambulatory Visit | Attending: Internal Medicine | Admitting: Internal Medicine

## 2012-08-09 DIAGNOSIS — K449 Diaphragmatic hernia without obstruction or gangrene: Secondary | ICD-10-CM | POA: Diagnosis not present

## 2012-08-09 DIAGNOSIS — I10 Essential (primary) hypertension: Secondary | ICD-10-CM | POA: Insufficient documentation

## 2012-08-09 DIAGNOSIS — D131 Benign neoplasm of stomach: Secondary | ICD-10-CM | POA: Diagnosis not present

## 2012-08-09 DIAGNOSIS — E119 Type 2 diabetes mellitus without complications: Secondary | ICD-10-CM | POA: Insufficient documentation

## 2012-08-09 DIAGNOSIS — K222 Esophageal obstruction: Secondary | ICD-10-CM

## 2012-08-09 DIAGNOSIS — K2960 Other gastritis without bleeding: Secondary | ICD-10-CM | POA: Insufficient documentation

## 2012-08-09 DIAGNOSIS — R1310 Dysphagia, unspecified: Secondary | ICD-10-CM | POA: Diagnosis not present

## 2012-08-09 DIAGNOSIS — K210 Gastro-esophageal reflux disease with esophagitis: Secondary | ICD-10-CM

## 2012-08-09 DIAGNOSIS — R1319 Other dysphagia: Secondary | ICD-10-CM

## 2012-08-09 DIAGNOSIS — Z01812 Encounter for preprocedural laboratory examination: Secondary | ICD-10-CM | POA: Insufficient documentation

## 2012-08-09 HISTORY — PX: ESOPHAGOGASTRODUODENOSCOPY (EGD) WITH ESOPHAGEAL DILATION: SHX5812

## 2012-08-09 SURGERY — ESOPHAGOGASTRODUODENOSCOPY (EGD) WITH ESOPHAGEAL DILATION
Anesthesia: Moderate Sedation

## 2012-08-09 MED ORDER — PANTOPRAZOLE SODIUM 40 MG PO TBEC: 40.0000 mg | DELAYED_RELEASE_TABLET | Freq: Every day | ORAL | Status: DC

## 2012-08-09 MED ORDER — MIDAZOLAM HCL 5 MG/5ML IJ SOLN
INTRAMUSCULAR | Status: DC | PRN
  Administered 2012-08-09 (×2): 2 mg via INTRAVENOUS
  Administered 2012-08-09: 1 mg via INTRAVENOUS

## 2012-08-09 MED ORDER — MEPERIDINE HCL 50 MG/ML IJ SOLN
INTRAMUSCULAR | Status: AC
  Filled 2012-08-09: qty 1

## 2012-08-09 MED ORDER — MEPERIDINE HCL 25 MG/ML IJ SOLN
INTRAMUSCULAR | Status: DC | PRN
  Administered 2012-08-09 (×2): 25 mg via INTRAVENOUS

## 2012-08-09 MED ORDER — VANCOMYCIN HCL IN DEXTROSE 1-5 GM/200ML-% IV SOLN
INTRAVENOUS | Status: AC
  Filled 2012-08-09: qty 200

## 2012-08-09 MED ORDER — MIDAZOLAM HCL 5 MG/5ML IJ SOLN
INTRAMUSCULAR | Status: AC
  Filled 2012-08-09: qty 10

## 2012-08-09 MED ORDER — CIPROFLOXACIN IN D5W 400 MG/200ML IV SOLN
400.0000 mg | Freq: Once | INTRAVENOUS | Status: AC
  Administered 2012-08-09: 400 mg via INTRAVENOUS

## 2012-08-09 MED ORDER — VANCOMYCIN HCL IN DEXTROSE 1-5 GM/200ML-% IV SOLN
1000.0000 mg | Freq: Once | INTRAVENOUS | Status: AC
  Administered 2012-08-09: 1000 mg via INTRAVENOUS

## 2012-08-09 MED ORDER — STERILE WATER FOR IRRIGATION IR SOLN
Status: DC | PRN
  Administered 2012-08-09: 10:00:00

## 2012-08-09 MED ORDER — CIPROFLOXACIN IN D5W 400 MG/200ML IV SOLN
INTRAVENOUS | Status: AC
  Filled 2012-08-09: qty 200

## 2012-08-09 MED ORDER — SODIUM CHLORIDE 0.9 % IV SOLN
INTRAVENOUS | Status: DC
  Administered 2012-08-09: 08:00:00 via INTRAVENOUS

## 2012-08-09 NOTE — Op Note (Signed)
EGD PROCEDURE REPORT  PATIENT:  Gregory Rangel  MR#:  [id number] Birthdate:  1933/07/30, 79 y.o., male Endoscopist:  Dr. Malissa Hippo, MD Referred By:  Dr. Oneal Deputy. Juanetta Gosling, MD  Procedure Date: 08/09/2012  Procedure:   EGD with ED(ballon).  Indications:  Patient is 79 year old Caucasian male who presents with solid-state dysphagia a few months duration. It is occurring more frequently now up. He denies frequent heartburn.            Informed Consent:  The risks, benefits, alternatives & imponderables which include, but are not limited to, bleeding, infection, perforation, drug reaction and potential missed lesion have been reviewed.  The potential for biopsy, lesion removal, esophageal dilation, etc. have also been discussed.  Questions have been answered.  All parties agreeable.  Please see history & physical in medical record for more information.  Medications:  Demerol 50 mg IV Versed 5 mg IV Cetacaine spray topically for oropharyngeal anesthesia Patient was also given vancomycin 1 g IV and Cipro 4 mg IV for antimicrobial prophylaxis.  Description of procedure:  The endoscope was introduced through the mouth and advanced to the second portion of the duodenum without difficulty or limitations. The mucosal surfaces were surveyed very carefully during advancement of the scope and upon withdrawal.  Findings:  Esophagus:  Mucosa of the esophagus was normal except edema and erythema with stricture at GE junction. Some resistance noted as the scope was passed across it. GEJ:  36 cm Hiatus:  40 cm Stomach:  Stomach was empty and distended very well with insufflation. Folds in the proximal stomach were normal. There were  few small polyps in gastric body with features of hyperplastic polyps. These were left alone. Antral mucosa was covered with specks of coffee-ground material;  there was focal erythema and edema but no erosions or ulcers noted; prepyloric scar noted. Pyloric channel was  patent. Angularis fundus and cardia were examined by retroflex in the scope and were normal. Duodenum:  Normal bulbar and post bulbar mucosa.  Therapeutic/Diagnostic Maneuvers Performed:   Stricture at GE junction was dilated with balloon dilator. Balloon dilator was passed with the scope. Guidewire was pushed into the gastric lumen. Dilator was then positioned across the stricture and insufflated to a diameter of 15, 16.5 and finally to 18 mm resulting in mucosal disruption. Balloon was deflated and withdrawn. I was able to pass the scope across the stricture with minimal resistance.  Complications:  None  Impression: Stricture at GE junction with changes of reflux esophagitis. This stricture was dilated to 18 mm with balloon dilator. Small sliding hiatal hernia. Few hyperplastic appearing polyps at gastric body. Antral gastritis and prepyloric scar.  Recommendations:  Pantoprazole 40 mg by mouth every morning. H. pylori serology. Patient will resume warfarin at usual dose today and get INR checked in 10-14 days. Lovenox 100 mg subcutaneous daily for 3 days starting in a.m. I will contact patient with results of blood test.  Tymesha Ditmore U  08/09/2012  10:29 AM  CC: Dr. Fredirick Maudlin, MD & Dr. Bonnetta Barry ref. provider found

## 2012-08-09 NOTE — Progress Notes (Signed)
Pt. Here for EGD/ED.  Pt. Stated that he was post-op right knee replacement a month ago.  Dr. Karilyn Cota aware & antibiotics were ordered, Cipro 400mg  IV & Vancomycin 1000mg  IV.  Pt. Stated that he had been taking Coumadin. Verified with the [facility] & pt. Stopped Coumadin 07/30/12 & Lovenox was started, last dose being 08/07/12.  Dr. Karilyn Cota aware.

## 2012-08-09 NOTE — H&P (Signed)
Gregory Rangel is an 79 y.o. male.   Chief Complaint: Patient is here for EGD and ED. HPI: Patient is 79 year old Caucasian male who presents with history of intermittent solid food dysphagia. He's had it for a several months. Lately he said few episodes of impaction related regurgitation. He denies frequent heartburn. He has lost some weight recently but is felt to be due to the fact that he underwent right knee replacement 5 weeks ago. He is recuperating at [facility] center. He denies abdominal pain or melena. Patient was transitioned from warfarin to Lovenox but he did not take dose this morning.  Past Medical History  Diagnosis Date  . Hypertension   . Shortness of breath     with exertion  . Diabetes mellitus without complication   . GERD (gastroesophageal reflux disease)   . Arthritis     Past Surgical History  Procedure Laterality Date  . Back surgery      x 2  . Tonsillectomy    . Appendectomy    . Cholecystectomy    . Eye surgery      right eye with lens implant  . Right knee arthroscopy    . Total knee arthroplasty Right 07/06/2012    Procedure: RIGHT TOTAL KNEE ARTHROPLASTY;  Surgeon: Javier Docker, MD;  Location: [facility];  Service: Orthopedics;  Laterality: Right;    Family History  Problem Relation Age of Onset  . Family history unknown: Yes   Social History:  reports that he quit smoking about 29 years ago. He does not have any smokeless tobacco history on file. He reports that he does not drink alcohol or use illicit drugs.  Allergies:  Allergies  Allergen Reactions  . Penicillins Hives, Swelling and Rash    Medications Prior to Admission  Medication Sig Dispense Refill  . amLODipine (NORVASC) 10 MG tablet Take 10 mg by mouth every morning.      . benazepril (LOTENSIN) 40 MG tablet Take 40 mg by mouth daily with supper.      . doxycycline (DORYX) 100 MG EC tablet Take 100 mg by mouth 2 (two) times daily.      Marland Kitchen enoxaparin (LOVENOX) 100 MG/ML injection Inject  100 mg into the skin daily.      Marland Kitchen glipiZIDE (GLUCOTROL XL) 10 MG 24 hr tablet Take 10 mg by mouth every morning.      Marland Kitchen losartan (COZAAR) 100 MG tablet Take 100 mg by mouth every evening.      Marland Kitchen oxyCODONE-acetaminophen (PERCOCET) 5-325 MG per tablet Take 1 tablet by mouth every 4 (four) hours as needed for pain.  60 tablet  0  . warfarin (COUMADIN) 5 MG tablet Take 5 mg by mouth daily.        Results for orders placed during the hospital encounter of 08/09/12 (from the past 48 hour(s))  GLUCOSE, CAPILLARY     Status: Abnormal   Collection Time    08/09/12  8:21 AM      Result Value Range   Glucose-Capillary 105 (*) 70 - 99 mg/dL   No results found.  ROS  Blood pressure 136/74, pulse 82, temperature 98 F (36.7 C), temperature source Oral, height 6' (1.829 m), weight 235 lb (106.595 kg), SpO2 99.00%. Physical Exam  Constitutional: He appears well-developed and well-nourished.  HENT:  Mouth/Throat: Oropharynx is clear and moist.  Eyes: Conjunctivae are normal. No scleral icterus.  Neck: No thyromegaly present.  Cardiovascular: Normal rate, regular rhythm and normal heart sounds.  No murmur heard. Respiratory: Effort normal and breath sounds normal.  GI: Soft. He exhibits no distension and no mass. There is no tenderness.  Musculoskeletal: Edema: he has erythema skin of right leg and 1-2+ pitting edema.  Warm swollen right knee joint.  Lymphadenopathy:    He has no cervical adenopathy.  Neurological: He is alert.  Skin: Skin is warm.     Assessment/Plan Solid food dysphagia. EGD and ED.  REHMAN,NAJEEB U 08/09/2012, 9:57 AM

## 2012-08-10 ENCOUNTER — Encounter (HOSPITAL_COMMUNITY): Payer: Self-pay | Admitting: Internal Medicine

## 2012-08-10 LAB — GLUCOSE, CAPILLARY: Glucose-Capillary: 122 mg/dL — ABNORMAL HIGH (ref 70–99)

## 2012-08-11 LAB — GLUCOSE, CAPILLARY: Glucose-Capillary: 79 mg/dL (ref 70–99)

## 2012-08-12 DIAGNOSIS — L0291 Cutaneous abscess, unspecified: Secondary | ICD-10-CM | POA: Diagnosis not present

## 2012-08-12 DIAGNOSIS — L0390 Cellulitis, unspecified: Secondary | ICD-10-CM | POA: Diagnosis not present

## 2012-08-12 DIAGNOSIS — Z96659 Presence of unspecified artificial knee joint: Secondary | ICD-10-CM | POA: Diagnosis not present

## 2012-08-12 LAB — GLUCOSE, CAPILLARY
Glucose-Capillary: 72 mg/dL (ref 70–99)
Glucose-Capillary: 150 mg/dL — ABNORMAL HIGH (ref 70–99)

## 2012-08-13 NOTE — Progress Notes (Signed)
He is overall about the same. He had esophageal dilatation because of food impaction. He was taken off warfarin but is now back on. He has been recovering from a right knee replacement. He had a cellulitis of the leg and DVT. He is hopeful of going home soon.  He is awake and alert. He is able to ambulate with a rolling walker. His chest is clear. His knee looks okay. He has some cellulitic changes and edema of his right lower leg.  He has had a knee replacement. He has cellulitis which is being treated. He has DVT and is on medications. He has chronic renal failure and is not a good candidate for one of the newer medications  No change in treatments. I think he is getting close to being ready for discharge

## 2012-08-15 LAB — GLUCOSE, CAPILLARY: Glucose-Capillary: 168 mg/dL — ABNORMAL HIGH (ref 70–99)

## 2012-08-16 LAB — GLUCOSE, CAPILLARY
Glucose-Capillary: 94 mg/dL (ref 70–99)
Glucose-Capillary: 167 mg/dL — ABNORMAL HIGH (ref 70–99)

## 2012-08-17 LAB — GLUCOSE, CAPILLARY: Glucose-Capillary: 76 mg/dL (ref 70–99)

## 2012-08-20 ENCOUNTER — Ambulatory Visit (HOSPITAL_COMMUNITY): Payer: Medicare Other | Admitting: Physical Therapy

## 2012-08-20 DIAGNOSIS — Z7901 Long term (current) use of anticoagulants: Secondary | ICD-10-CM | POA: Diagnosis not present

## 2012-08-21 DIAGNOSIS — I10 Essential (primary) hypertension: Secondary | ICD-10-CM | POA: Diagnosis not present

## 2012-08-21 DIAGNOSIS — Z471 Aftercare following joint replacement surgery: Secondary | ICD-10-CM | POA: Diagnosis not present

## 2012-08-21 DIAGNOSIS — Z5181 Encounter for therapeutic drug level monitoring: Secondary | ICD-10-CM | POA: Diagnosis not present

## 2012-08-21 DIAGNOSIS — E119 Type 2 diabetes mellitus without complications: Secondary | ICD-10-CM | POA: Diagnosis not present

## 2012-08-21 DIAGNOSIS — K219 Gastro-esophageal reflux disease without esophagitis: Secondary | ICD-10-CM | POA: Diagnosis not present

## 2012-08-21 DIAGNOSIS — Z7901 Long term (current) use of anticoagulants: Secondary | ICD-10-CM | POA: Diagnosis not present

## 2012-08-22 DIAGNOSIS — Z471 Aftercare following joint replacement surgery: Secondary | ICD-10-CM | POA: Diagnosis not present

## 2012-08-22 DIAGNOSIS — Z7901 Long term (current) use of anticoagulants: Secondary | ICD-10-CM | POA: Diagnosis not present

## 2012-08-22 DIAGNOSIS — I10 Essential (primary) hypertension: Secondary | ICD-10-CM | POA: Diagnosis not present

## 2012-08-22 DIAGNOSIS — Z5181 Encounter for therapeutic drug level monitoring: Secondary | ICD-10-CM | POA: Diagnosis not present

## 2012-08-22 DIAGNOSIS — E119 Type 2 diabetes mellitus without complications: Secondary | ICD-10-CM | POA: Diagnosis not present

## 2012-08-22 DIAGNOSIS — K219 Gastro-esophageal reflux disease without esophagitis: Secondary | ICD-10-CM | POA: Diagnosis not present

## 2012-08-23 DIAGNOSIS — Z471 Aftercare following joint replacement surgery: Secondary | ICD-10-CM | POA: Diagnosis not present

## 2012-08-23 DIAGNOSIS — Z5181 Encounter for therapeutic drug level monitoring: Secondary | ICD-10-CM | POA: Diagnosis not present

## 2012-08-23 DIAGNOSIS — Z7901 Long term (current) use of anticoagulants: Secondary | ICD-10-CM | POA: Diagnosis not present

## 2012-08-23 DIAGNOSIS — I10 Essential (primary) hypertension: Secondary | ICD-10-CM | POA: Diagnosis not present

## 2012-08-23 DIAGNOSIS — K219 Gastro-esophageal reflux disease without esophagitis: Secondary | ICD-10-CM | POA: Diagnosis not present

## 2012-08-23 DIAGNOSIS — E119 Type 2 diabetes mellitus without complications: Secondary | ICD-10-CM | POA: Diagnosis not present

## 2012-08-27 DIAGNOSIS — I10 Essential (primary) hypertension: Secondary | ICD-10-CM | POA: Diagnosis not present

## 2012-08-27 DIAGNOSIS — E109 Type 1 diabetes mellitus without complications: Secondary | ICD-10-CM | POA: Diagnosis not present

## 2012-08-27 DIAGNOSIS — N19 Unspecified kidney failure: Secondary | ICD-10-CM | POA: Diagnosis not present

## 2012-08-27 DIAGNOSIS — M545 Low back pain: Secondary | ICD-10-CM | POA: Diagnosis not present

## 2012-08-28 DIAGNOSIS — Z471 Aftercare following joint replacement surgery: Secondary | ICD-10-CM | POA: Diagnosis not present

## 2012-08-28 DIAGNOSIS — Z5181 Encounter for therapeutic drug level monitoring: Secondary | ICD-10-CM | POA: Diagnosis not present

## 2012-08-28 DIAGNOSIS — Z7901 Long term (current) use of anticoagulants: Secondary | ICD-10-CM | POA: Diagnosis not present

## 2012-08-28 DIAGNOSIS — K219 Gastro-esophageal reflux disease without esophagitis: Secondary | ICD-10-CM | POA: Diagnosis not present

## 2012-08-28 DIAGNOSIS — E119 Type 2 diabetes mellitus without complications: Secondary | ICD-10-CM | POA: Diagnosis not present

## 2012-08-28 DIAGNOSIS — Z96659 Presence of unspecified artificial knee joint: Secondary | ICD-10-CM | POA: Diagnosis not present

## 2012-08-28 DIAGNOSIS — I10 Essential (primary) hypertension: Secondary | ICD-10-CM | POA: Diagnosis not present

## 2012-08-29 DIAGNOSIS — E119 Type 2 diabetes mellitus without complications: Secondary | ICD-10-CM | POA: Diagnosis not present

## 2012-08-29 DIAGNOSIS — Z5181 Encounter for therapeutic drug level monitoring: Secondary | ICD-10-CM | POA: Diagnosis not present

## 2012-08-29 DIAGNOSIS — I10 Essential (primary) hypertension: Secondary | ICD-10-CM | POA: Diagnosis not present

## 2012-08-29 DIAGNOSIS — K219 Gastro-esophageal reflux disease without esophagitis: Secondary | ICD-10-CM | POA: Diagnosis not present

## 2012-08-29 DIAGNOSIS — Z471 Aftercare following joint replacement surgery: Secondary | ICD-10-CM | POA: Diagnosis not present

## 2012-08-29 DIAGNOSIS — Z7901 Long term (current) use of anticoagulants: Secondary | ICD-10-CM | POA: Diagnosis not present

## 2012-08-30 DIAGNOSIS — Z5181 Encounter for therapeutic drug level monitoring: Secondary | ICD-10-CM | POA: Diagnosis not present

## 2012-08-30 DIAGNOSIS — Z471 Aftercare following joint replacement surgery: Secondary | ICD-10-CM | POA: Diagnosis not present

## 2012-08-30 DIAGNOSIS — K219 Gastro-esophageal reflux disease without esophagitis: Secondary | ICD-10-CM | POA: Diagnosis not present

## 2012-08-30 DIAGNOSIS — I10 Essential (primary) hypertension: Secondary | ICD-10-CM | POA: Diagnosis not present

## 2012-08-30 DIAGNOSIS — E119 Type 2 diabetes mellitus without complications: Secondary | ICD-10-CM | POA: Diagnosis not present

## 2012-08-30 DIAGNOSIS — Z7901 Long term (current) use of anticoagulants: Secondary | ICD-10-CM | POA: Diagnosis not present

## 2012-09-03 DIAGNOSIS — Z5181 Encounter for therapeutic drug level monitoring: Secondary | ICD-10-CM | POA: Diagnosis not present

## 2012-09-03 DIAGNOSIS — Z7901 Long term (current) use of anticoagulants: Secondary | ICD-10-CM | POA: Diagnosis not present

## 2012-09-03 DIAGNOSIS — E119 Type 2 diabetes mellitus without complications: Secondary | ICD-10-CM | POA: Diagnosis not present

## 2012-09-03 DIAGNOSIS — K219 Gastro-esophageal reflux disease without esophagitis: Secondary | ICD-10-CM | POA: Diagnosis not present

## 2012-09-03 DIAGNOSIS — Z471 Aftercare following joint replacement surgery: Secondary | ICD-10-CM | POA: Diagnosis not present

## 2012-09-03 DIAGNOSIS — I10 Essential (primary) hypertension: Secondary | ICD-10-CM | POA: Diagnosis not present

## 2012-09-04 DIAGNOSIS — Z7901 Long term (current) use of anticoagulants: Secondary | ICD-10-CM | POA: Diagnosis not present

## 2012-09-04 DIAGNOSIS — Z471 Aftercare following joint replacement surgery: Secondary | ICD-10-CM | POA: Diagnosis not present

## 2012-09-04 DIAGNOSIS — E119 Type 2 diabetes mellitus without complications: Secondary | ICD-10-CM | POA: Diagnosis not present

## 2012-09-04 DIAGNOSIS — Z5181 Encounter for therapeutic drug level monitoring: Secondary | ICD-10-CM | POA: Diagnosis not present

## 2012-09-04 DIAGNOSIS — K219 Gastro-esophageal reflux disease without esophagitis: Secondary | ICD-10-CM | POA: Diagnosis not present

## 2012-09-04 DIAGNOSIS — I10 Essential (primary) hypertension: Secondary | ICD-10-CM | POA: Diagnosis not present

## 2012-09-06 DIAGNOSIS — L0390 Cellulitis, unspecified: Secondary | ICD-10-CM | POA: Diagnosis not present

## 2012-09-06 DIAGNOSIS — L851 Acquired keratosis [keratoderma] palmaris et plantaris: Secondary | ICD-10-CM | POA: Diagnosis not present

## 2012-09-06 DIAGNOSIS — I82409 Acute embolism and thrombosis of unspecified deep veins of unspecified lower extremity: Secondary | ICD-10-CM | POA: Diagnosis not present

## 2012-09-06 DIAGNOSIS — I10 Essential (primary) hypertension: Secondary | ICD-10-CM | POA: Diagnosis not present

## 2012-09-06 DIAGNOSIS — E109 Type 1 diabetes mellitus without complications: Secondary | ICD-10-CM | POA: Diagnosis not present

## 2012-09-06 DIAGNOSIS — K219 Gastro-esophageal reflux disease without esophagitis: Secondary | ICD-10-CM | POA: Diagnosis not present

## 2012-09-06 DIAGNOSIS — Z471 Aftercare following joint replacement surgery: Secondary | ICD-10-CM | POA: Diagnosis not present

## 2012-09-06 DIAGNOSIS — E1159 Type 2 diabetes mellitus with other circulatory complications: Secondary | ICD-10-CM | POA: Diagnosis not present

## 2012-09-06 DIAGNOSIS — E119 Type 2 diabetes mellitus without complications: Secondary | ICD-10-CM | POA: Diagnosis not present

## 2012-09-06 DIAGNOSIS — L0291 Cutaneous abscess, unspecified: Secondary | ICD-10-CM | POA: Diagnosis not present

## 2012-09-06 DIAGNOSIS — Z5181 Encounter for therapeutic drug level monitoring: Secondary | ICD-10-CM | POA: Diagnosis not present

## 2012-09-06 DIAGNOSIS — Z7901 Long term (current) use of anticoagulants: Secondary | ICD-10-CM | POA: Diagnosis not present

## 2012-09-06 DIAGNOSIS — L609 Nail disorder, unspecified: Secondary | ICD-10-CM | POA: Diagnosis not present

## 2012-09-07 DIAGNOSIS — I10 Essential (primary) hypertension: Secondary | ICD-10-CM | POA: Diagnosis not present

## 2012-09-07 DIAGNOSIS — E119 Type 2 diabetes mellitus without complications: Secondary | ICD-10-CM | POA: Diagnosis not present

## 2012-09-07 DIAGNOSIS — Z7901 Long term (current) use of anticoagulants: Secondary | ICD-10-CM | POA: Diagnosis not present

## 2012-09-07 DIAGNOSIS — K219 Gastro-esophageal reflux disease without esophagitis: Secondary | ICD-10-CM | POA: Diagnosis not present

## 2012-09-07 DIAGNOSIS — Z471 Aftercare following joint replacement surgery: Secondary | ICD-10-CM | POA: Diagnosis not present

## 2012-09-07 DIAGNOSIS — Z5181 Encounter for therapeutic drug level monitoring: Secondary | ICD-10-CM | POA: Diagnosis not present

## 2012-09-10 DIAGNOSIS — E119 Type 2 diabetes mellitus without complications: Secondary | ICD-10-CM | POA: Diagnosis not present

## 2012-09-10 DIAGNOSIS — I10 Essential (primary) hypertension: Secondary | ICD-10-CM | POA: Diagnosis not present

## 2012-09-10 DIAGNOSIS — Z7901 Long term (current) use of anticoagulants: Secondary | ICD-10-CM | POA: Diagnosis not present

## 2012-09-10 DIAGNOSIS — Z471 Aftercare following joint replacement surgery: Secondary | ICD-10-CM | POA: Diagnosis not present

## 2012-09-10 DIAGNOSIS — K219 Gastro-esophageal reflux disease without esophagitis: Secondary | ICD-10-CM | POA: Diagnosis not present

## 2012-09-10 DIAGNOSIS — Z5181 Encounter for therapeutic drug level monitoring: Secondary | ICD-10-CM | POA: Diagnosis not present

## 2012-09-13 DIAGNOSIS — Z471 Aftercare following joint replacement surgery: Secondary | ICD-10-CM | POA: Diagnosis not present

## 2012-09-13 DIAGNOSIS — I10 Essential (primary) hypertension: Secondary | ICD-10-CM | POA: Diagnosis not present

## 2012-09-13 DIAGNOSIS — E119 Type 2 diabetes mellitus without complications: Secondary | ICD-10-CM | POA: Diagnosis not present

## 2012-09-13 DIAGNOSIS — Z5181 Encounter for therapeutic drug level monitoring: Secondary | ICD-10-CM | POA: Diagnosis not present

## 2012-09-13 DIAGNOSIS — K219 Gastro-esophageal reflux disease without esophagitis: Secondary | ICD-10-CM | POA: Diagnosis not present

## 2012-09-13 DIAGNOSIS — Z7901 Long term (current) use of anticoagulants: Secondary | ICD-10-CM | POA: Diagnosis not present

## 2012-09-17 DIAGNOSIS — I872 Venous insufficiency (chronic) (peripheral): Secondary | ICD-10-CM | POA: Diagnosis not present

## 2012-09-17 DIAGNOSIS — Z7901 Long term (current) use of anticoagulants: Secondary | ICD-10-CM | POA: Diagnosis not present

## 2012-09-17 DIAGNOSIS — Z5181 Encounter for therapeutic drug level monitoring: Secondary | ICD-10-CM | POA: Diagnosis not present

## 2012-09-17 DIAGNOSIS — I82409 Acute embolism and thrombosis of unspecified deep veins of unspecified lower extremity: Secondary | ICD-10-CM | POA: Diagnosis not present

## 2012-09-17 DIAGNOSIS — Z471 Aftercare following joint replacement surgery: Secondary | ICD-10-CM | POA: Diagnosis not present

## 2012-09-17 DIAGNOSIS — E119 Type 2 diabetes mellitus without complications: Secondary | ICD-10-CM | POA: Diagnosis not present

## 2012-09-17 DIAGNOSIS — I10 Essential (primary) hypertension: Secondary | ICD-10-CM | POA: Diagnosis not present

## 2012-09-17 DIAGNOSIS — K219 Gastro-esophageal reflux disease without esophagitis: Secondary | ICD-10-CM | POA: Diagnosis not present

## 2012-09-18 DIAGNOSIS — D0420 Carcinoma in situ of skin of unspecified ear and external auricular canal: Secondary | ICD-10-CM | POA: Diagnosis not present

## 2012-09-18 DIAGNOSIS — I872 Venous insufficiency (chronic) (peripheral): Secondary | ICD-10-CM | POA: Diagnosis not present

## 2012-09-18 DIAGNOSIS — D485 Neoplasm of uncertain behavior of skin: Secondary | ICD-10-CM | POA: Diagnosis not present

## 2012-09-18 DIAGNOSIS — Z85828 Personal history of other malignant neoplasm of skin: Secondary | ICD-10-CM | POA: Diagnosis not present

## 2012-09-18 DIAGNOSIS — L570 Actinic keratosis: Secondary | ICD-10-CM | POA: Diagnosis not present

## 2012-09-20 ENCOUNTER — Other Ambulatory Visit: Payer: Self-pay | Admitting: *Deleted

## 2012-09-20 DIAGNOSIS — M7989 Other specified soft tissue disorders: Secondary | ICD-10-CM

## 2012-09-20 DIAGNOSIS — I872 Venous insufficiency (chronic) (peripheral): Secondary | ICD-10-CM

## 2012-09-26 DIAGNOSIS — C44221 Squamous cell carcinoma of skin of unspecified ear and external auricular canal: Secondary | ICD-10-CM | POA: Diagnosis not present

## 2012-09-27 DIAGNOSIS — I10 Essential (primary) hypertension: Secondary | ICD-10-CM | POA: Diagnosis not present

## 2012-09-27 DIAGNOSIS — E119 Type 2 diabetes mellitus without complications: Secondary | ICD-10-CM | POA: Diagnosis not present

## 2012-09-27 DIAGNOSIS — K219 Gastro-esophageal reflux disease without esophagitis: Secondary | ICD-10-CM | POA: Diagnosis not present

## 2012-09-27 DIAGNOSIS — Z7901 Long term (current) use of anticoagulants: Secondary | ICD-10-CM | POA: Diagnosis not present

## 2012-09-27 DIAGNOSIS — Z5181 Encounter for therapeutic drug level monitoring: Secondary | ICD-10-CM | POA: Diagnosis not present

## 2012-09-27 DIAGNOSIS — Z471 Aftercare following joint replacement surgery: Secondary | ICD-10-CM | POA: Diagnosis not present

## 2012-10-02 DIAGNOSIS — Z96659 Presence of unspecified artificial knee joint: Secondary | ICD-10-CM | POA: Diagnosis not present

## 2012-10-08 DIAGNOSIS — Z5181 Encounter for therapeutic drug level monitoring: Secondary | ICD-10-CM | POA: Diagnosis not present

## 2012-10-08 DIAGNOSIS — Z471 Aftercare following joint replacement surgery: Secondary | ICD-10-CM | POA: Diagnosis not present

## 2012-10-08 DIAGNOSIS — I10 Essential (primary) hypertension: Secondary | ICD-10-CM | POA: Diagnosis not present

## 2012-10-08 DIAGNOSIS — M1990 Unspecified osteoarthritis, unspecified site: Secondary | ICD-10-CM | POA: Diagnosis not present

## 2012-10-08 DIAGNOSIS — E119 Type 2 diabetes mellitus without complications: Secondary | ICD-10-CM | POA: Diagnosis not present

## 2012-10-08 DIAGNOSIS — Z7901 Long term (current) use of anticoagulants: Secondary | ICD-10-CM | POA: Diagnosis not present

## 2012-10-08 DIAGNOSIS — K219 Gastro-esophageal reflux disease without esophagitis: Secondary | ICD-10-CM | POA: Diagnosis not present

## 2012-10-08 DIAGNOSIS — N19 Unspecified kidney failure: Secondary | ICD-10-CM | POA: Diagnosis not present

## 2012-10-08 DIAGNOSIS — I82409 Acute embolism and thrombosis of unspecified deep veins of unspecified lower extremity: Secondary | ICD-10-CM | POA: Diagnosis not present

## 2012-10-09 ENCOUNTER — Ambulatory Visit (HOSPITAL_COMMUNITY)
Admission: RE | Admit: 2012-10-09 | Discharge: 2012-10-09 | Disposition: A | Discharge status: Home / Self Care | Payer: Medicare Other | Source: Ambulatory Visit | Attending: Specialist | Admitting: Specialist

## 2012-10-09 DIAGNOSIS — M25669 Stiffness of unspecified knee, not elsewhere classified: Secondary | ICD-10-CM | POA: Diagnosis not present

## 2012-10-09 DIAGNOSIS — IMO0001 Reserved for inherently not codable concepts without codable children: Secondary | ICD-10-CM | POA: Diagnosis not present

## 2012-10-09 DIAGNOSIS — M25569 Pain in unspecified knee: Secondary | ICD-10-CM | POA: Diagnosis not present

## 2012-10-09 DIAGNOSIS — R269 Unspecified abnormalities of gait and mobility: Secondary | ICD-10-CM | POA: Insufficient documentation

## 2012-10-09 DIAGNOSIS — E119 Type 2 diabetes mellitus without complications: Secondary | ICD-10-CM | POA: Diagnosis not present

## 2012-10-09 DIAGNOSIS — R262 Difficulty in walking, not elsewhere classified: Secondary | ICD-10-CM | POA: Insufficient documentation

## 2012-10-09 DIAGNOSIS — I10 Essential (primary) hypertension: Secondary | ICD-10-CM | POA: Diagnosis not present

## 2012-10-09 NOTE — Evaluation (Signed)
Physical Therapy Evaluation  Patient Details  Name: Gregory Rangel MRN: [id number] Date of Birth: 07-Dec-1933  Today's Date: 10/09/2012 Time: 1350-1430 PT Time Calculation (min): 40 min Charge:  evaluation             Visit#: 1 of 8  Re-eval: 11/08/12 Assessment Diagnosis: Rt TKR Surgical Date: 07/06/12 Next MD Visit: 11/26/2012 Prior Therapy: snf/HH  Authorization: medicare    Authorization Time Period:    Authorization Visit#: 1 of 8   Past Medical History:  Past Medical History  Diagnosis Date  . Hypertension   . Shortness of breath     with exertion  . Diabetes mellitus without complication   . GERD (gastroesophageal reflux disease)   . Arthritis    Past Surgical History:  Past Surgical History  Procedure Laterality Date  . Back surgery      x 2  . Tonsillectomy    . Appendectomy    . Cholecystectomy    . Eye surgery      right eye with lens implant  . Right knee arthroscopy    . Total knee arthroplasty Right 07/06/2012    Procedure: RIGHT TOTAL KNEE ARTHROPLASTY;  Surgeon: Javier Docker, MD;  Location: [facility];  Service: Orthopedics;  Laterality: Right;  . Esophagogastroduodenoscopy (egd) with esophageal dilation N/A 08/09/2012    Procedure: ESOPHAGOGASTRODUODENOSCOPY (EGD) WITH ESOPHAGEAL DILATION;  Surgeon: Malissa Hippo, MD;  Location: [facility];  Service: Endoscopy;  Laterality: N/A;  155-moved to 830 Ann to notify pt    Subjective Symptoms/Limitations Symptoms: Pt states that he had a Rt TKR on April 25th he was discharged to SNF on 07/10/2012.  He returned home early May with HH. He has recieved home health until two weeks ago.  When he went to the surgeon last week the surgeon felt as if Gregory Rangel needed some more therapy.  He states he does not feel that the knee has gotten well because it hurts most of the time.  He is walking with a cane now and he was not prior to his surgery.  He ishaving difficulty getting in and out of his car.  He has been  referred to PT to return him to his privious functional level.  How long can you sit comfortably?: an hour to an hour and a half How long can you stand comfortably?: 15 minutes  How long can you walk comfortably?: with the cane for 30 minutes  Pain Assessment Currently in Pain?: No/denies (pain has gotten up to a 7/10) Pain Location: Knee Pain Orientation: Right Pain Type: Surgical pain Pain Onset: More than a month ago Pain Frequency: Intermittent Pain Relieving Factors: pain meds  Effect of Pain on Daily Activities: increases Multiple Pain Sites: No  Precautions/Restrictions     Balance Screening Balance Screen Has the patient fallen in the past 6 months: No  Prior Functioning  Prior Function Vocation: Retired Leisure: Hobbies-yes (Comment) Comments: farming, yard work  Cognition/Observation Cognition Overall Cognitive Status: Within Functional Limits for tasks assessed Observation/Other Assessments Observations: + swelling   Sensation/Coordination/Flexibility/Functional Tests Functional Tests Functional Tests: LEFS  48/80  Assessment RLE AROM (degrees) Right Knee Extension: 17 Right Knee Flexion: 112 RLE Strength Right Hip Flexion: 5/5 Right Hip Extension: 3-/5 Right Hip ABduction: 5/5 Right Knee Flexion: 4/5 Right Knee Extension: 4/5 Right Ankle Dorsiflexion: 3+/5  Exercise/Treatments Mobility/Balance  Static Standing Balance Single Leg Stance - Right Leg: 3 Single Leg Stance - Left Leg: 12   Stretches Active Hamstring  Stretch: 3 reps;30 seconds   Standing SLS: 2x Seated Long Arc Quad: 10 reps Supine Quad Sets: 10 reps Heel Slides: 5 reps   Prone  Hip Extension: 5 reps    Physical Therapy Assessment and Plan PT Assessment and Plan Clinical Impression Statement: Pt s/p TKR in April who has had extensive therapy but still has significant lacking extension, decreased hip extension strength and decreased balance.  Pt will benefit from skilled  PT to work on the former issues and return pt to his maximal functional ability Pt will benefit from skilled therapeutic intervention in order to improve on the following deficits: Abnormal gait;Decreased balance;Difficulty walking;Decreased strength;Decreased range of motion;Pain;Increased fascial restricitons Rehab Potential: Good PT Frequency: Min 2X/week PT Duration: 4 weeks PT Treatment/Interventions: Modalities;Manual techniques;Therapeutic exercise PT Plan: begin working extension, balance and strengtening of hip extensors.  Rockerboard, terminal extension, manual while prone knee hang, hip ext, retro walking, SLS, vector,ect.    Goals Home Exercise Program Pt/caregiver will Perform Home Exercise Program: For increased strengthening PT Short Term Goals PT Short Term Goal 1: ROM 9-115 to allow more normalized gt PT Short Term Goal 2: Pt pain to be no greater than a 4/10 PT Short Term Goal 3: Pt to be able to walk inside without his cane PT Short Term Goal 4: Pt to be able to get in and out of his car without difficullty PT Long Term Goals Time to Complete Long Term Goals: 4 weeks PT Long Term Goal 1: Pt to ROM to be 4-120 to allow pt to squat down with confidence and normalized gt PT Long Term Goal 2: Pt to be able to walk inside and outside without his cane Long Term Goal 3: Pt LEFS to be improved by 10 pt Long Term Goal 4: Pt pain to be no greater than a 2/10 80% of the day PT Long Term Goal 5: Pt to be able to go up and down 5 steps reciprocally   Problem List Patient Active Problem List   Diagnosis Date Noted  . Postoperative stiffness of total knee replacement 10/09/2012  . Dysphagia, unspecified(787.20) 07/31/2012  . Right knee DJD 07/06/2012  . Lumbago 05/09/2011  . Lumbar spondylosis 05/09/2011    PT - End of Session Activity Tolerance: Patient tolerated treatment well PT Plan of Care PT Home Exercise Plan: given  GP Functional Assessment Tool Used:  LEFS Functional Limitation: Self care Self Care Current Status (R6045): At least 40 percent but less than 60 percent impaired, limited or restricted Self Care Goal Status (W0981): At least 1 percent but less than 20 percent impaired, limited or restricted  Gregory Rangel,CINDY 10/09/2012, 5:07 PM  Physician Documentation Your signature is required to indicate approval of the treatment plan as stated above.  Please sign and either send electronically or make a copy of this report for your files and return this physician signed original.   Please mark one 1.__approve of plan  2. ___approve of plan with the following conditions.   ______________________________                                                          _____________________ Physician Signature  Date  

## 2012-10-16 ENCOUNTER — Ambulatory Visit (HOSPITAL_COMMUNITY)
Admission: RE | Admit: 2012-10-16 | Discharge: 2012-10-16 | Disposition: A | Discharge status: Home / Self Care | Payer: Medicare Other | Source: Ambulatory Visit | Attending: Specialist | Admitting: Specialist

## 2012-10-16 DIAGNOSIS — IMO0001 Reserved for inherently not codable concepts without codable children: Secondary | ICD-10-CM | POA: Diagnosis not present

## 2012-10-16 DIAGNOSIS — R1310 Dysphagia, unspecified: Secondary | ICD-10-CM | POA: Diagnosis not present

## 2012-10-16 NOTE — Progress Notes (Signed)
Physical Therapy Treatment Patient Details  Name: Gregory Rangel MRN: [id number] Date of Birth: 07-31-1933  Today's Date: 10/16/2012 Time: 4098-1191 PT Time Calculation (min): 42 min  Visit#: 2 of 8  Re-eval: 11/08/12 Charges: Therex x 47'(8295-6213) Manual x 15'(1458-1513)  Authorization: medicare  Authorization Visit#: 2 of 8   Subjective: Symptoms/Limitations Symptoms: Pt states that he is completing HEP but not completing his exercises every day.  Pain Assessment Currently in Pain?: Yes Pain Score: 6  Pain Location: Knee Pain Orientation: Right   Exercise/Treatments Aerobic Stationary Bike: 5'@1 .0 seat 11 to improve ROM Standing Heel Raises: 10 reps;Limitations Heel Raises Limitations: Toe raises x 10 Knee Flexion: 10 reps Functional Squat: 10 reps Supine Quad Sets: 10 reps Short Arc Quad Sets: 10 reps Knee Extension: PROM Knee Flexion: PROM   Manual Therapy Manual Therapy: Myofascial release Joint Mobilization: Grade II-III a/p mobs to right knee to increase motion (2') Myofascial Release: To anterior and posterior right knee to decrease tightness and adhesions and improve motion (3' with prone knee hang to hamstrings; 10' ot entire knee in supine)  Physical Therapy Assessment and Plan PT Assessment and Plan Clinical Impression Statement: Pt appears to be most limited by tightness and pain. Manual techniques completed to right knee to improve knee motion. Pt displays good distal quad contraction with quad sets. Pt reports decreased pain after completing rec bike. Pt reports 5/10 pain at end of session. PT Plan: Continue to progress ROM, strength and balance per PT POC. Begin rocker board, TKE, hip ext, retro walking and SLS next session.     Problem List Patient Active Problem List   Diagnosis Date Noted  . Postoperative stiffness of total knee replacement 10/09/2012  . Dysphagia, unspecified(787.20) 07/31/2012  . Right knee DJD 07/06/2012  . Lumbago  05/09/2011  . Lumbar spondylosis 05/09/2011    PT - End of Session Activity Tolerance: Patient tolerated treatment well General Behavior During Therapy: [facility] for tasks assessed/performed  GP Functional Assessment Tool Used: LEFS  Seth Bake, PTA  10/16/2012, 5:07 PM

## 2012-10-17 DIAGNOSIS — K219 Gastro-esophageal reflux disease without esophagitis: Secondary | ICD-10-CM | POA: Diagnosis not present

## 2012-10-17 DIAGNOSIS — Z471 Aftercare following joint replacement surgery: Secondary | ICD-10-CM | POA: Diagnosis not present

## 2012-10-17 DIAGNOSIS — Z7901 Long term (current) use of anticoagulants: Secondary | ICD-10-CM | POA: Diagnosis not present

## 2012-10-17 DIAGNOSIS — I10 Essential (primary) hypertension: Secondary | ICD-10-CM | POA: Diagnosis not present

## 2012-10-17 DIAGNOSIS — E119 Type 2 diabetes mellitus without complications: Secondary | ICD-10-CM | POA: Diagnosis not present

## 2012-10-17 DIAGNOSIS — Z5181 Encounter for therapeutic drug level monitoring: Secondary | ICD-10-CM | POA: Diagnosis not present

## 2012-10-18 ENCOUNTER — Emergency Department (HOSPITAL_COMMUNITY)
Admission: EM | Admit: 2012-10-18 | Discharge: 2012-10-18 | Disposition: A | Discharge status: Home / Self Care | Payer: Medicare Other | Attending: Emergency Medicine | Admitting: Emergency Medicine

## 2012-10-18 ENCOUNTER — Encounter (HOSPITAL_COMMUNITY): Payer: Self-pay | Admitting: *Deleted

## 2012-10-18 ENCOUNTER — Ambulatory Visit (HOSPITAL_COMMUNITY)
Admission: RE | Admit: 2012-10-18 | Discharge: 2012-10-18 | Disposition: A | Discharge status: Home / Self Care | Payer: Medicare Other | Source: Ambulatory Visit | Attending: Specialist | Admitting: Specialist

## 2012-10-18 DIAGNOSIS — I10 Essential (primary) hypertension: Secondary | ICD-10-CM | POA: Diagnosis not present

## 2012-10-18 DIAGNOSIS — Z9889 Other specified postprocedural states: Secondary | ICD-10-CM | POA: Insufficient documentation

## 2012-10-18 DIAGNOSIS — M79609 Pain in unspecified limb: Secondary | ICD-10-CM | POA: Insufficient documentation

## 2012-10-18 DIAGNOSIS — L03119 Cellulitis of unspecified part of limb: Secondary | ICD-10-CM | POA: Insufficient documentation

## 2012-10-18 DIAGNOSIS — M2559 Pain in other specified joint: Secondary | ICD-10-CM | POA: Diagnosis not present

## 2012-10-18 DIAGNOSIS — E119 Type 2 diabetes mellitus without complications: Secondary | ICD-10-CM | POA: Insufficient documentation

## 2012-10-18 DIAGNOSIS — Z7901 Long term (current) use of anticoagulants: Secondary | ICD-10-CM | POA: Insufficient documentation

## 2012-10-18 DIAGNOSIS — R238 Other skin changes: Secondary | ICD-10-CM | POA: Diagnosis not present

## 2012-10-18 DIAGNOSIS — Z8709 Personal history of other diseases of the respiratory system: Secondary | ICD-10-CM | POA: Diagnosis not present

## 2012-10-18 DIAGNOSIS — Z880 Allergy status to penicillin: Secondary | ICD-10-CM | POA: Insufficient documentation

## 2012-10-18 DIAGNOSIS — L0390 Cellulitis, unspecified: Secondary | ICD-10-CM

## 2012-10-18 DIAGNOSIS — G8929 Other chronic pain: Secondary | ICD-10-CM | POA: Diagnosis not present

## 2012-10-18 DIAGNOSIS — M129 Arthropathy, unspecified: Secondary | ICD-10-CM | POA: Diagnosis not present

## 2012-10-18 DIAGNOSIS — Z8589 Personal history of malignant neoplasm of other organs and systems: Secondary | ICD-10-CM | POA: Diagnosis not present

## 2012-10-18 DIAGNOSIS — K219 Gastro-esophageal reflux disease without esophagitis: Secondary | ICD-10-CM | POA: Insufficient documentation

## 2012-10-18 DIAGNOSIS — Z87891 Personal history of nicotine dependence: Secondary | ICD-10-CM | POA: Diagnosis not present

## 2012-10-18 DIAGNOSIS — Z79899 Other long term (current) drug therapy: Secondary | ICD-10-CM | POA: Diagnosis not present

## 2012-10-18 DIAGNOSIS — L02419 Cutaneous abscess of limb, unspecified: Secondary | ICD-10-CM | POA: Diagnosis not present

## 2012-10-18 HISTORY — DX: Malignant (primary) neoplasm, unspecified: C80.1

## 2012-10-18 LAB — PROTIME-INR
INR: 4.06 — ABNORMAL HIGH (ref 0.00–1.49)
Prothrombin Time: 37.9 s — ABNORMAL HIGH (ref 11.6–15.2)

## 2012-10-18 LAB — CBC WITH DIFFERENTIAL/PLATELET
Basophils Absolute: 0.1 K/uL (ref 0.0–0.1)
Basophils Relative: 1 % (ref 0–1)
Eosinophils Absolute: 0.6 K/uL (ref 0.0–0.7)
Eosinophils Relative: 6 % — ABNORMAL HIGH (ref 0–5)
HCT: 35 % — ABNORMAL LOW (ref 39.0–52.0)
Hemoglobin: 11 g/dL — ABNORMAL LOW (ref 13.0–17.0)
Lymphocytes Relative: 20 % (ref 12–46)
Lymphs Abs: 1.9 K/uL (ref 0.7–4.0)
MCH: 27.5 pg (ref 26.0–34.0)
MCHC: 31.4 g/dL (ref 30.0–36.0)
MCV: 87.5 fL (ref 78.0–100.0)
Monocytes Absolute: 0.5 K/uL (ref 0.1–1.0)
Monocytes Relative: 5 % (ref 3–12)
Neutro Abs: 6.4 K/uL (ref 1.7–7.7)
Neutrophils Relative %: 68 % (ref 43–77)
Platelets: 229 K/uL (ref 150–400)
RBC: 4 MIL/uL — ABNORMAL LOW (ref 4.22–5.81)
RDW: 15.7 % — ABNORMAL HIGH (ref 11.5–15.5)
WBC: 9.3 K/uL (ref 4.0–10.5)

## 2012-10-18 LAB — BASIC METABOLIC PANEL WITH GFR
BUN: 39 mg/dL — ABNORMAL HIGH (ref 6–23)
CO2: 24 meq/L (ref 19–32)
Calcium: 9.4 mg/dL (ref 8.4–10.5)
Chloride: 111 meq/L (ref 96–112)
Creatinine, Ser: 3.21 mg/dL — ABNORMAL HIGH (ref 0.50–1.35)
GFR calc Af Amer: 20 mL/min — ABNORMAL LOW
GFR calc non Af Amer: 17 mL/min — ABNORMAL LOW
Glucose, Bld: 101 mg/dL — ABNORMAL HIGH (ref 70–99)
Potassium: 4.8 meq/L (ref 3.5–5.1)
Sodium: 144 meq/L (ref 135–145)

## 2012-10-18 MED ORDER — CLINDAMYCIN HCL 150 MG PO CAPS
450.0000 mg | ORAL_CAPSULE | Freq: Once | ORAL | Status: AC
  Administered 2012-10-18: 450 mg via ORAL
  Filled 2012-10-18: qty 3

## 2012-10-18 MED ORDER — CLINDAMYCIN HCL 150 MG PO CAPS: 450.0000 mg | ORAL_CAPSULE | Freq: Three times a day (TID) | ORAL | Status: AC

## 2012-10-18 NOTE — Progress Notes (Signed)
Physical Therapy Treatment Patient Details  Name: Gregory Rangel MRN: [id number] Date of Birth: [birth year]  Today's Date: 10/18/2012 Time: 1300-1320 PT Time Calculation (min): 20 min No charge  Visit#: 3 of 8  Re-eval: 11/08/12  Authorization: medicare  Authorization Time Period:    Authorization Visit#: 3 of 8   Subjective: Symptoms/Limitations Symptoms: Pt states that he is in a lot of pain today. He almost cancelled his appt. Pain Assessment Currently in Pain?: Yes Pain Score: 9  Pain Location: Knee Pain Orientation: Right   Exercise/Treatments Aerobic Stationary Bike: 8' seat 12 to improve ROM  Physical Therapy Assessment and Plan PT Assessment and Plan Clinical Impression Statement: After completing bike pt was taken back to mat. Once pant leg was raised redness, heat, open areas and blistering was noted on right distal leg. Dr.Hawkins was notified and recommended pt go to ED. Tx was cancelled for today and pt was escorted to ED. PT Plan: Continue per PT POC pending possible cellulitis of right leg.      Problem List Patient Active Problem List   Diagnosis Date Noted  . Postoperative stiffness of total knee replacement 10/09/2012  . Dysphagia, unspecified(787.20) 07/31/2012  . Right knee DJD 07/06/2012  . Lumbago 05/09/2011  . Lumbar spondylosis 05/09/2011    PT - End of Session Activity Tolerance: Patient tolerated treatment well General Behavior During Therapy: [facility] for tasks assessed/performed  Seth Bake, PTA 10/18/2012, 1:53 PM

## 2012-10-18 NOTE — ED Notes (Signed)
Pt here for PT after rt  Knee replacement. In April and advised to come to ER for eval of swollen and red rt lower leg.

## 2012-10-18 NOTE — ED Provider Notes (Signed)
CSN: [id number]     Arrival date & time 10/18/12  1340 History  This chart was scribed for Junius Argyle, MD by Bennett Scrape, ED Scribe. This patient was seen in room [facility] and the patient's care was started at 2:56 PM.   Chief Complaint  Patient presents with  . Cellulitis    Patient is a 79 y.o. male presenting with extremity pain. The history is provided by the patient. No language interpreter was used.  Extremity Pain This is a new problem. The current episode started more than 1 week ago. The problem occurs constantly. The problem has been gradually worsening. Pertinent negatives include no chest pain, no abdominal pain, no headaches and no shortness of breath. The symptoms are aggravated by walking. The symptoms are relieved by rest. He has tried nothing for the symptoms.    HPI Comments: Gregory Rangel is a 79 y.o. male who presents to the Emergency Department complaining of 1 month for gradual onset, constant redness to the RLE that has gradually worsened over the past 2 days. Pt states that the symptoms are mostly located along the right shin. He states that the swelling and pain to the right knee has been chronic since the surgery and he denies any new changes. He is currently undergoing PT for right knee replacement therapy. The right knee replacement was performed by Dr. Jillyn Hidden in April 2014. Last PT appointment was 2 days ago. He states that he has followed up with Dr. Jillyn Hidden recently as well but denies that the symptoms were this severe at the time. He denies any recent falls or injuries. He denies any recent antibiotic use for the same. He rates his pain a 7 or 8 out of 10 during ambulation and a 1 or 2 at rest. He denies SOB, CP, fevers, nausea and diarrhea as associated symptoms. He denies having any prior orthopedic surgeries prior to the right knee replacement. He is currently on anticoagulants for a known DVT in the RLE.  PCP is Dr. Juanetta Gosling  Past Medical History   Diagnosis Date  . Hypertension   . Shortness of breath     with exertion  . Diabetes mellitus without complication   . GERD (gastroesophageal reflux disease)   . Arthritis   . Cancer     colon cancer   Past Surgical History  Procedure Laterality Date  . Back surgery      x 2  . Tonsillectomy    . Appendectomy    . Cholecystectomy    . Eye surgery      right eye with lens implant  . Right knee arthroscopy    . Total knee arthroplasty Right 07/06/2012    Procedure: RIGHT TOTAL KNEE ARTHROPLASTY;  Surgeon: Javier Docker, MD;  Location: [facility];  Service: Orthopedics;  Laterality: Right;  . Esophagogastroduodenoscopy (egd) with esophageal dilation N/A 08/09/2012    Procedure: ESOPHAGOGASTRODUODENOSCOPY (EGD) WITH ESOPHAGEAL DILATION;  Surgeon: Malissa Hippo, MD;  Location: [facility];  Service: Endoscopy;  Laterality: N/A;  155-moved to 830 Ann to notify pt  . Colon surgery     History reviewed. No pertinent family history. History  Substance Use Topics  . Smoking status: Former Smoker    Quit date: 06/28/1983  . Smokeless tobacco: Not on file  . Alcohol Use: No    Review of Systems  Constitutional: Negative for fever and fatigue.  HENT: Negative for congestion, drooling and neck pain.   Eyes: Negative for pain.  Respiratory: Negative for cough and shortness of breath.   Cardiovascular: Negative for chest pain.  Gastrointestinal: Negative for nausea, vomiting, abdominal pain and diarrhea.  Genitourinary: Negative for dysuria and hematuria.  Musculoskeletal: Positive for joint swelling and arthralgias.  Skin: Positive for color change.  Neurological: Negative for dizziness and headaches.  Hematological: Negative for adenopathy.  Psychiatric/Behavioral: Negative for behavioral problems.  All other systems reviewed and are negative.    Allergies  Penicillins  Home Medications   Current Outpatient Rx  Name  Route  Sig  Dispense  Refill  . amLODipine (NORVASC)  10 MG tablet   Oral   Take 10 mg by mouth every morning.         . benazepril (LOTENSIN) 40 MG tablet   Oral   Take 40 mg by mouth daily with supper.         . doxycycline (DORYX) 100 MG EC tablet   Oral   Take 100 mg by mouth 2 (two) times daily.         Marland Kitchen enoxaparin (LOVENOX) 100 MG/ML injection   Subcutaneous   Inject 100 mg into the skin daily.         Marland Kitchen glipiZIDE (GLUCOTROL XL) 10 MG 24 hr tablet   Oral   Take 10 mg by mouth every morning.         Marland Kitchen losartan (COZAAR) 100 MG tablet   Oral   Take 100 mg by mouth every evening.         Marland Kitchen oxyCODONE-acetaminophen (PERCOCET) 5-325 MG per tablet   Oral   Take 1 tablet by mouth every 4 (four) hours as needed for pain.   60 tablet   0   . pantoprazole (PROTONIX) 40 MG tablet   Oral   Take 1 tablet (40 mg total) by mouth daily.   30 tablet   11   . warfarin (COUMADIN) 5 MG tablet   Oral   Take 5 mg by mouth daily.          Triage Vitals: BP 124/64  Pulse 75  Temp(Src) 98.4 F (36.9 C) (Oral)  Resp 20  Ht 6' (1.829 m)  Wt 230 lb (104.327 kg)  BMI 31.19 kg/m2  SpO2 100%  Physical Exam  Nursing note and vitals reviewed. Constitutional: He is oriented to person, place, and time. He appears well-developed and well-nourished. No distress.  HENT:  Head: Normocephalic and atraumatic.  Eyes: Conjunctivae and EOM are normal.  Neck: Normal range of motion. Neck supple. No tracheal deviation present.  Cardiovascular: Normal rate, regular rhythm and normal heart sounds.   No murmur heard. Pulmonary/Chest: Effort normal and breath sounds normal. No respiratory distress. He has no wheezes. He has no rales.  Abdominal: Soft. Bowel sounds are normal. There is no tenderness.  Musculoskeletal:  Mild to moderate right knee swelling which is unchanged from baseline, large area of erythema extending from right mid shin to right ankle anteriorly, 2+ distal pulses, nml ROM of right knee. No significant warmth over the  area of erythema.   Neurological: He is alert and oriented to person, place, and time. No cranial nerve deficit.  Skin: Skin is warm and dry.  Psychiatric: He has a normal mood and affect. His behavior is normal.    ED Course   Procedures (including critical care time)  DIAGNOSTIC STUDIES: Oxygen Saturation is 100% on room air, normal by my interpretation.    COORDINATION OF CARE: 3:04 PM-Discussed treatment plan which includes CBC  panel, CMP and INR with pt at bedside and pt agreed to plan.   Labs Reviewed  CBC WITH DIFFERENTIAL - Abnormal; Notable for the following:    RBC 4.00 (*)    Hemoglobin 11.0 (*)    HCT 35.0 (*)    RDW 15.7 (*)    Eosinophils Relative 6 (*)    All other components within normal limits  BASIC METABOLIC PANEL - Abnormal; Notable for the following:    Glucose, Bld 101 (*)    BUN 39 (*)    Creatinine, Ser 3.21 (*)    GFR calc non Af Amer 17 (*)    GFR calc Af Amer 20 (*)    All other components within normal limits  PROTIME-INR - Abnormal; Notable for the following:    Prothrombin Time 37.9 (*)    INR 4.06 (*)    All other components within normal limits   No results found. 1. Cellulitis     MDM  4:07 PM 78 y.o. male pw redness to right anterior shin that has worsened over the last 2-3 days. Pt denies fever, has no systemic sx. Pt AFVSS here. Doubt septic joint as pt has no new knee pain or swelling. Suspect mild cellulitis. Will get screening labwork.   4:08 PM: I interpreted/reviewed the labs, no elev in wbc.Renal insufficiency which is proximal to previous and pt notes he is urinating normally. Pt currently holding coumadin as instructed by pcp and INR today 4.06. Pt continues to appear well, will try outpt abx treatment.  I have discussed the diagnosis/risks/treatment options with the patient and believe the pt to be eligible for discharge home to follow-up with pcp tomorrow. We also discussed returning to the ED immediately if new or worsening sx  occur. We discussed the sx which are most concerning (e.g., fever, body aches, spreading redness, worsening pain) that necessitate immediate return. Any new prescriptions provided to the patient are listed below.  Discharge Medication List as of 10/18/2012  4:10 PM    START taking these medications   Details  clindamycin (CLEOCIN) 150 MG capsule Take 3 capsules (450 mg total) by mouth 3 (three) times daily., Starting 10/18/2012, Last dose on Sat 10/27/12, Print          I personally performed the services described in this documentation, which was scribed in my presence. The recorded information has been reviewed and is accurate.    Junius Argyle, MD 10/19/12 1105

## 2012-10-22 ENCOUNTER — Telehealth (HOSPITAL_COMMUNITY): Payer: Self-pay

## 2012-10-22 DIAGNOSIS — Z7901 Long term (current) use of anticoagulants: Secondary | ICD-10-CM | POA: Diagnosis not present

## 2012-10-23 ENCOUNTER — Ambulatory Visit (HOSPITAL_COMMUNITY): Payer: Medicare Other | Admitting: Physical Therapy

## 2012-10-25 ENCOUNTER — Ambulatory Visit (HOSPITAL_COMMUNITY)
Admission: RE | Admit: 2012-10-25 | Discharge: 2012-10-25 | Disposition: A | Discharge status: Home / Self Care | Payer: Medicare Other | Source: Ambulatory Visit | Attending: Specialist | Admitting: Specialist

## 2012-10-25 ENCOUNTER — Encounter: Payer: Self-pay | Admitting: Vascular Surgery

## 2012-10-25 NOTE — Progress Notes (Signed)
Physical Therapy Treatment Patient Details  Name: Gregory Rangel MRN: [id number] Date of Birth: 1933-07-26  Today's Date: 10/25/2012 Time: 1440-1505 PT Time Calculation (min): 25 min Visit#: 4 of 8  Re-eval: 11/08/12 Authorization: medicare  Authorization Visit#: 4 of 8  Charges:  therex 23'  Subjective: Symptoms/Limitations Symptoms: Pt states he is taking an antibiotic they gave him in the ED.  States Dr. Juanetta Rangel is sending him to a vascular specialists tomorrow for studies on his Rt LE.  Pt continues to present with redness, induration and heat distal Rt LE.  Knee pain today is 7/10.  consulted with Annett Fabian, PT and she contacted MD and left message with nurse Kriste Basque) re: today's therapy.  Instructed per PT to only do mat and ROM activities today. Pain Assessment Currently in Pain?: Yes Pain Score: 7  Pain Location: Leg Pain Orientation: Right   Exercise/Treatments Supine Quad Sets: 10 reps Short Arc Quad Sets: 10 reps Heel Slides: 10 reps Straight Leg Raises: 10 reps Knee Extension: PROM Knee Flexion: PROM      Physical Therapy Assessment and Plan PT Assessment and Plan Clinical Impression Statement: Limited activity today due to increased pain, swelling of Rt LE.  Area outlined of redness on Rt LE.  According to last treating therapist, LE appears to look better today without blistering and less redness, however pt states it does not feel better.  Gentle A/PROM performed today.  Will await results of vascular studies tomorrow morning.  Pt educated on compression stocking benefits.  PT Plan: Continue per PT POC; await results from vascular specialist/further orders from MD.      Problem List Patient Active Problem List   Diagnosis Date Noted  . Cellulitis 10/18/2012  . Postoperative stiffness of total knee replacement 10/09/2012  . Dysphagia, unspecified(787.20) 07/31/2012  . Right knee DJD 07/06/2012  . Lumbago 05/09/2011  . Lumbar spondylosis 05/09/2011     PT - End of Session Activity Tolerance: Patient tolerated treatment well General Behavior During Therapy: [facility] for tasks assessed/performed   Lurena Nida, PTA/CLT 10/25/2012, 3:17 PM

## 2012-10-26 ENCOUNTER — Encounter (INDEPENDENT_AMBULATORY_CARE_PROVIDER_SITE_OTHER): Payer: Medicare Other | Admitting: *Deleted

## 2012-10-26 ENCOUNTER — Ambulatory Visit (INDEPENDENT_AMBULATORY_CARE_PROVIDER_SITE_OTHER): Payer: Medicare Other | Admitting: Vascular Surgery

## 2012-10-26 ENCOUNTER — Other Ambulatory Visit: Payer: Self-pay | Admitting: *Deleted

## 2012-10-26 ENCOUNTER — Encounter: Payer: Self-pay | Admitting: Vascular Surgery

## 2012-10-26 VITALS — BP 144/108 | HR 57 | Ht 72.0 in | Wt 229.0 lb

## 2012-10-26 DIAGNOSIS — Z01810 Encounter for preprocedural cardiovascular examination: Secondary | ICD-10-CM | POA: Diagnosis not present

## 2012-10-26 DIAGNOSIS — I872 Venous insufficiency (chronic) (peripheral): Secondary | ICD-10-CM | POA: Diagnosis not present

## 2012-10-26 DIAGNOSIS — I83893 Varicose veins of bilateral lower extremities with other complications: Secondary | ICD-10-CM

## 2012-10-26 DIAGNOSIS — M7989 Other specified soft tissue disorders: Secondary | ICD-10-CM | POA: Diagnosis not present

## 2012-10-26 NOTE — Progress Notes (Signed)
[facility] [place]  Referred by:  Fredirick Maudlin, MD [address] [place], [place] [address]  Reason for referral: Swollen right leg  History of Present Illness  Gregory Rangel is a 79 y.o. (Dec 20, 1933) male who presents with chief complaint: swollen R leg.  Patient notes, onset of swelling years worsened recently in the setting of R TKR.  The patient's symptoms include: erythematous shin treated as cellulitis for multiple weeks and bilateral leg swelling without any ulcers.  No bursting sensation or heaviness.  The patient has had known history of DVT, no history of varicose vein, no history of venous stasis ulcers, no history of  Lymphedema and no history of skin changes in lower legs.  There is known family history of venous disorders.  The patient has never used compression stockings in the past.  He has used TED hose.  Past Medical History  Diagnosis Date  . Hypertension   . Shortness of breath     with exertion  . Diabetes mellitus without complication   . GERD (gastroesophageal reflux disease)   . Arthritis   . Cancer     colon cancer    Past Surgical History  Procedure Laterality Date  . Back surgery      x 2  . Tonsillectomy    . Appendectomy    . Cholecystectomy    . Eye surgery      right eye with lens implant  . Right knee arthroscopy    . Total knee arthroplasty Right 07/06/2012    Procedure: RIGHT TOTAL KNEE ARTHROPLASTY;  Surgeon: Javier Docker, MD;  Location: [facility];  Service: Orthopedics;  Laterality: Right;  . Esophagogastroduodenoscopy (egd) with esophageal dilation N/A 08/09/2012    Procedure: ESOPHAGOGASTRODUODENOSCOPY (EGD) WITH ESOPHAGEAL DILATION;  Surgeon: Malissa Hippo, MD;  Location: [facility];  Service: Endoscopy;  Laterality: N/A;  155-moved to 830 Ann to notify pt  . Colon surgery      History   Social History  . Marital Status: Married    Spouse Name: N/A    Number of Children: N/A  . Years of  Education: N/A   Occupational History  . Not on file.   Social History Main Topics  . Smoking status: Former Smoker    Quit date: 06/28/1983  . Smokeless tobacco: Not on file  . Alcohol Use: No  . Drug Use: No  . Sexual Activity: Not on file   Other Topics Concern  . Not on file   Social History Narrative  . No narrative on file   History reviewed. No pertinent family history.  Current Outpatient Prescriptions on File Prior to Visit  Medication Sig Dispense Refill  . amLODipine (NORVASC) 10 MG tablet Take 10 mg by mouth every morning.      . benazepril (LOTENSIN) 40 MG tablet Take 40 mg by mouth daily with supper.      . clindamycin (CLEOCIN) 150 MG capsule Take 3 capsules (450 mg total) by mouth 3 (three) times daily.  90 capsule  0  . glipiZIDE (GLUCOTROL XL) 10 MG 24 hr tablet Take 10 mg by mouth every morning.      . pantoprazole (PROTONIX) 40 MG tablet Take 1 tablet (40 mg total) by mouth daily.  30 tablet  11  . warfarin (COUMADIN) 5 MG tablet Take 5 mg by mouth daily.      Marland Kitchen doxycycline (DORYX) 100 MG EC tablet Take 100 mg by mouth  2 (two) times daily.      Marland Kitchen enoxaparin (LOVENOX) 100 MG/ML injection Inject 100 mg into the skin daily.      Marland Kitchen losartan (COZAAR) 100 MG tablet Take 100 mg by mouth every evening.      Marland Kitchen oxyCODONE-acetaminophen (PERCOCET) 5-325 MG per tablet Take 1 tablet by mouth every 4 (four) hours as needed for pain.  60 tablet  0   No current facility-administered medications on file prior to visit.    Allergies  Allergen Reactions  . Penicillins Hives, Swelling and Rash    REVIEW OF SYSTEMS:  (Positives checked otherwise negative)  CARDIOVASCULAR:  []  chest pain, []  chest pressure, []  palpitations, []  shortness of breath when laying flat, []  shortness of breath with exertion,  []  pain in feet when walking, []  pain in feet when laying flat, []  history of blood clot in veins (DVT), [x]  history of phlebitis, [x]  swelling in legs, []  varicose  veins  PULMONARY:  []  productive cough, []  asthma, []  wheezing  NEUROLOGIC:  []  weakness in arms or legs, []  numbness in arms or legs, []  difficulty speaking or slurred speech, []  temporary loss of vision in one eye, []  dizziness  HEMATOLOGIC:  []  bleeding problems, []  problems with blood clotting too easily  MUSCULOSKEL:  []  joint pain, []  joint swelling  GASTROINTEST:  []  vomiting blood, []  blood in stool     GENITOURINARY:  []  burning with urination, []  blood in urine  PSYCHIATRIC:  []  history of major depression  INTEGUMENTARY:  []  rashes, []  ulcers  CONSTITUTIONAL:  []  fever, []  chills  Physical Examination Filed Vitals:   10/26/12 0936  BP: 144/108  Pulse: 57  Height: 6' (1.829 m)  Weight: 229 lb (103.874 kg)  SpO2: 100%   Body mass index is 31.05 kg/(m^2).  General: A&O x 3, WD, obese  Head: Wann/AT  Ear/Nose/Throat: Hearing grossly intact, nares w/o erythema or drainage, oropharynx w/o Erythema/Exudate  Eyes: PERRLA, EOMI  Neck: Supple, no nuchal rigidity, no palpable LAD  Pulmonary: Sym exp, good air movt, CTAB, no rales, rhonchi, & wheezing  Cardiac: RRR, Nl S1, S2, no Murmurs, rubs or gallops  Vascular: Vessel Right Left  Radial Palpable Palpable  Brachial Palpable Palpable  Carotid Palpable, without bruit Palpable, without bruit  Aorta Not palpable due pannus N/A  Femoral Palpable Palpable  Popliteal Not palpable Not palpable  PT Palpable Palpable  DP Palpable Palpable   Gastrointestinal: soft, NTND, -G/R, - HSM, - masses, - CVAT B, large pannus  Musculoskeletal: M/S 5/5 throughout , Extremities without ischemic changes , B 1-2+ edema, ant. Blanching erythema over R shin, no ascending erythema, no TTP to light touch, B spider vein, B varicosities  Neurologic: CN 2-12 intact , Pain and light touch intact in extremities , Motor exam as listed above  Psychiatric: Judgment intact, Mood & affect appropriate for pt's clinical  situation  Dermatologic: See M/S exam for extremity exam, no rashes otherwise noted  Lymph : No Cervical, Axillary, or Inguinal lymphadenopathy   Non-Invasive Vascular Imaging  RLE Venous Insufficiency Duplex (Date: 10/26/2012):   RLE: chronic DVT, - SVT, + GSV reflux, + deep venous reflux  Outside Studies/Documentation 10 pages of outside documents were reviewed including: outpatient clinic charts.  Medical Decision Making  ASKIA HAZELIP is a 79 y.o. male who presents with: BLE chronic venous insufficiency (C4), R chronic DVT, s/p R TKR.   Based on the patient's history and examination, I recommend: compressive therapy and continuing Coumadin  therapy.  I discussed with the patient the use of 20-30 mm thigh high compression stockings and need for 3 month trial of such.  The patient will follow up in 3 months with my partners in [facility] for re-evaluation.  Thank you for allowing [place] to participate in this patient's care.  Leonides Sake, MD [facility] Office: [phone] Pager: [phone]  10/26/2012, 10:12 AM

## 2012-10-30 ENCOUNTER — Ambulatory Visit (HOSPITAL_COMMUNITY)
Admission: RE | Admit: 2012-10-30 | Discharge: 2012-10-30 | Disposition: A | Discharge status: Home / Self Care | Payer: Medicare Other | Source: Ambulatory Visit | Attending: Specialist | Admitting: Specialist

## 2012-10-30 NOTE — Progress Notes (Signed)
Physical Therapy Treatment Patient Details  Name: Gregory Rangel MRN: [id number] Date of Birth: 1933/04/02  Today's Date: 10/30/2012 Time: 5366-4403 PT Time Calculation (min): 43 min  Visit#: 5 of 8  Re-eval: 11/08/12 Charges: Self-care x 4'(7425-9563) Therex 22' (1442-1504) Manual x 8' (1507-1515)  Authorization: medicare  Authorization Visit#: 5 of 8   Subjective: Symptoms/Limitations Symptoms: Pt states that pain in right knee remains the same. He states that vein specialists wrote order for compression stockings. Pain Assessment Currently in Pain?: Yes Pain Score: 7  Pain Location: Leg Pain Orientation: Right   Exercise/Treatments Aerobic Stationary Bike: NuStep 10' hills #3 L4 to increase activity tolerance and strength Standing Heel Raises: 10 reps;Limitations Heel Raises Limitations: Toe raises x 10 Knee Flexion: 10 reps Rocker Board: 2 minutes;Limitations Rocker Board Limitations: R/L Other Standing Knee Exercises: Tandem gait 1 RT Supine Knee Extension: PROM Knee Flexion: PROM   Manual Therapy Manual Therapy: Other (comment) Joint Mobilization: Grade II-III a/p mobs to right knee to increase motion and decrease pain; PROM for flexion and extension  Physical Therapy Assessment and Plan PT Assessment and Plan Clinical Impression Statement: Pt continues to be limited by pain. RLE redness and swelling continues to decrease. Spoke with patient about results from vein specialist visit. Pt is in the process of getting compression stockings. Pt has pain relief with A/P joint mobs of right knee. Progressed standing activities with minimal difficulty. Pt displays improved tolerance for PROM. Encouraged increased stretching at home. PT Plan: Continue per PT POC; await results from vascular specialist/further orders from MD.      Problem List Patient Active Problem List   Diagnosis Date Noted  . Varicose veins of lower extremities with other complications 10/26/2012   . Swelling of limb 10/26/2012  . Chronic venous insufficiency 10/26/2012  . Cellulitis 10/18/2012  . Postoperative stiffness of total knee replacement 10/09/2012  . Dysphagia, unspecified(787.20) 07/31/2012  . Right knee DJD 07/06/2012  . Lumbago 05/09/2011  . Lumbar spondylosis 05/09/2011    PT - End of Session Activity Tolerance: Patient tolerated treatment well General Behavior During Therapy: [facility] for tasks assessed/performed   Seth Bake, PTA  10/30/2012, 4:20 PM

## 2012-11-01 ENCOUNTER — Ambulatory Visit (HOSPITAL_COMMUNITY)
Admission: RE | Admit: 2012-11-01 | Discharge: 2012-11-01 | Disposition: A | Discharge status: Home / Self Care | Payer: Medicare Other | Source: Ambulatory Visit | Attending: Specialist | Admitting: Specialist

## 2012-11-01 NOTE — Progress Notes (Addendum)
Physical Therapy Treatment Patient Details  Name: Gregory Rangel MRN: [id number] Date of Birth: September 26, 1933  Today's Date: 11/01/2012 Time: 4098-1191 PT Time Calculation (min): 45 min Charges: Therex x 28'(1430-1458) Manual x 12 (4782-9562)  Visit#: 6 of 8  Re-eval: 11/08/12   Authorization: medicare   Authorization Visit#: 6 of 8   Subjective: Symptoms/Limitations Symptoms: Pt states that his knee is sore but it feels better than it did. Pain Assessment Currently in Pain?: Yes Pain Score: 6  Pain Location: Knee Pain Orientation: Right  Precautions/Restrictions     Exercise/Treatments Mobility/Balance        Stretches Active Hamstring Stretch: 2 reps;30 seconds (Sitting at edge of mat; instructed for HEP) Quad Stretch: 2 reps;30 seconds (Sitting pulled foot back) Standing Heel Raises: 10 reps;Limitations Heel Raises Limitations: Toe raises x 10 Rocker Board: 2 minutes;Limitations Rocker Board Limitations: R/L Other Standing Knee Exercises: Tandem, side stepping, and retro gait 2 RT each Other Standing Knee Exercises: Standing hip abd and ext x 10 each Supine Knee Extension: PROM Knee Flexion: PROM  Manual Therapy Manual Therapy: Other (comment) Joint Mobilization: Grade II-III a/p mobs to right knee to increase motion and decrease pain; PROM for flexion and extension  Physical Therapy Assessment and Plan PT Assessment and Plan Clinical Impression Statement: Pt's right lower leg presents with increased erythema this session. Contacted MD and more antibiotics were prescribed. Pt displays improved AROM, 12-115. Increased mobility noted with joint mobs. Encouraged pt to emphasize knee extension exercises at home. Pt reports pain decreased to 3/10 at end of session. PT Plan: Continue to progress strength, balance and ROM per PT POC. Watch redness on right lower leg.     Problem List Patient Active Problem List   Diagnosis Date Noted  . Varicose veins of lower  extremities with other complications 10/26/2012  . Swelling of limb 10/26/2012  . Chronic venous insufficiency 10/26/2012  . Cellulitis 10/18/2012  . Postoperative stiffness of total knee replacement 10/09/2012  . Dysphagia, unspecified(787.20) 07/31/2012  . Right knee DJD 07/06/2012  . Lumbago 05/09/2011  . Lumbar spondylosis 05/09/2011    PT - End of Session Activity Tolerance: Patient tolerated treatment well General Behavior During Therapy: [facility] for tasks assessed/performed   Seth Bake, PTA  11/01/2012, 3:32 PM

## 2012-11-05 DIAGNOSIS — E785 Hyperlipidemia, unspecified: Secondary | ICD-10-CM | POA: Diagnosis not present

## 2012-11-05 DIAGNOSIS — I10 Essential (primary) hypertension: Secondary | ICD-10-CM | POA: Diagnosis not present

## 2012-11-05 DIAGNOSIS — Z7901 Long term (current) use of anticoagulants: Secondary | ICD-10-CM | POA: Diagnosis not present

## 2012-11-05 DIAGNOSIS — E109 Type 1 diabetes mellitus without complications: Secondary | ICD-10-CM | POA: Diagnosis not present

## 2012-11-06 ENCOUNTER — Ambulatory Visit (HOSPITAL_COMMUNITY)
Admission: RE | Admit: 2012-11-06 | Discharge: 2012-11-06 | Disposition: A | Discharge status: Home / Self Care | Payer: Medicare Other | Source: Ambulatory Visit | Attending: Pulmonary Disease | Admitting: Pulmonary Disease

## 2012-11-06 NOTE — Progress Notes (Signed)
Physical Therapy Treatment Patient Details  Name: Gregory Rangel MRN: [id number] Date of Birth: 08/20/1933  Today's Date: 11/06/2012 Time: 9629-5284 PT Time Calculation (min): 48 min Charges: Therex x 13'(2440-1027) Manual x 2'(5366-4403) Ice x 47'(4259-5638'  Visit#: 7 of 8  Re-eval: 11/08/12  Authorization: medicare  Authorization Visit#: 7 of 8   Subjective: Symptoms/Limitations Symptoms: Pt states that his knee continues to feel better. He started antibiotic three days ago. Pain Assessment Currently in Pain?: Yes Pain Score: 5  Pain Location: Knee Pain Orientation: Right   Exercise/Treatments Bike x 5' to improve ROM Stretches Active Hamstring Stretch: 2 reps;30 seconds (sitting at edge of mat) Standing Heel Raises: 10 reps;Limitations Heel Raises Limitations: Toe raises x 10 Rocker Board: 2 minutes;Limitations Rocker Board Limitations: R/L A/P Other Standing Knee Exercises: Tandem gait 2 RT each Other Standing Knee Exercises: Standing hip abd and ext x 10 each Supine Knee Extension: PROM Knee Flexion: PROM   Modalities Modalities: Cryotherapy Manual Therapy Manual Therapy: Other (comment) Joint Mobilization: Grade II-III a/p mobs to right knee to increase motion and decrease pain; PROM for flexion and extensiontaken  Myofascial Release: To anterior and posterior right knee to decrease tightness and adhesions and improve motion Cryotherapy Number Minutes Cryotherapy: 10 Minutes Cryotherapy Location: Knee Type of Cryotherapy: Ice pack  Physical Therapy Assessment and Plan PT Assessment and Plan Clinical Impression Statement: Pt tolerates progression of standing exercises well. Over all activity tolerance appears to have improved. Pt also displays improved stability with rocker board and tandem gait. Pt requires multimodal cueing to avoid leaning trunk forward to the side when completing standing abduction and extension. Erythema has decreased. PT Plan:  Continue to progress strength, balance and ROM per PT POC. Watch redness on right lower leg.    Goals    Problem List Patient Active Problem List   Diagnosis Date Noted  . Varicose veins of lower extremities with other complications 10/26/2012  . Swelling of limb 10/26/2012  . Chronic venous insufficiency 10/26/2012  . Cellulitis 10/18/2012  . Postoperative stiffness of total knee replacement 10/09/2012  . Dysphagia, unspecified(787.20) 07/31/2012  . Right knee DJD 07/06/2012  . Lumbago 05/09/2011  . Lumbar spondylosis 05/09/2011    PT - End of Session Activity Tolerance: Patient tolerated treatment well General Behavior During Therapy: [facility] for tasks assessed/performed  Seth Bake, PTA  11/06/2012, 3:16 PM

## 2012-11-08 ENCOUNTER — Ambulatory Visit (HOSPITAL_COMMUNITY)
Admission: RE | Admit: 2012-11-08 | Discharge: 2012-11-08 | Disposition: A | Discharge status: Home / Self Care | Payer: Medicare Other | Source: Ambulatory Visit | Attending: Pulmonary Disease | Admitting: Pulmonary Disease

## 2012-11-08 DIAGNOSIS — L609 Nail disorder, unspecified: Secondary | ICD-10-CM | POA: Diagnosis not present

## 2012-11-08 DIAGNOSIS — E119 Type 2 diabetes mellitus without complications: Secondary | ICD-10-CM | POA: Diagnosis not present

## 2012-11-08 DIAGNOSIS — L851 Acquired keratosis [keratoderma] palmaris et plantaris: Secondary | ICD-10-CM | POA: Diagnosis not present

## 2012-11-08 DIAGNOSIS — E1159 Type 2 diabetes mellitus with other circulatory complications: Secondary | ICD-10-CM | POA: Diagnosis not present

## 2012-11-08 NOTE — Evaluation (Signed)
Physical Therapy Re-evaluation  Patient Details  Name: Gregory Rangel MRN: [id number] Date of Birth: 24-Jun-1933  Today's Date: 11/08/2012 Time: 8413-2440 PT Time Calculation (min): 43 min Charges: MMT/ROMM x 1 [phone]) Self care x 30' [phone])              Visit#: 8 of 8  Re-eval: 11/08/12 Assessment Diagnosis: Rt TKR Surgical Date: 07/06/12 Next MD Visit: 11/26/2012 Prior Therapy: snf/HH  Authorization: medicare    Authorization Visit#: 8 of 8   Past Medical History:  Past Medical History  Diagnosis Date  . Hypertension   . Shortness of breath     with exertion  . Diabetes mellitus without complication   . GERD (gastroesophageal reflux disease)   . Arthritis   . Cancer     colon cancer   Past Surgical History:  Past Surgical History  Procedure Laterality Date  . Back surgery      x 2  . Tonsillectomy    . Appendectomy    . Cholecystectomy    . Eye surgery      right eye with lens implant  . Right knee arthroscopy    . Total knee arthroplasty Right 07/06/2012    Procedure: RIGHT TOTAL KNEE ARTHROPLASTY;  Surgeon: Javier Docker, MD;  Location: [facility];  Service: Orthopedics;  Laterality: Right;  . Esophagogastroduodenoscopy (egd) with esophageal dilation N/A 08/09/2012    Procedure: ESOPHAGOGASTRODUODENOSCOPY (EGD) WITH ESOPHAGEAL DILATION;  Surgeon: Malissa Hippo, MD;  Location: [facility];  Service: Endoscopy;  Laterality: N/A;  155-moved to 830 Ann to notify pt  . Colon surgery      Subjective Symptoms/Limitations Symptoms: Pt states that he is still in the process of getting his compression stockings. Pt states his knee feels good. Pain Assessment Currently in Pain?: No/denies   Sensation/Coordination/Flexibility/Functional Tests Functional Tests Functional Tests: LEFS  49/80 (was 48/80 on 10/09/12)  Assessment RLE AROM (degrees) Right Knee Extension: 10 (was 17 on 10/06/12) Right Knee Flexion: 117 (was 112 on 10/06/12) RLE Strength Right  Hip Flexion: 5/5 (was 5/5 on 10/06/12) Right Hip Extension: 5/5 (was 3-/5 on 10/06/12) Right Hip ABduction: 5/5 (was 5/5 on 10/06/12) Right Knee Flexion: 5/5 (was 4/5 on 10/06/12) Right Knee Extension: 5/5 (was 4/5 on 10/06/12) Right Ankle Dorsiflexion: 5/5 (was 3+/5 on 10/06/12)   Physical Therapy Assessment and Plan PT Assessment and Plan Clinical Impression Statement: Pt has progressed well with therapy. Pt display improvement in strength and ROM. Pt is still lack significant amount of knee extension. Pt present with gait deviations due to lack of ROM. Pt would benefit from continuing skilled PT to improve ROM and gait mechanics. PT Plan: Recommend to continue PT 2x per week for 4 more weeks.    Goals Home Exercise Program Pt/caregiver will Perform Home Exercise Program: For increased ROM PT Short Term Goals PT Short Term Goal 1: ROM 9-115 to allow more normalized gt PT Short Term Goal 1 - Progress: Progressing toward goal PT Short Term Goal 2: Pt pain to be no greater than a 4/10 PT Short Term Goal 2 - Progress: Met PT Short Term Goal 3: Pt to be able to walk inside without his cane PT Short Term Goal 3 - Progress: Met PT Short Term Goal 4: Pt to be able to get in and out of his car without difficullty (Pt has not difficulty with truck/ Minmal difficulty with car) PT Short Term Goal 4 - Progress: Partly met PT Long Term Goals Time to  Complete Long Term Goals: 4 weeks PT Long Term Goal 1: Pt to ROM to be 0-120 to allow pt to squat down with confidence PT Long Term Goal 1 - Progress: Progressing toward goal PT Long Term Goal 2: Pt to be able to walk inside and outside without his cane PT Long Term Goal 2 - Progress: Progressing toward goal Long Term Goal 3: Pt LEFS to be improved by 10 pt Long Term Goal 3 Progress: Progressing toward goal Long Term Goal 4: Pt pain to be no greater than a 2/10 80% of the day Long Term Goal 4 Progress: Progressing toward goal PT Long Term Goal 5: Pt to  be able to go up and down 5 steps reciprocally  Long Term Goal 5 Progress: Met  Problem List Patient Active Problem List   Diagnosis Date Noted  . Varicose veins of lower extremities with other complications 10/26/2012  . Swelling of limb 10/26/2012  . Chronic venous insufficiency 10/26/2012  . Cellulitis 10/18/2012  . Postoperative stiffness of total knee replacement 10/09/2012  . Dysphagia, unspecified(787.20) 07/31/2012  . Right knee DJD 07/06/2012  . Lumbago 05/09/2011  . Lumbar spondylosis 05/09/2011    PT - End of Session Activity Tolerance: Patient tolerated treatment well General Behavior During Therapy: [facility] for tasks assessed/performed  GP Functional Assessment Tool Used: LEFS 49/80 (Limited 39%) Functional Limitation: Self care Self Care Current Status (U4403): At least 20 percent but less than 40 percent impaired, limited or restricted Self Care Goal Status (K7425): At least 1 percent but less than 20 percent impaired, limited or restricted  Seth Bake, PTA 11/08/2012, 5:06 PM  Physician Documentation Your signature is required to indicate approval of the treatment plan as stated above.  Please sign and either send electronically or make a copy of this report for your files and return this physician signed original.   Please mark one 1.__approve of plan  2. ___approve of plan with the following conditions.   ______________________________                                                          _____________________ Physician Signature                                                                                                             Date

## 2012-12-04 DIAGNOSIS — Z7901 Long term (current) use of anticoagulants: Secondary | ICD-10-CM | POA: Diagnosis not present

## 2012-12-11 DIAGNOSIS — Z23 Encounter for immunization: Secondary | ICD-10-CM | POA: Diagnosis not present

## 2012-12-11 DIAGNOSIS — I82409 Acute embolism and thrombosis of unspecified deep veins of unspecified lower extremity: Secondary | ICD-10-CM | POA: Diagnosis not present

## 2012-12-11 DIAGNOSIS — I509 Heart failure, unspecified: Secondary | ICD-10-CM | POA: Diagnosis not present

## 2012-12-11 DIAGNOSIS — I10 Essential (primary) hypertension: Secondary | ICD-10-CM | POA: Diagnosis not present

## 2012-12-11 DIAGNOSIS — E1129 Type 2 diabetes mellitus with other diabetic kidney complication: Secondary | ICD-10-CM | POA: Diagnosis not present

## 2012-12-11 DIAGNOSIS — M1990 Unspecified osteoarthritis, unspecified site: Secondary | ICD-10-CM | POA: Diagnosis not present

## 2012-12-11 DIAGNOSIS — E109 Type 1 diabetes mellitus without complications: Secondary | ICD-10-CM | POA: Diagnosis not present

## 2012-12-24 DIAGNOSIS — Z7901 Long term (current) use of anticoagulants: Secondary | ICD-10-CM | POA: Diagnosis not present

## 2013-01-10 DIAGNOSIS — E119 Type 2 diabetes mellitus without complications: Secondary | ICD-10-CM | POA: Diagnosis not present

## 2013-01-10 DIAGNOSIS — E1149 Type 2 diabetes mellitus with other diabetic neurological complication: Secondary | ICD-10-CM | POA: Diagnosis not present

## 2013-01-23 ENCOUNTER — Other Ambulatory Visit (HOSPITAL_COMMUNITY): Payer: Self-pay | Admitting: Pulmonary Disease

## 2013-01-23 ENCOUNTER — Ambulatory Visit (HOSPITAL_COMMUNITY)
Admission: RE | Admit: 2013-01-23 | Discharge: 2013-01-23 | Disposition: A | Discharge status: Home / Self Care | Payer: Medicare Other | Source: Ambulatory Visit | Attending: Pulmonary Disease | Admitting: Pulmonary Disease

## 2013-01-23 DIAGNOSIS — I10 Essential (primary) hypertension: Secondary | ICD-10-CM | POA: Diagnosis not present

## 2013-01-23 DIAGNOSIS — E1129 Type 2 diabetes mellitus with other diabetic kidney complication: Secondary | ICD-10-CM | POA: Diagnosis not present

## 2013-01-23 DIAGNOSIS — M25562 Pain in left knee: Secondary | ICD-10-CM

## 2013-01-23 DIAGNOSIS — Z7901 Long term (current) use of anticoagulants: Secondary | ICD-10-CM | POA: Diagnosis not present

## 2013-01-23 DIAGNOSIS — M25569 Pain in unspecified knee: Secondary | ICD-10-CM | POA: Diagnosis not present

## 2013-01-23 DIAGNOSIS — M1710 Unilateral primary osteoarthritis, unspecified knee: Secondary | ICD-10-CM | POA: Diagnosis not present

## 2013-01-29 ENCOUNTER — Ambulatory Visit: Payer: Medicare Other | Admitting: Vascular Surgery

## 2013-01-29 DIAGNOSIS — Z85828 Personal history of other malignant neoplasm of skin: Secondary | ICD-10-CM | POA: Diagnosis not present

## 2013-01-29 DIAGNOSIS — L570 Actinic keratosis: Secondary | ICD-10-CM | POA: Diagnosis not present

## 2013-01-31 DIAGNOSIS — E1129 Type 2 diabetes mellitus with other diabetic kidney complication: Secondary | ICD-10-CM | POA: Diagnosis not present

## 2013-01-31 DIAGNOSIS — N189 Chronic kidney disease, unspecified: Secondary | ICD-10-CM | POA: Diagnosis not present

## 2013-01-31 DIAGNOSIS — D649 Anemia, unspecified: Secondary | ICD-10-CM | POA: Diagnosis not present

## 2013-01-31 DIAGNOSIS — Z79899 Other long term (current) drug therapy: Secondary | ICD-10-CM | POA: Diagnosis not present

## 2013-01-31 DIAGNOSIS — R809 Proteinuria, unspecified: Secondary | ICD-10-CM | POA: Diagnosis not present

## 2013-01-31 DIAGNOSIS — E559 Vitamin D deficiency, unspecified: Secondary | ICD-10-CM | POA: Diagnosis not present

## 2013-02-05 DIAGNOSIS — R809 Proteinuria, unspecified: Secondary | ICD-10-CM | POA: Diagnosis not present

## 2013-02-05 DIAGNOSIS — E559 Vitamin D deficiency, unspecified: Secondary | ICD-10-CM | POA: Diagnosis not present

## 2013-02-05 DIAGNOSIS — N184 Chronic kidney disease, stage 4 (severe): Secondary | ICD-10-CM | POA: Diagnosis not present

## 2013-02-05 DIAGNOSIS — I10 Essential (primary) hypertension: Secondary | ICD-10-CM | POA: Diagnosis not present

## 2013-02-11 ENCOUNTER — Encounter: Payer: Self-pay | Admitting: Vascular Surgery

## 2013-02-12 ENCOUNTER — Ambulatory Visit (INDEPENDENT_AMBULATORY_CARE_PROVIDER_SITE_OTHER): Payer: Medicare Other | Admitting: Vascular Surgery

## 2013-02-12 ENCOUNTER — Ambulatory Visit: Payer: Medicare Other | Admitting: Vascular Surgery

## 2013-02-12 ENCOUNTER — Encounter: Payer: Self-pay | Admitting: Vascular Surgery

## 2013-02-12 ENCOUNTER — Encounter (HOSPITAL_COMMUNITY): Payer: Medicare Other

## 2013-02-12 ENCOUNTER — Ambulatory Visit (HOSPITAL_COMMUNITY)
Admission: RE | Admit: 2013-02-12 | Discharge: 2013-02-12 | Disposition: A | Discharge status: Home / Self Care | Payer: Medicare Other | Source: Ambulatory Visit | Attending: Vascular Surgery | Admitting: Vascular Surgery

## 2013-02-12 VITALS — BP 141/68 | HR 65 | Resp 18 | Ht 72.0 in | Wt 237.9 lb

## 2013-02-12 DIAGNOSIS — I83893 Varicose veins of bilateral lower extremities with other complications: Secondary | ICD-10-CM | POA: Insufficient documentation

## 2013-02-12 NOTE — Progress Notes (Signed)
Problems with Activities of Daily Living Secondary to Leg Pain  1. Gregory Rangel states that any activities that require prolonged standing ( house cleaning, yard work) are very difficult due to leg pain.   2. Gregory Rangel states that going up and down stairs is difficult due to leg pain.       Failure of  Conservative Therapy:  1. Worn 20-30 mm Hg thigh high compression hose >3 months with no relief of symptoms.  2. Frequently elevates legs-no relief of symptoms  3. Taken Ibuprofen 600 Mg TID with no relief of symptoms.  The patient continues to have increasing discomfort with lower extremity swelling. He has had no significant improvement with compression garments. He does continue to elevate his legs as much as possible. His history is of a prior right DVT with back surgery several years ago and recurrent DVT time of his knee surgery.  On physical exam he does have marked erythema related to his venous hypertension from mid tibial level distal down to his ankle. He has marked swelling in his right leg versus his left leg.  Venous duplex shows some chronic DVT in his femoral and popliteal vein. This is not flow limiting and encompasses a small percentage of his flow lumen. He does have an enlarged great saphenous vein throughout its course with reflux present.  Impression and plan progressive venous hypertension related to deep and superficial venous incompetence. A long discussion with the patient and his wife present. I have recommended laser ablation of his right great saphenous vein for removal of the source for venous hypertension. I did explain we will continue to have to wear a compression and elevation possible due to was deep venous reflux. They understand and wish to proceed. He is on Coumadin therapy. This partially 4 days prior to the procedure and then resume immediately following the procedure. I did discuss a slight risk of DVT associated with the procedure.

## 2013-02-19 ENCOUNTER — Telehealth: Payer: Self-pay | Admitting: Vascular Surgery

## 2013-02-19 ENCOUNTER — Other Ambulatory Visit: Payer: Self-pay | Admitting: *Deleted

## 2013-02-19 DIAGNOSIS — I83893 Varicose veins of bilateral lower extremities with other complications: Secondary | ICD-10-CM

## 2013-02-19 DIAGNOSIS — M79609 Pain in unspecified limb: Secondary | ICD-10-CM

## 2013-02-19 NOTE — Telephone Encounter (Signed)
Message copied by Fredrich Birks on Tue Feb 19, 2013  4:02 PM ------      Message from: Domenic Moras D      Created: Tue Feb 19, 2013  3:06 PM      Regarding: scheduling       Please schedule Mr. Upchurch for post laser ablation duplex (right leg, order in EPIC) and [facility] FU with Dr. Arbie Cookey on 05-02-2013.  Thanks! ------

## 2013-03-21 DIAGNOSIS — E119 Type 2 diabetes mellitus without complications: Secondary | ICD-10-CM | POA: Diagnosis not present

## 2013-03-21 DIAGNOSIS — E1149 Type 2 diabetes mellitus with other diabetic neurological complication: Secondary | ICD-10-CM | POA: Diagnosis not present

## 2013-04-05 ENCOUNTER — Other Ambulatory Visit (HOSPITAL_COMMUNITY): Payer: Self-pay | Admitting: Pulmonary Disease

## 2013-04-05 DIAGNOSIS — I8290 Acute embolism and thrombosis of unspecified vein: Secondary | ICD-10-CM

## 2013-04-08 ENCOUNTER — Ambulatory Visit (HOSPITAL_COMMUNITY)
Admission: RE | Admit: 2013-04-08 | Discharge: 2013-04-08 | Disposition: A | Discharge status: Home / Self Care | Payer: Medicare Other | Source: Ambulatory Visit | Attending: Pulmonary Disease | Admitting: Pulmonary Disease

## 2013-04-08 DIAGNOSIS — I825Y9 Chronic embolism and thrombosis of unspecified deep veins of unspecified proximal lower extremity: Secondary | ICD-10-CM | POA: Diagnosis not present

## 2013-04-08 DIAGNOSIS — R609 Edema, unspecified: Secondary | ICD-10-CM | POA: Diagnosis not present

## 2013-04-08 DIAGNOSIS — I8290 Acute embolism and thrombosis of unspecified vein: Secondary | ICD-10-CM

## 2013-04-08 DIAGNOSIS — M79609 Pain in unspecified limb: Secondary | ICD-10-CM | POA: Diagnosis not present

## 2013-04-22 DIAGNOSIS — Z7901 Long term (current) use of anticoagulants: Secondary | ICD-10-CM | POA: Diagnosis not present

## 2013-04-23 ENCOUNTER — Telehealth: Payer: Self-pay | Admitting: *Deleted

## 2013-04-23 NOTE — Telephone Encounter (Signed)
Returning Mr. Gregory Rangel's phone call regarding questions about upcoming office surgery scheduled for 04-25-2013.  Mr. Gregory Rangel is on Coumadin therapy (Coumadin 5 mg daily) and was instructed to stop Coumadin 4 days prior to office surgery (would take no Coumadin 04-21-2013--04-25-2013).  Mr. Gregory Rangel states he took 1/2 Coumadin tablet (2.5 mg) on 04-21-2013.  Reviewed Mr. Gregory Rangel's history with Dr. Donnetta Hutching and made him aware that Mr. Gregory Rangel took Coumadin (2.5 MG) on 04-21-2013.  Dr. Donnetta Hutching cleared proceeding with endovenous laser ablation on 04-25-2013.  Notified Mr. Gregory Rangel (by telephone) that office surgery has been cleared by Dr. Donnetta Hutching for 04-25-2013.  Mr. Gregory Rangel has not taken Coumadin since 04-21-2013 and is aware to discontinue Coumadin until after the endovenous laser ablation.  Dr. Donnetta Hutching will direct him on when to resume Coumadin post laser ablation.  Reviewed all pre-procedure instructions with Mr. Gregory Rangel.   Mr. Gregory Rangel verbalized understanding of all instructions.

## 2013-04-24 ENCOUNTER — Encounter: Payer: Self-pay | Admitting: Vascular Surgery

## 2013-04-25 ENCOUNTER — Encounter: Payer: Self-pay | Admitting: Vascular Surgery

## 2013-04-25 ENCOUNTER — Ambulatory Visit (INDEPENDENT_AMBULATORY_CARE_PROVIDER_SITE_OTHER): Payer: Medicare Other | Admitting: Vascular Surgery

## 2013-04-25 VITALS — BP 150/82 | HR 77 | Resp 18 | Ht 71.0 in | Wt 241.0 lb

## 2013-04-25 DIAGNOSIS — I83893 Varicose veins of bilateral lower extremities with other complications: Secondary | ICD-10-CM | POA: Diagnosis not present

## 2013-04-25 HISTORY — PX: ENDOVENOUS ABLATION SAPHENOUS VEIN W/ LASER: SUR449

## 2013-04-25 NOTE — Progress Notes (Signed)
   Laser Ablation Procedure      Date: 04/25/2013    Gregory Rangel DOB:1934-03-03  Consent signed: Yes  Surgeon:T.F. Early  Procedure: Laser Ablation: right Greater Saphenous Vein  BP 150/82  Pulse 77  Resp 18  Ht 5\' 11"  (1.803 m)  Wt 241 lb (109.317 kg)  BMI 33.63 kg/m2  Start time: 10:35AM   End time: 11:50AM  Tumescent Anesthesia: 450 cc 0.9% NaCl with 50 cc Lidocaine HCL with 1% Epi and 15 cc 8.4% NaHCO3  Local Anesthesia: 4 cc Lidocaine HCL and NaHCO3 (ratio 2:1)  Continuous Mode: 15 Watts Total Energy 2995 Joules Total Time3:19       Patient tolerated procedure well: Yes  Notes: Mr. Mealey took last dose of Coumadin (2.5 mg) tablet on 04-21-2013.    Description of Procedure:  After marking the course of the saphenous vein and the secondary varicosities in the standing position, the patient was placed on the operating table in the supine position, and the right leg was prepped and draped in sterile fashion. Local anesthetic was administered, and under ultrasound guidance the saphenous vein was accessed with a micro needle and guide wire; then the micro puncture sheath was placed. A guide wire was inserted to the saphenofemoral junction, followed by a 5 french sheath.  The position of the sheath and then the laser fiber below the junction was confirmed using the ultrasound and visualization of the aiming beam.  Tumescent anesthesia was administered along the course of the saphenous vein using ultrasound guidance. Protective laser glasses were placed on the patient, and the laser was fired at at 15 watt continuous mode.  For a total of 2995 joules.  A steri strip was applied to the puncture site.     ABD pads and thigh high compression stockings were applied.  Ace wrap bandages were applied over the phlebectomy sites and at the top of the saphenofemoral junction.  Blood loss was less than 15 cc.  The patient ambulated out of the operating room having tolerated the  procedure well.

## 2013-04-29 ENCOUNTER — Telehealth: Payer: Self-pay | Admitting: *Deleted

## 2013-04-29 NOTE — Telephone Encounter (Signed)
    04/29/2013  Time: 8:56 AM   Patient Name: Gregory Rangel  Patient of: T.F. Early  Procedure:Laser Ablation right greater saphenous vein  04-25-2013  Reached patient at home and checked  His status  Yes    Comments/Actions Taken: [name] states that his right inner thigh and groin is "sore."  He is using an ice compress, elevating his right leg when sitting, and wearing his compression dressing. He is unable to take Ibuprofen due kidney problems.  Reviewed post procedural instructions with [name] and reminded him of post laser ablation duplex and follow up with Dr. Donnetta Hutching on 05-02-2013. [name] states he restarted Coumadin per Dr. Luther Parody recommendation last night on 04-25-2013.     @SIGNATURE @

## 2013-05-01 ENCOUNTER — Encounter: Payer: Self-pay | Admitting: Vascular Surgery

## 2013-05-01 ENCOUNTER — Telehealth: Payer: Self-pay | Admitting: *Deleted

## 2013-05-01 NOTE — Telephone Encounter (Signed)
Gregory Rangel states he is concerned about the weather and traveling tomorrow for his post laser ablation duplex and FU appointment with Dr. Donnetta Hutching.  Explained that the post laser ablation duplex is important to assess that the greater saphenous vein is closed and that there are no complications.  Encouraged him to come tomorrow for the ultrasound and FU appointment if he felt safe traveling.  Asked that if he decided tomorrow morning he could not safely travel,  to call [facility] and cancel the appointment and ultrasound and we would reschedule it for the following Thursday (05-09-2013).  Mr. Jeppsen verbalized understanding.

## 2013-05-02 ENCOUNTER — Ambulatory Visit (INDEPENDENT_AMBULATORY_CARE_PROVIDER_SITE_OTHER): Payer: Medicare Other | Admitting: Vascular Surgery

## 2013-05-02 ENCOUNTER — Encounter: Payer: Self-pay | Admitting: Vascular Surgery

## 2013-05-02 ENCOUNTER — Ambulatory Visit (HOSPITAL_COMMUNITY)
Admission: RE | Admit: 2013-05-02 | Discharge: 2013-05-02 | Disposition: A | Discharge status: Home / Self Care | Payer: Medicare Other | Source: Ambulatory Visit | Attending: Vascular Surgery | Admitting: Vascular Surgery

## 2013-05-02 VITALS — BP 156/90 | HR 83 | Resp 18 | Ht 72.0 in | Wt 240.0 lb

## 2013-05-02 DIAGNOSIS — M79609 Pain in unspecified limb: Secondary | ICD-10-CM

## 2013-05-02 DIAGNOSIS — Z09 Encounter for follow-up examination after completed treatment for conditions other than malignant neoplasm: Secondary | ICD-10-CM | POA: Insufficient documentation

## 2013-05-02 DIAGNOSIS — I83893 Varicose veins of bilateral lower extremities with other complications: Secondary | ICD-10-CM

## 2013-05-02 DIAGNOSIS — Z9889 Other specified postprocedural states: Secondary | ICD-10-CM | POA: Insufficient documentation

## 2013-05-02 NOTE — Progress Notes (Signed)
The patient presents today for followup of his right great saphenous vein laser ablation on 04/25/2013. He has typical of soreness over the ablation site. He is unable to take ibuprofen the 2 renal insufficiency concerns and therefore said more than the usual of soreness. He has minimal bruising. He has been compliant with his compression garment.  On physical exam there is minimal bruising and no difficulty at the insertion site. He does have some tenderness over the medial aspect of his thigh.  Venous duplex today reveals closure of his great saphenous vein from the level of the saphenofemoral junction down to the distal insertion site in the upper calf. 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 No other DVT.  Impression and plan: Stable initial results after her right great saphenous ablation. He will continue his thigh high compression for one additional week. We have fitted today with knee-high compression and obstruction or this is much as possible on a daily basis. He does have deep venous reflux which is in part contributing to his venous hypertension he.. He understands the need for elevation and compression. He'll see [place] on an as-needed basis

## 2013-05-20 DIAGNOSIS — N189 Chronic kidney disease, unspecified: Secondary | ICD-10-CM | POA: Diagnosis not present

## 2013-05-20 DIAGNOSIS — E559 Vitamin D deficiency, unspecified: Secondary | ICD-10-CM | POA: Diagnosis not present

## 2013-05-20 DIAGNOSIS — E1129 Type 2 diabetes mellitus with other diabetic kidney complication: Secondary | ICD-10-CM | POA: Diagnosis not present

## 2013-05-20 DIAGNOSIS — Z79899 Other long term (current) drug therapy: Secondary | ICD-10-CM | POA: Diagnosis not present

## 2013-05-20 DIAGNOSIS — R809 Proteinuria, unspecified: Secondary | ICD-10-CM | POA: Diagnosis not present

## 2013-05-20 DIAGNOSIS — D649 Anemia, unspecified: Secondary | ICD-10-CM | POA: Diagnosis not present

## 2013-05-21 DIAGNOSIS — I10 Essential (primary) hypertension: Secondary | ICD-10-CM | POA: Diagnosis not present

## 2013-05-21 DIAGNOSIS — E1129 Type 2 diabetes mellitus with other diabetic kidney complication: Secondary | ICD-10-CM | POA: Diagnosis not present

## 2013-05-21 DIAGNOSIS — R809 Proteinuria, unspecified: Secondary | ICD-10-CM | POA: Diagnosis not present

## 2013-05-21 DIAGNOSIS — I509 Heart failure, unspecified: Secondary | ICD-10-CM | POA: Diagnosis not present

## 2013-05-21 DIAGNOSIS — N184 Chronic kidney disease, stage 4 (severe): Secondary | ICD-10-CM | POA: Diagnosis not present

## 2013-05-23 DIAGNOSIS — Z7901 Long term (current) use of anticoagulants: Secondary | ICD-10-CM | POA: Diagnosis not present

## 2013-05-30 DIAGNOSIS — E119 Type 2 diabetes mellitus without complications: Secondary | ICD-10-CM | POA: Diagnosis not present

## 2013-05-30 DIAGNOSIS — E1149 Type 2 diabetes mellitus with other diabetic neurological complication: Secondary | ICD-10-CM | POA: Diagnosis not present

## 2013-05-30 DIAGNOSIS — Z7901 Long term (current) use of anticoagulants: Secondary | ICD-10-CM | POA: Diagnosis not present

## 2013-06-12 DIAGNOSIS — Z7901 Long term (current) use of anticoagulants: Secondary | ICD-10-CM | POA: Diagnosis not present

## 2013-07-18 DIAGNOSIS — R809 Proteinuria, unspecified: Secondary | ICD-10-CM | POA: Diagnosis not present

## 2013-07-18 DIAGNOSIS — N184 Chronic kidney disease, stage 4 (severe): Secondary | ICD-10-CM | POA: Diagnosis not present

## 2013-07-18 DIAGNOSIS — D649 Anemia, unspecified: Secondary | ICD-10-CM | POA: Diagnosis not present

## 2013-07-18 DIAGNOSIS — E559 Vitamin D deficiency, unspecified: Secondary | ICD-10-CM | POA: Diagnosis not present

## 2013-07-22 DIAGNOSIS — Z7901 Long term (current) use of anticoagulants: Secondary | ICD-10-CM | POA: Diagnosis not present

## 2013-07-23 DIAGNOSIS — E1129 Type 2 diabetes mellitus with other diabetic kidney complication: Secondary | ICD-10-CM | POA: Diagnosis not present

## 2013-07-23 DIAGNOSIS — E872 Acidosis, unspecified: Secondary | ICD-10-CM | POA: Diagnosis not present

## 2013-07-23 DIAGNOSIS — I10 Essential (primary) hypertension: Secondary | ICD-10-CM | POA: Diagnosis not present

## 2013-07-23 DIAGNOSIS — I509 Heart failure, unspecified: Secondary | ICD-10-CM | POA: Diagnosis not present

## 2013-07-23 DIAGNOSIS — R809 Proteinuria, unspecified: Secondary | ICD-10-CM | POA: Diagnosis not present

## 2013-07-23 DIAGNOSIS — N184 Chronic kidney disease, stage 4 (severe): Secondary | ICD-10-CM | POA: Diagnosis not present

## 2013-08-01 DIAGNOSIS — E1129 Type 2 diabetes mellitus with other diabetic kidney complication: Secondary | ICD-10-CM | POA: Diagnosis not present

## 2013-08-01 DIAGNOSIS — Z79899 Other long term (current) drug therapy: Secondary | ICD-10-CM | POA: Diagnosis not present

## 2013-08-01 DIAGNOSIS — N184 Chronic kidney disease, stage 4 (severe): Secondary | ICD-10-CM | POA: Diagnosis not present

## 2013-08-07 DIAGNOSIS — Z7901 Long term (current) use of anticoagulants: Secondary | ICD-10-CM | POA: Diagnosis not present

## 2013-08-08 DIAGNOSIS — E1149 Type 2 diabetes mellitus with other diabetic neurological complication: Secondary | ICD-10-CM | POA: Diagnosis not present

## 2013-08-08 DIAGNOSIS — E119 Type 2 diabetes mellitus without complications: Secondary | ICD-10-CM | POA: Diagnosis not present

## 2013-08-26 ENCOUNTER — Other Ambulatory Visit (INDEPENDENT_AMBULATORY_CARE_PROVIDER_SITE_OTHER): Payer: Self-pay | Admitting: Internal Medicine

## 2013-08-26 NOTE — Telephone Encounter (Signed)
Per Dr.Rehman may refill with 3 refills.Patient will need a office appointment prior to more refills.

## 2013-09-02 DIAGNOSIS — N184 Chronic kidney disease, stage 4 (severe): Secondary | ICD-10-CM | POA: Diagnosis not present

## 2013-09-19 DIAGNOSIS — Z7901 Long term (current) use of anticoagulants: Secondary | ICD-10-CM | POA: Diagnosis not present

## 2013-09-30 DIAGNOSIS — R809 Proteinuria, unspecified: Secondary | ICD-10-CM | POA: Diagnosis not present

## 2013-09-30 DIAGNOSIS — E559 Vitamin D deficiency, unspecified: Secondary | ICD-10-CM | POA: Diagnosis not present

## 2013-09-30 DIAGNOSIS — D649 Anemia, unspecified: Secondary | ICD-10-CM | POA: Diagnosis not present

## 2013-09-30 DIAGNOSIS — N184 Chronic kidney disease, stage 4 (severe): Secondary | ICD-10-CM | POA: Diagnosis not present

## 2013-10-08 DIAGNOSIS — R809 Proteinuria, unspecified: Secondary | ICD-10-CM | POA: Diagnosis not present

## 2013-10-08 DIAGNOSIS — E1129 Type 2 diabetes mellitus with other diabetic kidney complication: Secondary | ICD-10-CM | POA: Diagnosis not present

## 2013-10-08 DIAGNOSIS — I10 Essential (primary) hypertension: Secondary | ICD-10-CM | POA: Diagnosis not present

## 2013-10-08 DIAGNOSIS — N184 Chronic kidney disease, stage 4 (severe): Secondary | ICD-10-CM | POA: Diagnosis not present

## 2013-10-10 DIAGNOSIS — Z7901 Long term (current) use of anticoagulants: Secondary | ICD-10-CM | POA: Diagnosis not present

## 2013-10-18 DIAGNOSIS — E1149 Type 2 diabetes mellitus with other diabetic neurological complication: Secondary | ICD-10-CM | POA: Diagnosis not present

## 2013-10-18 DIAGNOSIS — L851 Acquired keratosis [keratoderma] palmaris et plantaris: Secondary | ICD-10-CM | POA: Diagnosis not present

## 2013-10-18 DIAGNOSIS — B351 Tinea unguium: Secondary | ICD-10-CM | POA: Diagnosis not present

## 2013-10-21 DIAGNOSIS — E118 Type 2 diabetes mellitus with unspecified complications: Secondary | ICD-10-CM | POA: Diagnosis not present

## 2013-10-21 DIAGNOSIS — N19 Unspecified kidney failure: Secondary | ICD-10-CM | POA: Diagnosis not present

## 2013-10-21 DIAGNOSIS — I4891 Unspecified atrial fibrillation: Secondary | ICD-10-CM | POA: Diagnosis not present

## 2013-10-21 DIAGNOSIS — I10 Essential (primary) hypertension: Secondary | ICD-10-CM | POA: Diagnosis not present

## 2013-10-29 DIAGNOSIS — E119 Type 2 diabetes mellitus without complications: Secondary | ICD-10-CM | POA: Diagnosis not present

## 2013-10-29 DIAGNOSIS — H2510 Age-related nuclear cataract, unspecified eye: Secondary | ICD-10-CM | POA: Diagnosis not present

## 2013-10-29 DIAGNOSIS — Z961 Presence of intraocular lens: Secondary | ICD-10-CM | POA: Diagnosis not present

## 2013-10-30 ENCOUNTER — Encounter (HOSPITAL_COMMUNITY): Payer: Self-pay | Admitting: Pharmacy Technician

## 2013-10-30 NOTE — Patient Instructions (Signed)
Gregory Rangel  10/30/2013   Your procedure is scheduled on: 11/05/2013  Report to [facility] at 8:00 AM.  Call this number if you have problems the morning of surgery: [phone]   Remember:   Do not eat food or drink liquids after midnight.   Take these medicines the morning of surgery with A SIP OF WATER: Xanax, Amlodipine, Lotensin, Protonix  DO NOT TAKE GLIPIZIDE   Do not wear jewelry, make-up or nail polish.  Do not wear lotions, powders, or perfumes. You may wear deodorant.  Do not shave 48 hours prior to surgery. Men may shave face and neck.  Do not bring valuables to the hospital.  [facility] is not responsible for any belongings or valuables.               Contacts, dentures or bridgework may not be worn into surgery.  Leave suitcase in the car. After surgery it may be brought to your room.  For patients admitted to the hospital, discharge time is determined by your treatment team.               Patients discharged the day of surgery will not be allowed to drive home.  Name and phone number of your driver:   Special Instructions: N/A   Please read over the following fact sheets that you were given: Anesthesia Post-op Instructions   PATIENT INSTRUCTIONS POST-ANESTHESIA  IMMEDIATELY FOLLOWING SURGERY:  Do not drive or operate machinery for the first twenty four hours after surgery.  Do not make any important decisions for twenty four hours after surgery or while taking narcotic pain medications or sedatives.  If you develop intractable nausea and vomiting or a severe headache please notify your doctor immediately.  FOLLOW-UP:  Please make an appointment with your surgeon as instructed. You do not need to follow up with anesthesia unless specifically instructed to do so.  WOUND CARE INSTRUCTIONS (if applicable):  Keep a dry clean dressing on the anesthesia/puncture wound site if there is drainage.  Once the wound has quit draining you may leave it open to air.  Generally  you should leave the bandage intact for twenty four hours unless there is drainage.  If the epidural site drains for more than 36-48 hours please call the anesthesia department.  QUESTIONS?:  Please feel free to call your physician or the hospital operator if you have any questions, and they will be happy to assist you.      Cataract Surgery  A cataract is a clouding of the lens of the eye. When a lens becomes cloudy, vision is reduced based on the degree and nature of the clouding. Surgery may be needed to improve vision. Surgery removes the cloudy lens and usually replaces it with a substitute lens (intraocular lens, IOL). LET YOUR EYE DOCTOR KNOW ABOUT:  Allergies to food or medicine.  Medicines taken including herbs, eye drops, over-the-counter medicines, and creams.  Use of steroids (by mouth or creams).  Previous problems with anesthetics or numbing medicine.  History of bleeding problems or blood clots.  Previous surgery.  Other health problems, including diabetes and kidney problems.  Possibility of pregnancy, if this applies. RISKS AND COMPLICATIONS  Infection.  Inflammation of the eyeball (endophthalmitis) that can spread to both eyes (sympathetic ophthalmia).  Poor wound healing.  If an IOL is inserted, it can later fall out of proper position. This is very uncommon.  Clouding of the part of your eye that holds an IOL in  place. This is called an "after-cataract." These are uncommon but easily treated. BEFORE THE PROCEDURE  Do not eat or drink anything except small amounts of water for 8 to 12 before your surgery, or as directed by your caregiver.  Unless you are told otherwise, continue any eye drops you have been prescribed.  Talk to your primary caregiver about all other medicines that you take (both prescription and nonprescription). In some cases, you may need to stop or change medicines near the time of your surgery. This is most important if you are taking  blood-thinning medicine.Do not stop medicines unless you are told to do so.  Arrange for someone to drive you to and from the procedure.  Do not put contact lenses in either eye on the day of your surgery. PROCEDURE There is more than one method for safely removing a cataract. Your doctor can explain the differences and help determine which is best for you. Phacoemulsification surgery is the most common form of cataract surgery.  An injection is given behind the eye or eye drops are given to make this a painless procedure.  A small cut (incision) is made on the edge of the clear, dome-shaped surface that covers the front of the eye (cornea).  A tiny probe is painlessly inserted into the eye. This device gives off ultrasound waves that soften and break up the cloudy center of the lens. This makes it easier for the cloudy lens to be removed by suction.  An IOL may be implanted.  The normal lens of the eye is covered by a clear capsule. Part of that capsule is intentionally left in the eye to support the IOL.  Your surgeon may or may not use stitches to close the incision. There are other forms of cataract surgery that require a larger incision and stitches to close the eye. This approach is taken in cases where the doctor feels that the cataract cannot be easily removed using phacoemulsification. AFTER THE PROCEDURE  When an IOL is implanted, it does not need care. It becomes a permanent part of your eye and cannot be seen or felt.  Your doctor will schedule follow-up exams to check on your progress.  Review your other medicines with your doctor to see which can be resumed after surgery.  Use eye drops or take medicine as prescribed by your doctor. Document Released: 02/17/2011 Document Revised: 07/15/2013 Document Reviewed: 02/17/2011 [facility] Patient Information 2015 [place], [place]. This information is not intended to replace advice given to you by your health care provider. Make  sure you discuss any questions you have with your health care provider.

## 2013-10-31 ENCOUNTER — Encounter (HOSPITAL_COMMUNITY)
Admission: RE | Admit: 2013-10-31 | Discharge: 2013-10-31 | Disposition: A | Discharge status: Home / Self Care | Payer: Medicare Other | Source: Ambulatory Visit | Attending: Ophthalmology | Admitting: Ophthalmology

## 2013-10-31 ENCOUNTER — Encounter (HOSPITAL_COMMUNITY): Payer: Self-pay

## 2013-11-04 MED ORDER — PHENYLEPHRINE HCL 2.5 % OP SOLN
OPHTHALMIC | Status: AC
  Filled 2013-11-04: qty 15

## 2013-11-04 MED ORDER — CYCLOPENTOLATE-PHENYLEPHRINE OP SOLN OPTIME - NO CHARGE
OPHTHALMIC | Status: AC
  Filled 2013-11-04: qty 2

## 2013-11-04 MED ORDER — KETOROLAC TROMETHAMINE 0.5 % OP SOLN
OPHTHALMIC | Status: AC
  Filled 2013-11-04: qty 5

## 2013-11-04 MED ORDER — TETRACAINE HCL 0.5 % OP SOLN
OPHTHALMIC | Status: AC
Start: 2013-11-04 — End: 2013-11-04
  Filled 2013-11-04: qty 2

## 2013-11-05 ENCOUNTER — Ambulatory Visit (HOSPITAL_COMMUNITY)
Admission: RE | Admit: 2013-11-05 | Discharge: 2013-11-05 | Disposition: A | Discharge status: Home / Self Care | Payer: Medicare Other | Source: Ambulatory Visit | Attending: Ophthalmology | Admitting: Ophthalmology

## 2013-11-05 ENCOUNTER — Encounter (HOSPITAL_COMMUNITY)
Admission: RE | Disposition: A | Discharge status: Home / Self Care | Payer: Self-pay | Source: Ambulatory Visit | Attending: Ophthalmology

## 2013-11-05 ENCOUNTER — Ambulatory Visit (HOSPITAL_COMMUNITY): Payer: Medicare Other | Admitting: Anesthesiology

## 2013-11-05 ENCOUNTER — Encounter (HOSPITAL_COMMUNITY): Payer: Medicare Other | Admitting: Anesthesiology

## 2013-11-05 ENCOUNTER — Encounter (HOSPITAL_COMMUNITY): Payer: Self-pay | Admitting: *Deleted

## 2013-11-05 DIAGNOSIS — I10 Essential (primary) hypertension: Secondary | ICD-10-CM | POA: Diagnosis not present

## 2013-11-05 DIAGNOSIS — Z7901 Long term (current) use of anticoagulants: Secondary | ICD-10-CM | POA: Diagnosis not present

## 2013-11-05 DIAGNOSIS — H259 Unspecified age-related cataract: Secondary | ICD-10-CM | POA: Diagnosis not present

## 2013-11-05 DIAGNOSIS — Z87891 Personal history of nicotine dependence: Secondary | ICD-10-CM | POA: Diagnosis not present

## 2013-11-05 DIAGNOSIS — E119 Type 2 diabetes mellitus without complications: Secondary | ICD-10-CM | POA: Insufficient documentation

## 2013-11-05 DIAGNOSIS — Z79899 Other long term (current) drug therapy: Secondary | ICD-10-CM | POA: Diagnosis not present

## 2013-11-05 DIAGNOSIS — K219 Gastro-esophageal reflux disease without esophagitis: Secondary | ICD-10-CM | POA: Diagnosis not present

## 2013-11-05 DIAGNOSIS — I82409 Acute embolism and thrombosis of unspecified deep veins of unspecified lower extremity: Secondary | ICD-10-CM | POA: Diagnosis not present

## 2013-11-05 DIAGNOSIS — H2510 Age-related nuclear cataract, unspecified eye: Secondary | ICD-10-CM | POA: Insufficient documentation

## 2013-11-05 HISTORY — PX: CATARACT EXTRACTION W/PHACO: SHX586

## 2013-11-05 LAB — BASIC METABOLIC PANEL
ANION GAP: 14 (ref 5–15)
BUN: 43 mg/dL — ABNORMAL HIGH (ref 6–23)
CO2: 25 mEq/L (ref 19–32)
Calcium: 9.1 mg/dL (ref 8.4–10.5)
Chloride: 108 mEq/L (ref 96–112)
Creatinine, Ser: 4.4 mg/dL — ABNORMAL HIGH (ref 0.50–1.35)
GFR calc Af Amer: 14 mL/min — ABNORMAL LOW (ref >90)
GFR calc non Af Amer: 12 mL/min — ABNORMAL LOW (ref >90)
Glucose, Bld: 82 mg/dL (ref 70–99)
Potassium: 4.5 mEq/L (ref 3.7–5.3)
Sodium: 147 mEq/L (ref 137–147)

## 2013-11-05 LAB — HEMOGLOBIN AND HEMATOCRIT, BLOOD
HEMATOCRIT: 36.4 % — AB (ref 39.0–52.0)
Hemoglobin: 11.8 g/dL — ABNORMAL LOW (ref 13.0–17.0)

## 2013-11-05 LAB — PROTIME-INR
INR: 1.9 — ABNORMAL HIGH (ref 0.00–1.49)
Prothrombin Time: 21.8 seconds — ABNORMAL HIGH (ref 11.6–15.2)

## 2013-11-05 LAB — GLUCOSE, CAPILLARY: Glucose-Capillary: 76 mg/dL (ref 70–99)

## 2013-11-05 SURGERY — PHACOEMULSIFICATION, CATARACT, WITH IOL INSERTION
Anesthesia: Monitor Anesthesia Care | Site: Eye | Laterality: Left

## 2013-11-05 MED ORDER — TETRACAINE 0.5 % OP SOLN OPTIME - NO CHARGE
OPHTHALMIC | Status: DC | PRN
  Administered 2013-11-05: 2 [drp] via OPHTHALMIC

## 2013-11-05 MED ORDER — LACTATED RINGERS IV SOLN
INTRAVENOUS | Status: DC
  Administered 2013-11-05: 09:00:00 via INTRAVENOUS

## 2013-11-05 MED ORDER — DEXTROSE 50 % IV SOLN
12.5000 g | Freq: Once | INTRAVENOUS | Status: AC
  Administered 2013-11-05: 12.5 g via INTRAVENOUS

## 2013-11-05 MED ORDER — EPINEPHRINE HCL 1 MG/ML IJ SOLN
INTRAOCULAR | Status: DC | PRN
  Administered 2013-11-05: 09:00:00

## 2013-11-05 MED ORDER — DEXTROSE 50 % IV SOLN
INTRAVENOUS | Status: AC
  Filled 2013-11-05: qty 50

## 2013-11-05 MED ORDER — EPINEPHRINE HCL 1 MG/ML IJ SOLN
INTRAMUSCULAR | Status: AC
  Filled 2013-11-05: qty 1

## 2013-11-05 MED ORDER — MIDAZOLAM HCL 2 MG/2ML IJ SOLN
INTRAMUSCULAR | Status: AC
  Filled 2013-11-05: qty 2

## 2013-11-05 MED ORDER — KETOROLAC TROMETHAMINE 0.5 % OP SOLN
1.0000 [drp] | OPHTHALMIC | Status: AC
  Administered 2013-11-05 (×3): 1 [drp] via OPHTHALMIC

## 2013-11-05 MED ORDER — FENTANYL CITRATE 0.05 MG/ML IJ SOLN
INTRAMUSCULAR | Status: AC
  Filled 2013-11-05: qty 2

## 2013-11-05 MED ORDER — TETRACAINE HCL 0.5 % OP SOLN
1.0000 [drp] | OPHTHALMIC | Status: AC
  Administered 2013-11-05 (×3): 1 [drp] via OPHTHALMIC

## 2013-11-05 MED ORDER — PHENYLEPHRINE HCL 2.5 % OP SOLN
1.0000 [drp] | OPHTHALMIC | Status: AC
  Administered 2013-11-05 (×3): 1 [drp] via OPHTHALMIC

## 2013-11-05 MED ORDER — CYCLOPENTOLATE-PHENYLEPHRINE 0.2-1 % OP SOLN
1.0000 [drp] | OPHTHALMIC | Status: AC
  Administered 2013-11-05 (×3): 1 [drp] via OPHTHALMIC

## 2013-11-05 MED ORDER — FENTANYL CITRATE 0.05 MG/ML IJ SOLN
25.0000 ug | INTRAMUSCULAR | Status: AC
  Administered 2013-11-05: 25 ug via INTRAVENOUS

## 2013-11-05 MED ORDER — BSS IO SOLN
INTRAOCULAR | Status: DC | PRN
  Administered 2013-11-05: 15 mL via INTRAOCULAR

## 2013-11-05 MED ORDER — MIDAZOLAM HCL 2 MG/2ML IJ SOLN
1.0000 mg | INTRAMUSCULAR | Status: DC | PRN
  Administered 2013-11-05: 2 mg via INTRAVENOUS

## 2013-11-05 MED ORDER — PROVISC 10 MG/ML IO SOLN
INTRAOCULAR | Status: DC | PRN
  Administered 2013-11-05: 0.85 mL via INTRAOCULAR

## 2013-11-05 SURGICAL SUPPLY — 24 items
CAPSULAR TENSION RING-AMO (OPHTHALMIC RELATED) IMPLANT
CLOTH BEACON ORANGE TIMEOUT ST (SAFETY) ×1 IMPLANT
EYE SHIELD UNIVERSAL CLEAR (GAUZE/BANDAGES/DRESSINGS) ×1 IMPLANT
GLOVE BIO SURGEON STRL SZ 6.5 (GLOVE) ×1 IMPLANT
GLOVE ECLIPSE 6.5 STRL STRAW (GLOVE) IMPLANT
GLOVE ECLIPSE 7.0 STRL STRAW (GLOVE) IMPLANT
GLOVE EXAM NITRILE LRG STRL (GLOVE) IMPLANT
GLOVE EXAM NITRILE MD LF STRL (GLOVE) ×1 IMPLANT
GLOVE SKINSENSE NS SZ6.5 (GLOVE)
GLOVE SKINSENSE STRL SZ6.5 (GLOVE) IMPLANT
HEALON 5 0.6 ML (INTRAOCULAR LENS) IMPLANT
KIT VITRECTOMY (OPHTHALMIC RELATED) IMPLANT
PAD ARMBOARD 7.5X6 YLW CONV (MISCELLANEOUS) ×1 IMPLANT
PROC W NO LENS (INTRAOCULAR LENS)
PROC W SPEC LENS (INTRAOCULAR LENS)
PROCESS W NO LENS (INTRAOCULAR LENS) IMPLANT
PROCESS W SPEC LENS (INTRAOCULAR LENS) IMPLANT
RETRACTOR IRIS SIGHTPATH (OPHTHALMIC RELATED) ×1 IMPLANT
RING MALYGIN (MISCELLANEOUS) IMPLANT
SIGHTPATH CAT PROC W REG LENS (Ophthalmic Related) ×2 IMPLANT
TAPE SURG TRANSPORE 1 IN (GAUZE/BANDAGES/DRESSINGS) IMPLANT
TAPE SURGICAL TRANSPORE 1 IN (GAUZE/BANDAGES/DRESSINGS) ×1
VISCOELASTIC ADDITIONAL (OPHTHALMIC RELATED) IMPLANT
WATER STERILE IRR 250ML POUR (IV SOLUTION) ×1 IMPLANT

## 2013-11-05 NOTE — Anesthesia Preprocedure Evaluation (Signed)
Anesthesia Evaluation  Patient identified by MRN, date of birth, ID band Patient awake    Reviewed: Allergy & Precautions, H&P , NPO status , Patient's Chart, lab work & pertinent test results  Airway Mallampati: II TM Distance: >3 FB Neck ROM: Full    Dental no notable dental hx. (+) Teeth Intact   Pulmonary neg pulmonary ROS, shortness of breath and with exertion, former smoker,  breath sounds clear to auscultation  Pulmonary exam normal       Cardiovascular hypertension, Pt. on medications + Peripheral Vascular Disease and DVT (coumadin Rx) Rhythm:Regular Rate:Normal     Neuro/Psych negative neurological ROS  negative psych ROS   GI/Hepatic negative GI ROS, Neg liver ROS, GERD-  Medicated and Controlled,  Endo/Other  diabetes, Well Controlled, Type 2, Oral Hypoglycemic Agents  Renal/GU negative Renal ROS  negative genitourinary   Musculoskeletal negative musculoskeletal ROS (+)   Abdominal   Peds negative pediatric ROS (+)  Hematology negative hematology ROS (+)   Anesthesia Other Findings   Reproductive/Obstetrics negative OB ROS                           Anesthesia Physical Anesthesia Plan  ASA: III  Anesthesia Plan: MAC   Post-op Pain Management:    Induction: Intravenous  Airway Management Planned: Nasal Cannula  Additional Equipment:   Intra-op Plan:   Post-operative Plan:   Informed Consent: I have reviewed the patients History and Physical, chart, labs and discussed the procedure including the risks, benefits and alternatives for the proposed anesthesia with the patient or authorized representative who has indicated his/her understanding and acceptance.     Plan Discussed with:   Anesthesia Plan Comments: (1/2 amp D50 (12.5g ) for CBG=76 this am.)        Anesthesia Quick Evaluation

## 2013-11-05 NOTE — Anesthesia Postprocedure Evaluation (Signed)
  Anesthesia Post-op Note  Patient: Gregory Rangel  Procedure(s) Performed: Procedure(s) (LRB): CATARACT EXTRACTION PHACO AND INTRAOCULAR LENS PLACEMENT (IOC) (Left)  Patient Location:  Short Stay  Anesthesia Type: MAC  Level of Consciousness: awake  Airway and Oxygen Therapy: Patient Spontanous Breathing  Post-op Pain: none  Post-op Assessment: Post-op Vital signs reviewed, Patient's Cardiovascular Status Stable, Respiratory Function Stable, Patent Airway, No signs of Nausea or vomiting and Pain level controlled  Post-op Vital Signs: Reviewed and stable  Complications: No apparent anesthesia complications

## 2013-11-05 NOTE — Op Note (Signed)
Patient brought to the operating room and prepped and draped in the usual manner.  Lid speculum inserted in left eye.  Stab incision made at the twelve o'clock position.  Provisc instilled in the anterior chamber.   A 2.4 mm. Stab incision was made temporally.  An anterior capsulotomy was done with a bent 25 gauge needle.  The nucleus was hydrodissected.  The Phaco tip was inserted in the anterior chamber and the nucleus was emulsified.  CDE was 22.81.  The cortical material was then removed with the I and A tip.  Posterior capsule was the polished.  The anterior chamber was deepened with Provisc.  A 19.0 Alcon [id number]  IOL was then inserted in the capsular bag.  Provisc was then removed with the I and A tip.  The wound was then hydrated.  Patient sent to the Recovery Room in good condition with follow up in my office.  Preoperative Diagnosis:  Nuclear Cataract OS Postoperative Diagnosis:  Same Procedure name: Kelman Phacoemulsification OS with IOL

## 2013-11-05 NOTE — H&P (Signed)
The patient was re examined and there is no change in the patients condition since the original H and P. 

## 2013-11-05 NOTE — Transfer of Care (Signed)
Immediate Anesthesia Transfer of Care Note  Patient: Gregory Rangel  Procedure(s) Performed: Procedure(s) (LRB): CATARACT EXTRACTION PHACO AND INTRAOCULAR LENS PLACEMENT (IOC) (Left)  Patient Location: Shortstay  Anesthesia Type: MAC  Level of Consciousness: awake  Airway & Oxygen Therapy: Patient Spontanous Breathing   Post-op Assessment: Report given to PACU RN, Post -op Vital signs reviewed and stable and Patient moving all extremities  Post vital signs: Reviewed and stable  Complications: No apparent anesthesia complications

## 2013-11-05 NOTE — Anesthesia Procedure Notes (Signed)
Procedure Name: MAC Date/Time: 11/05/2013 8:43 AM Performed by: Vista Deck Pre-anesthesia Checklist: Patient identified, Emergency Drugs available, Suction available, Timeout performed and Patient being monitored Patient Re-evaluated:Patient Re-evaluated prior to inductionOxygen Delivery Method: Nasal Cannula

## 2013-11-05 NOTE — Discharge Instructions (Signed)
CONNER MUEGGE  11/05/2013           [facility] Instructions [place] [address]      1. Avoid closing eyes tightly. One often closes the eye tightly when laughing, talking, sneezing, coughing or if they feel irritated. At these times, you should be careful not to close your eyes tightly.  2. Instill eye drops as instructed. To instill drops in your eye, open it, look up and have someone gently pull the lower lid down and instill a couple of drops inside the lower lid.  3. Do not touch upper lid.  4. Take Advil or Tylenol for pain.  5. You may use either eye for near work, such as reading or sewing and you may watch television.  6. You may have your hair done at the beauty parlor at any time.  7. Wear dark glasses with or without your own glasses if you are in bright light.  8. Call our office at [phone] or [phone] if you have sharp pain in your eye or unusual symptoms.  9. Do not be concerned because vision in the operative eye is not good. It will not be good, no matter how successful the operation, until you get a special lens for it. Your old glasses will not be suited to the new eye that was operated on and you will not be ready for a new lens for about a month.  10. Follow up at the [facility] office.    I have received a copy of the above instructions and will follow them.   PATIENT INSTRUCTIONS POST-ANESTHESIA  IMMEDIATELY FOLLOWING SURGERY:  Do not drive or operate machinery for the first twenty four hours after surgery.  Do not make any important decisions for twenty four hours after surgery or while taking narcotic pain medications or sedatives.  If you develop intractable nausea and vomiting or a severe headache please notify your doctor immediately.  FOLLOW-UP:  Please make an appointment with your surgeon as instructed. You do not need to follow up with anesthesia unless specifically instructed to do  so.  WOUND CARE INSTRUCTIONS (if applicable):  Keep a dry clean dressing on the anesthesia/puncture wound site if there is drainage.  Once the wound has quit draining you may leave it open to air.  Generally you should leave the bandage intact for twenty four hours unless there is drainage.  If the epidural site drains for more than 36-48 hours please call the anesthesia department.  QUESTIONS?:  Please feel free to call your physician or the hospital operator if you have any questions, and they will be happy to assist you.

## 2013-11-06 ENCOUNTER — Encounter (HOSPITAL_COMMUNITY): Payer: Self-pay | Admitting: Ophthalmology

## 2013-11-06 LAB — GLUCOSE, CAPILLARY: Glucose-Capillary: 108 mg/dL — ABNORMAL HIGH (ref 70–99)

## 2013-11-26 DIAGNOSIS — Z7901 Long term (current) use of anticoagulants: Secondary | ICD-10-CM | POA: Diagnosis not present

## 2013-12-12 DIAGNOSIS — Z7901 Long term (current) use of anticoagulants: Secondary | ICD-10-CM | POA: Diagnosis not present

## 2013-12-27 ENCOUNTER — Other Ambulatory Visit (INDEPENDENT_AMBULATORY_CARE_PROVIDER_SITE_OTHER): Payer: Self-pay | Admitting: Internal Medicine

## 2013-12-27 DIAGNOSIS — B351 Tinea unguium: Secondary | ICD-10-CM | POA: Diagnosis not present

## 2013-12-27 DIAGNOSIS — L851 Acquired keratosis [keratoderma] palmaris et plantaris: Secondary | ICD-10-CM | POA: Diagnosis not present

## 2013-12-27 DIAGNOSIS — E1142 Type 2 diabetes mellitus with diabetic polyneuropathy: Secondary | ICD-10-CM | POA: Diagnosis not present

## 2013-12-30 NOTE — Telephone Encounter (Signed)
Per Dr.Rehman states that the patient will need to have a office appointment prior to March 2016

## 2014-01-02 DIAGNOSIS — I10 Essential (primary) hypertension: Secondary | ICD-10-CM | POA: Diagnosis not present

## 2014-01-02 DIAGNOSIS — R809 Proteinuria, unspecified: Secondary | ICD-10-CM | POA: Diagnosis not present

## 2014-01-02 DIAGNOSIS — D649 Anemia, unspecified: Secondary | ICD-10-CM | POA: Diagnosis not present

## 2014-01-02 DIAGNOSIS — Z79899 Other long term (current) drug therapy: Secondary | ICD-10-CM | POA: Diagnosis not present

## 2014-01-02 DIAGNOSIS — N183 Chronic kidney disease, stage 3 (moderate): Secondary | ICD-10-CM | POA: Diagnosis not present

## 2014-01-02 DIAGNOSIS — E559 Vitamin D deficiency, unspecified: Secondary | ICD-10-CM | POA: Diagnosis not present

## 2014-01-06 DIAGNOSIS — D631 Anemia in chronic kidney disease: Secondary | ICD-10-CM | POA: Diagnosis not present

## 2014-01-06 DIAGNOSIS — E1129 Type 2 diabetes mellitus with other diabetic kidney complication: Secondary | ICD-10-CM | POA: Diagnosis not present

## 2014-01-06 DIAGNOSIS — I10 Essential (primary) hypertension: Secondary | ICD-10-CM | POA: Diagnosis not present

## 2014-01-06 DIAGNOSIS — R809 Proteinuria, unspecified: Secondary | ICD-10-CM | POA: Diagnosis not present

## 2014-01-06 DIAGNOSIS — N184 Chronic kidney disease, stage 4 (severe): Secondary | ICD-10-CM | POA: Diagnosis not present

## 2014-01-09 ENCOUNTER — Encounter (INDEPENDENT_AMBULATORY_CARE_PROVIDER_SITE_OTHER): Payer: Self-pay | Admitting: *Deleted

## 2014-01-09 NOTE — Telephone Encounter (Signed)
Apt has been scheduled for 04/28/14 with Dr. Laural Golden.

## 2014-01-16 DIAGNOSIS — Z23 Encounter for immunization: Secondary | ICD-10-CM | POA: Diagnosis not present

## 2014-01-21 ENCOUNTER — Other Ambulatory Visit (HOSPITAL_COMMUNITY): Payer: Self-pay | Admitting: Pulmonary Disease

## 2014-01-21 DIAGNOSIS — R609 Edema, unspecified: Secondary | ICD-10-CM

## 2014-01-21 DIAGNOSIS — M79605 Pain in left leg: Secondary | ICD-10-CM

## 2014-01-21 DIAGNOSIS — E1140 Type 2 diabetes mellitus with diabetic neuropathy, unspecified: Secondary | ICD-10-CM | POA: Diagnosis not present

## 2014-01-21 DIAGNOSIS — E1121 Type 2 diabetes mellitus with diabetic nephropathy: Secondary | ICD-10-CM | POA: Diagnosis not present

## 2014-01-21 DIAGNOSIS — M179 Osteoarthritis of knee, unspecified: Secondary | ICD-10-CM | POA: Diagnosis not present

## 2014-01-21 DIAGNOSIS — M79604 Pain in right leg: Secondary | ICD-10-CM

## 2014-01-21 DIAGNOSIS — I129 Hypertensive chronic kidney disease with stage 1 through stage 4 chronic kidney disease, or unspecified chronic kidney disease: Secondary | ICD-10-CM | POA: Diagnosis not present

## 2014-01-22 ENCOUNTER — Ambulatory Visit (HOSPITAL_COMMUNITY)
Admission: RE | Admit: 2014-01-22 | Discharge: 2014-01-22 | Disposition: A | Discharge status: Home / Self Care | Payer: Medicare Other | Source: Ambulatory Visit | Attending: Pulmonary Disease | Admitting: Pulmonary Disease

## 2014-01-22 DIAGNOSIS — R609 Edema, unspecified: Secondary | ICD-10-CM | POA: Insufficient documentation

## 2014-01-22 DIAGNOSIS — M79604 Pain in right leg: Secondary | ICD-10-CM | POA: Insufficient documentation

## 2014-01-22 DIAGNOSIS — M7989 Other specified soft tissue disorders: Secondary | ICD-10-CM | POA: Diagnosis not present

## 2014-01-22 DIAGNOSIS — M79605 Pain in left leg: Secondary | ICD-10-CM | POA: Diagnosis not present

## 2014-01-29 DIAGNOSIS — L570 Actinic keratosis: Secondary | ICD-10-CM | POA: Diagnosis not present

## 2014-01-29 DIAGNOSIS — Z85828 Personal history of other malignant neoplasm of skin: Secondary | ICD-10-CM | POA: Diagnosis not present

## 2014-03-18 DIAGNOSIS — Z961 Presence of intraocular lens: Secondary | ICD-10-CM | POA: Diagnosis not present

## 2014-03-21 DIAGNOSIS — E1142 Type 2 diabetes mellitus with diabetic polyneuropathy: Secondary | ICD-10-CM | POA: Diagnosis not present

## 2014-03-21 DIAGNOSIS — B351 Tinea unguium: Secondary | ICD-10-CM | POA: Diagnosis not present

## 2014-03-21 DIAGNOSIS — L851 Acquired keratosis [keratoderma] palmaris et plantaris: Secondary | ICD-10-CM | POA: Diagnosis not present

## 2014-04-02 DIAGNOSIS — N183 Chronic kidney disease, stage 3 (moderate): Secondary | ICD-10-CM | POA: Diagnosis not present

## 2014-04-02 DIAGNOSIS — R809 Proteinuria, unspecified: Secondary | ICD-10-CM | POA: Diagnosis not present

## 2014-04-02 DIAGNOSIS — E559 Vitamin D deficiency, unspecified: Secondary | ICD-10-CM | POA: Diagnosis not present

## 2014-04-02 DIAGNOSIS — I10 Essential (primary) hypertension: Secondary | ICD-10-CM | POA: Diagnosis not present

## 2014-04-02 DIAGNOSIS — D649 Anemia, unspecified: Secondary | ICD-10-CM | POA: Diagnosis not present

## 2014-04-02 DIAGNOSIS — Z79899 Other long term (current) drug therapy: Secondary | ICD-10-CM | POA: Diagnosis not present

## 2014-04-07 DIAGNOSIS — N185 Chronic kidney disease, stage 5: Secondary | ICD-10-CM | POA: Diagnosis not present

## 2014-04-07 DIAGNOSIS — I10 Essential (primary) hypertension: Secondary | ICD-10-CM | POA: Diagnosis not present

## 2014-04-07 DIAGNOSIS — R809 Proteinuria, unspecified: Secondary | ICD-10-CM | POA: Diagnosis not present

## 2014-04-07 DIAGNOSIS — E1129 Type 2 diabetes mellitus with other diabetic kidney complication: Secondary | ICD-10-CM | POA: Diagnosis not present

## 2014-04-22 ENCOUNTER — Other Ambulatory Visit: Payer: Self-pay | Admitting: *Deleted

## 2014-04-22 DIAGNOSIS — Z01810 Encounter for preprocedural cardiovascular examination: Secondary | ICD-10-CM

## 2014-04-22 DIAGNOSIS — H43812 Vitreous degeneration, left eye: Secondary | ICD-10-CM | POA: Diagnosis not present

## 2014-04-22 DIAGNOSIS — N184 Chronic kidney disease, stage 4 (severe): Secondary | ICD-10-CM

## 2014-04-23 DIAGNOSIS — M545 Low back pain: Secondary | ICD-10-CM | POA: Diagnosis not present

## 2014-04-23 DIAGNOSIS — I129 Hypertensive chronic kidney disease with stage 1 through stage 4 chronic kidney disease, or unspecified chronic kidney disease: Secondary | ICD-10-CM | POA: Diagnosis not present

## 2014-04-23 DIAGNOSIS — M179 Osteoarthritis of knee, unspecified: Secondary | ICD-10-CM | POA: Diagnosis not present

## 2014-04-23 DIAGNOSIS — E1121 Type 2 diabetes mellitus with diabetic nephropathy: Secondary | ICD-10-CM | POA: Diagnosis not present

## 2014-04-28 ENCOUNTER — Ambulatory Visit (INDEPENDENT_AMBULATORY_CARE_PROVIDER_SITE_OTHER): Payer: Medicare Other | Admitting: Internal Medicine

## 2014-05-20 ENCOUNTER — Encounter: Payer: Self-pay | Admitting: Vascular Surgery

## 2014-05-21 ENCOUNTER — Ambulatory Visit (INDEPENDENT_AMBULATORY_CARE_PROVIDER_SITE_OTHER): Payer: Medicare Other | Admitting: Vascular Surgery

## 2014-05-21 ENCOUNTER — Ambulatory Visit (INDEPENDENT_AMBULATORY_CARE_PROVIDER_SITE_OTHER)
Admission: RE | Admit: 2014-05-21 | Discharge: 2014-05-21 | Disposition: A | Discharge status: Home / Self Care | Payer: Medicare Other | Source: Ambulatory Visit | Attending: Vascular Surgery | Admitting: Vascular Surgery

## 2014-05-21 ENCOUNTER — Encounter: Payer: Self-pay | Admitting: Vascular Surgery

## 2014-05-21 ENCOUNTER — Encounter (INDEPENDENT_AMBULATORY_CARE_PROVIDER_SITE_OTHER): Payer: Self-pay

## 2014-05-21 ENCOUNTER — Ambulatory Visit (HOSPITAL_COMMUNITY)
Admission: RE | Admit: 2014-05-21 | Discharge: 2014-05-21 | Disposition: A | Discharge status: Home / Self Care | Payer: Medicare Other | Source: Ambulatory Visit | Attending: Vascular Surgery | Admitting: Vascular Surgery

## 2014-05-21 VITALS — BP 160/65 | HR 76 | Temp 98.0°F | Resp 16 | Ht 69.0 in | Wt 236.0 lb

## 2014-05-21 DIAGNOSIS — Z01810 Encounter for preprocedural cardiovascular examination: Secondary | ICD-10-CM | POA: Diagnosis not present

## 2014-05-21 DIAGNOSIS — N184 Chronic kidney disease, stage 4 (severe): Secondary | ICD-10-CM

## 2014-05-21 NOTE — Progress Notes (Signed)
[facility]  Patient name: Gregory Rangel MRN: [id number] DOB: [birth year] Sex: male  REASON FOR CONSULT: Evaluate for hemodialysis access. Referred by Dr. Lowanda Foster.  HPI: Gregory Rangel is a 81 y.o. male who is not yet on dialysis. He is right-handed. I believe his end-stage renal disease secondary to diabetes and hypertension. He denies any recent uremic symptoms. Specifically he denies nausea, vomiting, fatigue, anorexia, or palpitations.  In reviewing his records it looks like he has undergone previous ablation of the right greater saphenous vein in February 2015. Follow up study showed a good result.   Past Medical History  Diagnosis Date  . Hypertension   . Shortness of breath     with exertion  . Diabetes mellitus without complication   . GERD (gastroesophageal reflux disease)   . Arthritis   . Cancer     colon cancer  . DVT (deep venous thrombosis)   . Varicose veins    No family history on file. SOCIAL HISTORY: History  Substance Use Topics  . Smoking status: Former Smoker    Quit date: 06/28/1983  . Smokeless tobacco: Former Systems developer  . Alcohol Use: No   Allergies  Allergen Reactions  . Penicillins Hives, Swelling and Rash   Current Outpatient Prescriptions  Medication Sig Dispense Refill  . acetaminophen (TYLENOL) 500 MG tablet Take 1,000 mg by mouth every 6 (six) hours as needed for mild pain, fever or headache.    . ALPRAZolam (XANAX) 0.5 MG tablet Take 0.5 mg by mouth at bedtime as needed for sleep.    Marland Kitchen amLODipine (NORVASC) 10 MG tablet Take 10 mg by mouth daily.     . benazepril (LOTENSIN) 40 MG tablet Take 40 mg by mouth daily.     Marland Kitchen glipiZIDE (GLUCOTROL XL) 10 MG 24 hr tablet Take 10 mg by mouth every morning.    . iron polysaccharides (NIFEREX) 150 MG capsule Take 150 mg by mouth daily.    . pantoprazole (PROTONIX) 40 MG tablet TAKE ONE TABLET BY MOUTH ONCE DAILY. 30 tablet 5  . sodium bicarbonate 650 MG tablet Take 650  mg by mouth 2 (two) times daily.    Marland Kitchen warfarin (COUMADIN) 5 MG tablet Take 5 mg by mouth daily.     No current facility-administered medications for this visit.   REVIEW OF SYSTEMS: Valu.Nieves ] denotes positive finding; [  ] denotes negative finding  CARDIOVASCULAR:  [ ]  chest pain   [ ]  chest pressure   [ ]  palpitations   [ ]  orthopnea   [ ]  dyspnea on exertion   [ ]  claudication   [ ]  rest pain   Valu.Nieves ] DVT   [ ]  phlebitis PULMONARY:   [ ]  productive cough   [ ]  asthma   [ ]  wheezing NEUROLOGIC:   [ ]  weakness  [ ]  paresthesias  [ ]  aphasia  [ ]  amaurosis  [ ]  dizziness HEMATOLOGIC:   [ ]  bleeding problems   [ ]  clotting disorders MUSCULOSKELETAL:  [ ]  joint pain   [ ]  joint swelling [ ]  leg swelling GASTROINTESTINAL: [ ]   blood in stool  [ ]   hematemesis GENITOURINARY:  [ ]   dysuria  [ ]   hematuria PSYCHIATRIC:  [ ]  history of major depression INTEGUMENTARY:  [ ]  rashes  [ ]  ulcers CONSTITUTIONAL:  [ ]  fever   [ ]  chills  PHYSICAL EXAM: Filed Vitals:   05/21/14 1439  BP: 160/65  Pulse: 76  Temp: 98 F (36.7 C)  TempSrc: Oral  Resp: 16  Height: 5\' 9"  (1.753 m)  Weight: 236 lb (107.049 kg)  SpO2: 100%   Body mass index is 34.84 kg/(m^2). GENERAL: The patient is a well-nourished male, in no acute distress. The vital signs are documented above. CARDIOVASCULAR: There is a regular rate and rhythm. I do not detect carotid bruits. He has palpable radial pulses bilaterally. PULMONARY: There is good air exchange bilaterally without wheezing or rales. ABDOMEN: Soft and non-tender with normal pitched bowel sounds.  MUSCULOSKELETAL: There are no major deformities or cyanosis. NEUROLOGIC: No focal weakness or paresthesias are detected. SKIN: There are no ulcers or rashes noted. PSYCHIATRIC: The patient has a normal affect.  DATA:  I have independently interpreted his upper extremity vein mapping. On the left side, the forearm and upper arm cephalic vein do not appear adequate. The basilic vein  looks potentially adequate. On the right side the forearm cephalic vein looks small. The upper arm cephalic vein might potentially be usable. The basilic vein on the right is fairly short but has reasonable sizes.  I have independently interpreted his arterial Doppler study which shows triphasic radial and ulnar signals bilaterally with a normal Allen's test.  His GFR on 11/05/2013 was 12.  MEDICAL ISSUES:  STAGE IV CHRONIC KIDNEY DISEASE: He is right handed and would prefer to have access in the left arm. He might potentially be a candidate for a left basilic vein transposition. If this were not adequate and we would place an AV graft given his GFR is less than 15. I have explained the indications for placement of an AV fistula or AV graft. I've explained that if at all possible we will place an AV fistula.  I have reviewed the risks of placement of an AV fistula including but not limited to: failure of the fistula to mature, need for subsequent interventions, and thrombosis. In addition I have reviewed the potential complications of placement of an AV graft. These risks include, but are not limited to, graft thrombosis, graft infection, wound healing problems, bleeding, arm swelling, and steal syndrome. All the patient's questions were answered and they are agreeable to proceed with surgery. His surgery is scheduled for 06/10/2014. He did not want to proceed earlier than that because of his schedule.  [facility]: [phone]

## 2014-05-22 ENCOUNTER — Other Ambulatory Visit: Payer: Self-pay | Admitting: *Deleted

## 2014-05-22 NOTE — Addendum Note (Signed)
Addended by: Mena Goes on: 05/22/2014 09:23 AM   Modules accepted: Orders, Medications

## 2014-05-26 ENCOUNTER — Ambulatory Visit (INDEPENDENT_AMBULATORY_CARE_PROVIDER_SITE_OTHER): Payer: Medicare Other | Admitting: Internal Medicine

## 2014-05-26 ENCOUNTER — Encounter (INDEPENDENT_AMBULATORY_CARE_PROVIDER_SITE_OTHER): Payer: Self-pay | Admitting: Internal Medicine

## 2014-05-26 VITALS — BP 130/80 | HR 72 | Temp 97.8°F | Resp 18 | Ht 72.0 in | Wt 232.4 lb

## 2014-05-26 DIAGNOSIS — D509 Iron deficiency anemia, unspecified: Secondary | ICD-10-CM

## 2014-05-26 DIAGNOSIS — Z85038 Personal history of other malignant neoplasm of large intestine: Secondary | ICD-10-CM

## 2014-05-26 DIAGNOSIS — K2100 Gastro-esophageal reflux disease with esophagitis, without bleeding: Secondary | ICD-10-CM

## 2014-05-26 NOTE — Progress Notes (Signed)
Presenting complaint;  Evaluation for Iron deficiency anemia.  History of present illness;  Patient is 81 year old Caucasian male who has history of GERD and CRC who was noted have low serum iron saturation and low normal ferritin levels by his nephrologist Dr. Hinda Lenis therefore sent over for GI evaluation. He was last seen in May 2014 when he was having swallowing difficulty. He underwent EGD with dilation of distal esophageal stricture. He has occasional dysphagia to solids and food quickly passes down with few sips of water. He does not feel that his esophagus needs to be dilated at the present time. He feels heartburn is well controlled with PPI. He denies melena or rectal bleeding or hematuria. His stools have been dark since he was begun on iron over a month ago. He has good appetite. He has gained 6 pounds since his last visit 22 months ago. He has chronic back pain as well as pain in both his knees. He uses cane to move around. He cannot walk long distance. He is scheduled to undergo arteriovenous graft or basilic vein transposition by Dr. Deitra Mayo on 06/10/2014.  Current Medications: Outpatient Encounter Prescriptions as of 05/26/2014  Medication Sig  . acetaminophen (TYLENOL) 500 MG tablet Take 1,000 mg by mouth every 6 (six) hours as needed for mild pain, fever or headache.  . ALPRAZolam (XANAX) 0.5 MG tablet Take 0.5 mg by mouth at bedtime as needed for sleep.  Marland Kitchen amLODipine (NORVASC) 10 MG tablet Take 10 mg by mouth daily.   . benazepril (LOTENSIN) 40 MG tablet Take 40 mg by mouth daily.   Marland Kitchen glipiZIDE (GLUCOTROL XL) 10 MG 24 hr tablet Take 10 mg by mouth every morning.  . iron polysaccharides (NIFEREX) 150 MG capsule Take 150 mg by mouth daily.  . pantoprazole (PROTONIX) 40 MG tablet TAKE ONE TABLET BY MOUTH ONCE DAILY.  . sodium bicarbonate 650 MG tablet Take 650 mg by mouth 2 (two) times daily.    Past Medical History  Diagnosis Date  . Hypertension   . Shortness  of breath     with exertion  . Diabetes mellitus without complication   . GERD (gastroesophageal reflux disease)   . Arthritis   . Cancer     colon cancer  . DVT (deep venous thrombosis)   . Varicose veins    Past Surgical History  Procedure Laterality Date  . Back surgery      x 2  . Tonsillectomy    . Appendectomy    . Cholecystectomy    . Eye surgery      right eye with lens implant  . Right knee arthroscopy    . Total knee arthroplasty Right 07/06/2012    Procedure: RIGHT TOTAL KNEE ARTHROPLASTY;  Surgeon: Johnn Hai, MD;  Location: [facility];  Service: Orthopedics;  Laterality: Right;  . Esophagogastroduodenoscopy (egd) with esophageal dilation N/A 08/09/2012    Procedure: ESOPHAGOGASTRODUODENOSCOPY (EGD) WITH ESOPHAGEAL DILATION;  Surgeon: Rogene Houston, MD;  Location: [facility] SUITE;  Service: Endoscopy;  Laterality: N/A;  155-moved to 830 Ann to notify pt  . Colon surgery    . Endovenous ablation saphenous vein w/ laser Right 04-25-2013    right greater saphenous vein Curt Jews MD  . Cataract extraction w/phaco Left 11/05/2013    Procedure: CATARACT EXTRACTION PHACO AND INTRAOCULAR LENS PLACEMENT ([place]);  Surgeon: Elta Guadeloupe T. Gershon Crane, MD;  Location: [facility] ORS;  Service: Ophthalmology;  Laterality: Left;  CDE: 22.81      Objective: Blood pressure  130/80, pulse 72, temperature 97.8 F (36.6 C), temperature source Oral, resp. rate 18, height 6' (1.829 m), weight 232 lb 6.4 oz (105.416 kg). Patient is alert and in no acute distress. Conjunctiva is pink. Sclera is nonicteric Oropharyngeal mucosa is normal. Few teeth are missing and remaining in satisfactory condition. No neck masses or thyromegaly noted. Cardiac exam with regular rhythm normal S1 and S2. Faint systolic ejection murmur noted at left upper sternal border and aortic area. Lungs are clear to auscultation. Abdomen is full but soft and nontender without organomegaly or masses. Rectal examination deferred.  No LE  edema or clubbing noted.  Labs/studies Results: Lab data from 04/02/2014  H&H 11.6 and 35.7  Serum iron 39, TIBC 277 and saturation 14%  Serum ferritin 31  BUN 48 and creatinine 4.63  Serum calcium 9.5  Serum sodium 144 potassium 4.7, chloride 106, CO2 21 and albumin 4.0    Assessment:  #1. Iron deficiency anemia. No evidence of overt GI bleed. He has history of CRC with right hemicolectomy in 1998 and last colonoscopy was in April 2009 with removal of two small polyps; these polyps were non-adenomatous. It remains to be seen if he has occult GI bleed on impaired auto option. #2. Chronic GERD complicated by distal esophageal stricture last dilated in May 2014. Heartburn well controlled and he has sporadic dysphagia. #3. History of CRC. Last colonoscopy in April 2009 and he is long overdue for surveillance colonoscopy.   Plan:  Hemoccult 1. Colonoscopy will be delayed until he has had AV graft which is planned for later this month. Will need to stop iron for 10 days prior to colonoscopy.

## 2014-05-26 NOTE — Patient Instructions (Signed)
Hemoccult 1. Colonoscopy be scheduled in about 8 weeks.

## 2014-05-27 ENCOUNTER — Telehealth (INDEPENDENT_AMBULATORY_CARE_PROVIDER_SITE_OTHER): Payer: Self-pay | Admitting: *Deleted

## 2014-05-27 NOTE — Telephone Encounter (Signed)
   Diagnosis:    Result(s)   Card 1: Positive:            Completed by: Carr Shartzer,LPN   HEMOCCULT SENSA DEVELOPER: LOT#:  [id number] EXPIRATION DATE: 09/17   HEMOCCULT SENSA CARD:  LOT#:  02/14 EXPIRATION DATE: 07/18   CARD CONTROL RESULTS:  POSITIVE: Positive NEGATIVE: Negative    ADDITIONAL COMMENTS: Forwarded to [place] for review.

## 2014-05-28 NOTE — Telephone Encounter (Signed)
Stool is guaiac positive. Will also proceed with EGD at the time of colonoscopy.

## 2014-05-28 NOTE — Pre-Procedure Instructions (Signed)
Gregory Rangel  05/28/2014   Your procedure is scheduled on: Tuesday, June 10, 2014 at 7:30 AM  Report to [facility] Admitting at 5:30 AM.  Call this number if you have problems the morning of surgery: [phone]   Remember:   Do not eat food or drink liquids after midnight Monday, June 09, 2014   Take these medicines the morning of surgery with A SIP OF WATER: amLODipine (NORVASC), pantoprazole (PROTONIX), if needed: acetaminophen (TYLENOL) for pain, fever or headache.  DO NOT TAKE ANY DIABETIC MEDICATION THE MORNING OF PROCEDURE such as glipiZIDE (GLUCOTROL XL)  Stop taking vitamins and herbal medications. Do not take any NSAIDs ie: Ibuprofen, Advil, Naproxen and etc.; stop 1 week prior to procedure ( Monday, June 02, 2014 )   Do not wear jewelry, make-up or nail polish.  Do not wear lotions, powders, or perfumes. You may not wear deodorant.  Do not shave 48 hours prior to surgery. Men may shave face and neck.  Do not bring valuables to the hospital.  [facility] is not responsible for any belongings or valuables.               Contacts, dentures or bridgework may not be worn into surgery.  Leave suitcase in the car. After surgery it may be brought to your room.  For patients admitted to the hospital, discharge time is determined by your treatment team.               Patients discharged the day of surgery will not be allowed to drive home.  Name and phone number of your driver:   Special Instructions:  Special Instructions:Special Instructions: [facility] - Preparing for Surgery  Before surgery, you can play an important role.  Because skin is not sterile, your skin needs to be as free of germs as possible.  You can reduce the number of germs on you skin by washing with CHG (chlorahexidine gluconate) soap before surgery.  CHG is an antiseptic cleaner which kills germs and bonds with the skin to continue killing germs even after washing.  Please DO NOT use if you  have an allergy to CHG or antibacterial soaps.  If your skin becomes reddened/irritated stop using the CHG and inform your nurse when you arrive at Short Stay.  Do not shave (including legs and underarms) for at least 48 hours prior to the first CHG shower.  You may shave your face.  Please follow these instructions carefully:   1.  Shower with CHG Soap the night before surgery and the morning of Surgery.  2.  If you choose to wash your hair, wash your hair first as usual with your normal shampoo.  3.  After you shampoo, rinse your hair and body thoroughly to remove the Shampoo.  4.  Use CHG as you would any other liquid soap.  You can apply chg directly  to the skin and wash gently with scrungie or a clean washcloth.  5.  Apply the CHG Soap to your body ONLY FROM THE NECK DOWN.  Do not use on open wounds or open sores.  Avoid contact with your eyes, ears, mouth and genitals (private parts).  Wash genitals (private parts) with your normal soap.  6.  Wash thoroughly, paying special attention to the area where your surgery will be performed.  7.  Thoroughly rinse your body with warm water from the neck down.  8.  DO NOT shower/wash with your normal soap after  using and rinsing off the CHG Soap.  9.  Pat yourself dry with a clean towel.            10.  Wear clean pajamas.            11.  Place clean sheets on your bed the night of your first shower and do not sleep with pets.  Day of Surgery  Do not apply any lotions/deodorants the morning of surgery.  Please wear clean clothes to the hospital/surgery center.   Please read over the following fact sheets that you were given: Pain Booklet, Coughing and Deep Breathing and Surgical Site Infection Prevention

## 2014-05-29 ENCOUNTER — Encounter (HOSPITAL_COMMUNITY)
Admission: RE | Admit: 2014-05-29 | Discharge: 2014-05-29 | Disposition: A | Discharge status: Home / Self Care | Payer: Medicare Other | Source: Ambulatory Visit | Attending: Vascular Surgery | Admitting: Vascular Surgery

## 2014-05-29 ENCOUNTER — Encounter (HOSPITAL_COMMUNITY): Payer: Self-pay

## 2014-05-29 DIAGNOSIS — Z01812 Encounter for preprocedural laboratory examination: Secondary | ICD-10-CM | POA: Diagnosis not present

## 2014-05-29 DIAGNOSIS — N184 Chronic kidney disease, stage 4 (severe): Secondary | ICD-10-CM | POA: Diagnosis not present

## 2014-05-29 HISTORY — DX: Anemia, unspecified: D64.9

## 2014-05-29 HISTORY — DX: Chronic kidney disease, unspecified: N18.9

## 2014-05-29 LAB — CBC
HCT: 36 % — ABNORMAL LOW (ref 39.0–52.0)
Hemoglobin: 11.4 g/dL — ABNORMAL LOW (ref 13.0–17.0)
MCH: 27.8 pg (ref 26.0–34.0)
MCHC: 31.7 g/dL (ref 30.0–36.0)
MCV: 87.8 fL (ref 78.0–100.0)
PLATELETS: 251 10*3/uL (ref 150–400)
RBC: 4.1 MIL/uL — AB (ref 4.22–5.81)
RDW: 15.5 % (ref 11.5–15.5)
WBC: 9.8 10*3/uL (ref 4.0–10.5)

## 2014-05-29 LAB — BASIC METABOLIC PANEL
Anion gap: 8 (ref 5–15)
BUN: 57 mg/dL — AB (ref 6–23)
CHLORIDE: 113 mmol/L — AB (ref 96–112)
CO2: 22 mmol/L (ref 19–32)
Calcium: 9.4 mg/dL (ref 8.4–10.5)
Creatinine, Ser: 5.89 mg/dL — ABNORMAL HIGH (ref 0.50–1.35)
GFR calc Af Amer: 10 mL/min — ABNORMAL LOW (ref >90)
GFR, EST NON AFRICAN AMERICAN: 8 mL/min — AB (ref >90)
GLUCOSE: 123 mg/dL — AB (ref 70–99)
Potassium: 4.3 mmol/L (ref 3.5–5.1)
SODIUM: 143 mmol/L (ref 135–145)

## 2014-05-29 NOTE — Progress Notes (Signed)
PCP is Dr Sinda Du. Denies seeing a cardiologist. Denies ever having a card cath or stress test Echo noted in epic from 12-19-2009 EKG noted in epic from 10-31-2013 Pt reports his fasting cbg's run 90-130 on average.

## 2014-05-29 NOTE — Progress Notes (Signed)
Anesthesia Chart Review:  Pt is 81 year old male scheduled for L basilic vein transposition vs arteriovenous graft on 06/10/2014 with Dr. Scot Dock.   PMH includes: HTN, DM, ESRD, DVT, GERD. Former smoker. BMI 31.   Preoperative labs reviewed.  Cr 5.89. Pt has ESRD and will undergo procedure to establish dialysis access.   EKG 10/31/2013: NSR. Minimal voltage criteria for LVH, may be normal variant.   Echo 12/19/2009: Left ventricle: The cavity size was normal. There was mild concentric hypertrophy. Systolic function was normal. Th estimated ejection fraction was in the range of 55% to 60%. Regional wall motion abnormalities cannot be excluded.   If no changes, I anticipate pt can proceed with surgery as scheduled.   Willeen Cass, FNP-BC [facility] Short Stay Surgical Center/Anesthesiology Phone: [phone] 05/29/2014 5:00 PM

## 2014-05-30 DIAGNOSIS — L851 Acquired keratosis [keratoderma] palmaris et plantaris: Secondary | ICD-10-CM | POA: Diagnosis not present

## 2014-05-30 DIAGNOSIS — M79671 Pain in right foot: Secondary | ICD-10-CM | POA: Diagnosis not present

## 2014-05-30 DIAGNOSIS — B351 Tinea unguium: Secondary | ICD-10-CM | POA: Diagnosis not present

## 2014-05-30 DIAGNOSIS — E1142 Type 2 diabetes mellitus with diabetic polyneuropathy: Secondary | ICD-10-CM | POA: Diagnosis not present

## 2014-06-02 NOTE — Telephone Encounter (Signed)
EGD added to TCS recall for 07/2014 -- spoke to patient and he wants me to ail him a letter in April as a reminder and he will call me to schedule once he is better from the AV graft

## 2014-06-03 ENCOUNTER — Encounter (INDEPENDENT_AMBULATORY_CARE_PROVIDER_SITE_OTHER): Payer: Self-pay

## 2014-06-09 MED ORDER — VANCOMYCIN HCL 10 G IV SOLR
1500.0000 mg | INTRAVENOUS | Status: AC
  Administered 2014-06-10: 1500 mg via INTRAVENOUS
  Filled 2014-06-09: qty 1500

## 2014-06-09 MED ORDER — CHLORHEXIDINE GLUCONATE CLOTH 2 % EX PADS: 6.0000 | MEDICATED_PAD | Freq: Once | CUTANEOUS | Status: DC

## 2014-06-09 MED ORDER — SODIUM CHLORIDE 0.9 % IV SOLN
INTRAVENOUS | Status: DC
  Administered 2014-06-10 (×2): via INTRAVENOUS

## 2014-06-09 NOTE — Progress Notes (Signed)
Patient notified to arrive at 08:30

## 2014-06-10 ENCOUNTER — Ambulatory Visit (HOSPITAL_COMMUNITY): Payer: Medicare Other | Admitting: Anesthesiology

## 2014-06-10 ENCOUNTER — Ambulatory Visit (HOSPITAL_COMMUNITY): Payer: Medicare Other | Admitting: Emergency Medicine

## 2014-06-10 ENCOUNTER — Ambulatory Visit (HOSPITAL_COMMUNITY)
Admission: RE | Admit: 2014-06-10 | Discharge: 2014-06-10 | Disposition: A | Discharge status: Home / Self Care | Payer: Medicare Other | Source: Ambulatory Visit | Attending: Vascular Surgery | Admitting: Vascular Surgery

## 2014-06-10 ENCOUNTER — Encounter (HOSPITAL_COMMUNITY)
Admission: RE | Disposition: A | Discharge status: Home / Self Care | Payer: Self-pay | Source: Ambulatory Visit | Attending: Vascular Surgery

## 2014-06-10 ENCOUNTER — Encounter (HOSPITAL_COMMUNITY): Payer: Self-pay | Admitting: *Deleted

## 2014-06-10 ENCOUNTER — Other Ambulatory Visit: Payer: Medicare Other | Admitting: *Deleted

## 2014-06-10 DIAGNOSIS — K219 Gastro-esophageal reflux disease without esophagitis: Secondary | ICD-10-CM | POA: Diagnosis not present

## 2014-06-10 DIAGNOSIS — N186 End stage renal disease: Secondary | ICD-10-CM | POA: Insufficient documentation

## 2014-06-10 DIAGNOSIS — I739 Peripheral vascular disease, unspecified: Secondary | ICD-10-CM | POA: Diagnosis not present

## 2014-06-10 DIAGNOSIS — Z4931 Encounter for adequacy testing for hemodialysis: Secondary | ICD-10-CM

## 2014-06-10 DIAGNOSIS — Z86718 Personal history of other venous thrombosis and embolism: Secondary | ICD-10-CM | POA: Insufficient documentation

## 2014-06-10 DIAGNOSIS — I120 Hypertensive chronic kidney disease with stage 5 chronic kidney disease or end stage renal disease: Secondary | ICD-10-CM | POA: Insufficient documentation

## 2014-06-10 DIAGNOSIS — D649 Anemia, unspecified: Secondary | ICD-10-CM | POA: Insufficient documentation

## 2014-06-10 DIAGNOSIS — Z7901 Long term (current) use of anticoagulants: Secondary | ICD-10-CM | POA: Insufficient documentation

## 2014-06-10 DIAGNOSIS — Z85038 Personal history of other malignant neoplasm of large intestine: Secondary | ICD-10-CM | POA: Diagnosis not present

## 2014-06-10 DIAGNOSIS — E119 Type 2 diabetes mellitus without complications: Secondary | ICD-10-CM | POA: Diagnosis not present

## 2014-06-10 DIAGNOSIS — M1990 Unspecified osteoarthritis, unspecified site: Secondary | ICD-10-CM | POA: Insufficient documentation

## 2014-06-10 DIAGNOSIS — N184 Chronic kidney disease, stage 4 (severe): Secondary | ICD-10-CM | POA: Diagnosis not present

## 2014-06-10 DIAGNOSIS — Z87891 Personal history of nicotine dependence: Secondary | ICD-10-CM | POA: Diagnosis not present

## 2014-06-10 DIAGNOSIS — N189 Chronic kidney disease, unspecified: Secondary | ICD-10-CM | POA: Diagnosis not present

## 2014-06-10 HISTORY — PX: BASCILIC VEIN TRANSPOSITION: SHX5742

## 2014-06-10 LAB — GLUCOSE, CAPILLARY
GLUCOSE-CAPILLARY: 111 mg/dL — AB (ref 70–99)
Glucose-Capillary: 91 mg/dL (ref 70–99)

## 2014-06-10 LAB — POCT I-STAT 4, (NA,K, GLUC, HGB,HCT)
Glucose, Bld: 108 mg/dL — ABNORMAL HIGH (ref 70–99)
HEMATOCRIT: 34 % — AB (ref 39.0–52.0)
HEMOGLOBIN: 11.6 g/dL — AB (ref 13.0–17.0)
POTASSIUM: 4.3 mmol/L (ref 3.5–5.1)
SODIUM: 145 mmol/L (ref 135–145)

## 2014-06-10 SURGERY — TRANSPOSITION, VEIN, BASILIC
Anesthesia: Monitor Anesthesia Care | Site: Arm Upper | Laterality: Left

## 2014-06-10 MED ORDER — PROTAMINE SULFATE 10 MG/ML IV SOLN
INTRAVENOUS | Status: DC | PRN
  Administered 2014-06-10: 20 mg via INTRAVENOUS
  Administered 2014-06-10 (×2): 10 mg via INTRAVENOUS

## 2014-06-10 MED ORDER — LIDOCAINE HCL (PF) 1 % IJ SOLN
INTRAMUSCULAR | Status: AC
  Filled 2014-06-10: qty 30

## 2014-06-10 MED ORDER — OXYCODONE HCL 5 MG PO TABS: 5.0000 mg | ORAL_TABLET | Freq: Four times a day (QID) | ORAL | Status: DC | PRN

## 2014-06-10 MED ORDER — PROPOFOL 10 MG/ML IV BOLUS
INTRAVENOUS | Status: AC
  Filled 2014-06-10: qty 20

## 2014-06-10 MED ORDER — FENTANYL CITRATE 0.05 MG/ML IJ SOLN
25.0000 ug | INTRAMUSCULAR | Status: DC | PRN
  Administered 2014-06-10 (×4): 25 ug via INTRAVENOUS

## 2014-06-10 MED ORDER — SODIUM CHLORIDE 0.9 % IR SOLN
Status: DC | PRN
  Administered 2014-06-10: 500 mL

## 2014-06-10 MED ORDER — FENTANYL CITRATE 0.05 MG/ML IJ SOLN
INTRAMUSCULAR | Status: AC
  Filled 2014-06-10: qty 5

## 2014-06-10 MED ORDER — LIDOCAINE HCL (CARDIAC) 20 MG/ML IV SOLN
INTRAVENOUS | Status: AC
  Filled 2014-06-10: qty 5

## 2014-06-10 MED ORDER — PAPAVERINE HCL 30 MG/ML IJ SOLN
INTRAMUSCULAR | Status: AC
  Filled 2014-06-10: qty 2

## 2014-06-10 MED ORDER — HEPARIN SODIUM (PORCINE) 1000 UNIT/ML IJ SOLN
INTRAMUSCULAR | Status: AC
  Filled 2014-06-10: qty 1

## 2014-06-10 MED ORDER — LIDOCAINE HCL (CARDIAC) 20 MG/ML IV SOLN
INTRAVENOUS | Status: DC | PRN
  Administered 2014-06-10: 60 mg via INTRAVENOUS

## 2014-06-10 MED ORDER — THROMBIN 20000 UNITS EX SOLR
CUTANEOUS | Status: AC
  Filled 2014-06-10: qty 20000

## 2014-06-10 MED ORDER — ROCURONIUM BROMIDE 50 MG/5ML IV SOLN
INTRAVENOUS | Status: AC
  Filled 2014-06-10: qty 1

## 2014-06-10 MED ORDER — SUCCINYLCHOLINE CHLORIDE 20 MG/ML IJ SOLN
INTRAMUSCULAR | Status: AC
  Filled 2014-06-10: qty 1

## 2014-06-10 MED ORDER — HEPARIN SODIUM (PORCINE) 1000 UNIT/ML IJ SOLN
INTRAMUSCULAR | Status: DC | PRN
  Administered 2014-06-10: 7000 [IU] via INTRAVENOUS

## 2014-06-10 MED ORDER — PROTAMINE SULFATE 10 MG/ML IV SOLN
INTRAVENOUS | Status: AC
  Filled 2014-06-10: qty 5

## 2014-06-10 MED ORDER — 0.9 % SODIUM CHLORIDE (POUR BTL) OPTIME
TOPICAL | Status: DC | PRN
  Administered 2014-06-10: 1000 mL

## 2014-06-10 MED ORDER — PROMETHAZINE HCL 25 MG/ML IJ SOLN: 6.2500 mg | INTRAMUSCULAR | Status: DC | PRN

## 2014-06-10 MED ORDER — PROPOFOL 10 MG/ML IV BOLUS
INTRAVENOUS | Status: DC | PRN
  Administered 2014-06-10: 150 mg via INTRAVENOUS

## 2014-06-10 MED ORDER — PHENYLEPHRINE HCL 10 MG/ML IJ SOLN
INTRAMUSCULAR | Status: DC | PRN
  Administered 2014-06-10 (×2): 80 ug via INTRAVENOUS
  Administered 2014-06-10: 40 ug via INTRAVENOUS
  Administered 2014-06-10: 80 ug via INTRAVENOUS
  Administered 2014-06-10: 40 ug via INTRAVENOUS

## 2014-06-10 MED ORDER — OXYCODONE HCL 5 MG PO TABS
5.0000 mg | ORAL_TABLET | Freq: Once | ORAL | Status: AC
  Administered 2014-06-10: 5 mg via ORAL

## 2014-06-10 MED ORDER — FENTANYL CITRATE 0.05 MG/ML IJ SOLN
INTRAMUSCULAR | Status: DC | PRN
  Administered 2014-06-10: 100 ug via INTRAVENOUS
  Administered 2014-06-10: 50 ug via INTRAVENOUS
  Administered 2014-06-10: 25 ug via INTRAVENOUS
  Administered 2014-06-10: 50 ug via INTRAVENOUS
  Administered 2014-06-10: 25 ug via INTRAVENOUS

## 2014-06-10 MED ORDER — PHENYLEPHRINE 40 MCG/ML (10ML) SYRINGE FOR IV PUSH (FOR BLOOD PRESSURE SUPPORT)
PREFILLED_SYRINGE | INTRAVENOUS | Status: AC
  Filled 2014-06-10: qty 10

## 2014-06-10 MED ORDER — FENTANYL CITRATE 0.05 MG/ML IJ SOLN
INTRAMUSCULAR | Status: AC
  Filled 2014-06-10: qty 2

## 2014-06-10 MED ORDER — OXYCODONE HCL 5 MG PO TABS
ORAL_TABLET | ORAL | Status: AC
  Filled 2014-06-10: qty 1

## 2014-06-10 SURGICAL SUPPLY — 35 items
CANISTER SUCTION 2500CC (MISCELLANEOUS) ×3 IMPLANT
CANNULA VESSEL 3MM 2 BLNT TIP (CANNULA) ×2 IMPLANT
CLIP TI MEDIUM 24 (CLIP) ×3 IMPLANT
CLIP TI WIDE RED SMALL 24 (CLIP) ×3 IMPLANT
COVER PROBE W GEL 5X96 (DRAPES) ×3 IMPLANT
DECANTER SPIKE VIAL GLASS SM (MISCELLANEOUS) ×3 IMPLANT
DRAIN PENROSE 1/2X12 LTX STRL (WOUND CARE) IMPLANT
ELECT REM PT RETURN 9FT ADLT (ELECTROSURGICAL) ×3
ELECTRODE REM PT RTRN 9FT ADLT (ELECTROSURGICAL) ×1 IMPLANT
GLOVE BIO SURGEON STRL SZ 6.5 (GLOVE) ×2 IMPLANT
GLOVE BIO SURGEON STRL SZ7.5 (GLOVE) ×5 IMPLANT
GLOVE BIO SURGEONS STRL SZ 6.5 (GLOVE) ×2
GLOVE BIOGEL PI IND STRL 6.5 (GLOVE) IMPLANT
GLOVE BIOGEL PI IND STRL 7.0 (GLOVE) IMPLANT
GLOVE BIOGEL PI IND STRL 8 (GLOVE) ×1 IMPLANT
GLOVE BIOGEL PI INDICATOR 6.5 (GLOVE) ×4
GLOVE BIOGEL PI INDICATOR 7.0 (GLOVE) ×2
GLOVE BIOGEL PI INDICATOR 8 (GLOVE) ×2
GLOVE SURG SS PI 7.0 STRL IVOR (GLOVE) ×4 IMPLANT
GOWN STRL REUS W/ TWL LRG LVL3 (GOWN DISPOSABLE) ×3 IMPLANT
GOWN STRL REUS W/TWL LRG LVL3 (GOWN DISPOSABLE) ×12
KIT BASIN OR (CUSTOM PROCEDURE TRAY) ×3 IMPLANT
KIT ROOM TURNOVER OR (KITS) ×3 IMPLANT
LIQUID BAND (GAUZE/BANDAGES/DRESSINGS) ×3 IMPLANT
NS IRRIG 1000ML POUR BTL (IV SOLUTION) ×3 IMPLANT
PACK CV ACCESS (CUSTOM PROCEDURE TRAY) ×3 IMPLANT
PAD ARMBOARD 7.5X6 YLW CONV (MISCELLANEOUS) ×6 IMPLANT
SPONGE SURGIFOAM ABS GEL 100 (HEMOSTASIS) IMPLANT
SUT PROLENE 6 0 BV (SUTURE) ×3 IMPLANT
SUT SILK 2 0 SH (SUTURE) ×5 IMPLANT
SUT VIC AB 3-0 SH 27 (SUTURE) ×3
SUT VIC AB 3-0 SH 27X BRD (SUTURE) ×1 IMPLANT
SUT VICRYL 4-0 PS2 18IN ABS (SUTURE) ×5 IMPLANT
UNDERPAD 30X30 INCONTINENT (UNDERPADS AND DIAPERS) ×3 IMPLANT
WATER STERILE IRR 1000ML POUR (IV SOLUTION) ×3 IMPLANT

## 2014-06-10 NOTE — Anesthesia Postprocedure Evaluation (Signed)
  Anesthesia Post-op Note  Patient: Gregory Rangel  Procedure(s) Performed: Procedure(s) (LRB): LEFT FIRST STAGE BASILIC VEIN TRANSPOSITION  (Left)  Patient Location: PACU  Anesthesia Type: General  Level of Consciousness: awake and alert   Airway and Oxygen Therapy: Patient Spontanous Breathing  Post-op Pain: mild  Post-op Assessment: Post-op Vital signs reviewed, Patient's Cardiovascular Status Stable, Respiratory Function Stable, Patent Airway and No signs of Nausea or vomiting  Last Vitals:  Filed Vitals:   06/10/14 1458  BP:   Pulse:   Temp: 36.7 C  Resp:     Post-op Vital Signs: stable   Complications: No apparent anesthesia complications

## 2014-06-10 NOTE — Anesthesia Preprocedure Evaluation (Addendum)
Anesthesia Evaluation  Patient identified by MRN, date of birth, ID band Patient awake    Reviewed: Allergy & Precautions, NPO status , Patient's Chart, lab work & pertinent test results  Airway Mallampati: II  TM Distance: >3 FB Neck ROM: Full    Dental  (+) Chipped, Dental Advisory Given   Pulmonary neg pulmonary ROS, shortness of breath, former smoker,  breath sounds clear to auscultation  Pulmonary exam normal       Cardiovascular hypertension, Pt. on medications + Peripheral Vascular Disease and DVT Rhythm:Regular Rate:Normal     Neuro/Psych negative neurological ROS  negative psych ROS   GI/Hepatic negative GI ROS, Neg liver ROS, GERD-  ,  Endo/Other  diabetes, Well Controlled, Type 2, Oral Hypoglycemic Agents  Renal/GU ESRFRenal disease  negative genitourinary   Musculoskeletal negative musculoskeletal ROS (+) Arthritis -,   Abdominal (+)  Abdomen: soft.    Peds negative pediatric ROS (+)  Hematology  (+) anemia ,   Anesthesia Other Findings   Reproductive/Obstetrics negative OB ROS                         Anesthesia Physical Anesthesia Plan  ASA: III  Anesthesia Plan: MAC   Post-op Pain Management:    Induction: Intravenous  Airway Management Planned: Simple Face Mask  Additional Equipment:   Intra-op Plan:   Post-operative Plan:   Informed Consent: I have reviewed the patients History and Physical, chart, labs and discussed the procedure including the risks, benefits and alternatives for the proposed anesthesia with the patient or authorized representative who has indicated his/her understanding and acceptance.   Dental advisory given  Plan Discussed with: CRNA and Surgeon  Anesthesia Plan Comments:         Anesthesia Quick Evaluation

## 2014-06-10 NOTE — Discharge Instructions (Signed)
° ° °  06/10/2014 [facility] SPECIALE [id number] Sep 01, 1933  Surgeon(s): Angelia Mould, MD  Procedure(s): LEFT FIRST STAGE BASILIC VEIN TRANSPOSITION   x Do not stick fistula for 12 weeks

## 2014-06-10 NOTE — Op Note (Signed)
    NAME: Gregory Rangel   MRN: [id number] DOB: 11-Dec-1933    DATE OF OPERATION: 06/10/2014  PREOP DIAGNOSIS: Stage IV chronic kidney disease  POSTOP DIAGNOSIS: Same  PROCEDURE: Left basilic vein transposition  SURGEON: Judeth Cornfield. Scot Dock, MD, FACS  ASSIST: Leontine Locket, PA  ANESTHESIA: Gen.   EBL: minimal  INDICATIONS: Gregory Rangel is a 81 y.o. male who is not yet on dialysis. He presents for new access.  FINDINGS: 4.5 mm basilic vein.  TECHNIQUE: The patient was taken to the operating room and received a general anesthetic. The left upper extremity was prepped and draped in usual sterile fashion. Using 2 incisions along the medial aspect of the left arm the basilic vein was harvested from the antecubital level up to the axilla. Through the distal incision the brachial artery was dissected free. A tunnel was created from this incision to the axilla and in the vein which had been marked to prevent twisting was brought through the tunnel. All branches had been divided between clips and 3-0 silk ties. The patient was then heparinized. The brachial artery was clamped proximally and distally and a longitudinal arteriotomy was made. The vein was cut the appropriate length, spatulated, and sewn end-to-side to the brachial artery using continuous 6-0 Prolene suture. At the completion was a good thrill in the fistula. It was a good radial and ulnar signal with the Doppler. Hemostasis was obtained in the wounds and the heparin was partially reversed with protamine. The wounds were closed with deep 3-0 Vicryl and the skin closed with 4-0 Vicryl. Dermabond was applied. The patient tolerated the procedure well and was transferred to the recovery room in stable condition.  Deitra Mayo, MD, FACS [facility]  DATE OF DICTATION:   06/10/2014

## 2014-06-10 NOTE — Transfer of Care (Signed)
Immediate Anesthesia Transfer of Care Note  Patient: Gregory Rangel  Procedure(s) Performed: Procedure(s): LEFT FIRST STAGE BASILIC VEIN TRANSPOSITION  (Left)  Patient Location: PACU  Anesthesia Type:General  Level of Consciousness: awake, alert  and oriented  Airway & Oxygen Therapy: Patient connected to face mask oxygen  Post-op Assessment: Post -op Vital signs reviewed and stable  Post vital signs: stable  Last Vitals:  Filed Vitals:   06/10/14 0840  BP: 170/75  Pulse: 71  Temp: 37.1 C  Resp: 18    Complications: No apparent anesthesia complications

## 2014-06-10 NOTE — Interval H&P Note (Signed)
History and Physical Interval Note:  06/10/2014 10:42 AM  Gregory Rangel  has presented today for surgery, with the diagnosis of Chronic kidney disease  The various methods of treatment have been discussed with the patient and family. After consideration of risks, benefits and other options for treatment, the patient has consented to  Procedure(s): BASILIC VEIN TRANSPOSITION VS ARTERIOVENOUS GRAFT (Left) as a surgical intervention .  The patient's history has been reviewed, patient examined, no change in status, stable for surgery.  I have reviewed the patient's chart and labs.  Questions were answered to the patient's satisfaction.     DICKSON,CHRISTOPHER S

## 2014-06-10 NOTE — Progress Notes (Signed)
Scratched noted to left lower arm with some broken skin noted and rash like area noted after clippers were used.

## 2014-06-10 NOTE — Anesthesia Procedure Notes (Signed)
Procedure Name: LMA Insertion Date/Time: 06/10/2014 10:58 AM Performed by: Maeola Harman Pre-anesthesia Checklist: Patient identified, Emergency Drugs available, Suction available, Patient being monitored and Timeout performed Patient Re-evaluated:Patient Re-evaluated prior to inductionOxygen Delivery Method: Circle system utilized Preoxygenation: Pre-oxygenation with 100% oxygen Intubation Type: IV induction Ventilation: Mask ventilation without difficulty LMA: LMA inserted LMA Size: 5.0 Number of attempts: 1 Placement Confirmation: positive ETCO2 and breath sounds checked- equal and bilateral Tube secured with: Tape Dental Injury: Teeth and Oropharynx as per pre-operative assessment

## 2014-06-10 NOTE — H&P (View-Only) (Signed)
[facility] [place]  Patient name: Gregory Rangel MRN: [id number] DOB: Nov 14, 1933 Sex: male  REASON FOR CONSULT: Evaluate for hemodialysis access. Referred by Dr. Lowanda Foster.  HPI: Gregory Rangel is a 81 y.o. male who is not yet on dialysis. He is right-handed. I believe his end-stage renal disease secondary to diabetes and hypertension. He denies any recent uremic symptoms. Specifically he denies nausea, vomiting, fatigue, anorexia, or palpitations.  In reviewing his records it looks like he has undergone previous ablation of the right greater saphenous vein in February 2015. Follow up study showed a good result.   Past Medical History  Diagnosis Date  . Hypertension   . Shortness of breath     with exertion  . Diabetes mellitus without complication   . GERD (gastroesophageal reflux disease)   . Arthritis   . Cancer     colon cancer  . DVT (deep venous thrombosis)   . Varicose veins    No family history on file. SOCIAL HISTORY: History  Substance Use Topics  . Smoking status: Former Smoker    Quit date: 06/28/1983  . Smokeless tobacco: Former Systems developer  . Alcohol Use: No   Allergies  Allergen Reactions  . Penicillins Hives, Swelling and Rash   Current Outpatient Prescriptions  Medication Sig Dispense Refill  . acetaminophen (TYLENOL) 500 MG tablet Take 1,000 mg by mouth every 6 (six) hours as needed for mild pain, fever or headache.    . ALPRAZolam (XANAX) 0.5 MG tablet Take 0.5 mg by mouth at bedtime as needed for sleep.    Marland Kitchen amLODipine (NORVASC) 10 MG tablet Take 10 mg by mouth daily.     . benazepril (LOTENSIN) 40 MG tablet Take 40 mg by mouth daily.     Marland Kitchen glipiZIDE (GLUCOTROL XL) 10 MG 24 hr tablet Take 10 mg by mouth every morning.    . iron polysaccharides (NIFEREX) 150 MG capsule Take 150 mg by mouth daily.    . pantoprazole (PROTONIX) 40 MG tablet TAKE ONE TABLET BY MOUTH ONCE DAILY. 30 tablet 5  . sodium bicarbonate 650 MG tablet Take 650  mg by mouth 2 (two) times daily.    Marland Kitchen warfarin (COUMADIN) 5 MG tablet Take 5 mg by mouth daily.     No current facility-administered medications for this visit.   REVIEW OF SYSTEMS: Valu.Nieves ] denotes positive finding; [  ] denotes negative finding  CARDIOVASCULAR:  [ ]  chest pain   [ ]  chest pressure   [ ]  palpitations   [ ]  orthopnea   [ ]  dyspnea on exertion   [ ]  claudication   [ ]  rest pain   Valu.Nieves ] DVT   [ ]  phlebitis PULMONARY:   [ ]  productive cough   [ ]  asthma   [ ]  wheezing NEUROLOGIC:   [ ]  weakness  [ ]  paresthesias  [ ]  aphasia  [ ]  amaurosis  [ ]  dizziness HEMATOLOGIC:   [ ]  bleeding problems   [ ]  clotting disorders MUSCULOSKELETAL:  [ ]  joint pain   [ ]  joint swelling [ ]  leg swelling GASTROINTESTINAL: [ ]   blood in stool  [ ]   hematemesis GENITOURINARY:  [ ]   dysuria  [ ]   hematuria PSYCHIATRIC:  [ ]  history of major depression INTEGUMENTARY:  [ ]  rashes  [ ]  ulcers CONSTITUTIONAL:  [ ]  fever   [ ]  chills  PHYSICAL EXAM: Filed Vitals:   05/21/14 1439  BP: 160/65  Pulse: 76  Temp: 98 F (36.7 C)  TempSrc: Oral  Resp: 16  Height: 5\' 9"  (1.753 m)  Weight: 236 lb (107.049 kg)  SpO2: 100%   Body mass index is 34.84 kg/(m^2). GENERAL: The patient is a well-nourished male, in no acute distress. The vital signs are documented above. CARDIOVASCULAR: There is a regular rate and rhythm. I do not detect carotid bruits. He has palpable radial pulses bilaterally. PULMONARY: There is good air exchange bilaterally without wheezing or rales. ABDOMEN: Soft and non-tender with normal pitched bowel sounds.  MUSCULOSKELETAL: There are no major deformities or cyanosis. NEUROLOGIC: No focal weakness or paresthesias are detected. SKIN: There are no ulcers or rashes noted. PSYCHIATRIC: The patient has a normal affect.  DATA:  I have independently interpreted his upper extremity vein mapping. On the left side, the forearm and upper arm cephalic vein do not appear adequate. The basilic vein  looks potentially adequate. On the right side the forearm cephalic vein looks small. The upper arm cephalic vein might potentially be usable. The basilic vein on the right is fairly short but has reasonable sizes.  I have independently interpreted his arterial Doppler study which shows triphasic radial and ulnar signals bilaterally with a normal Allen's test.  His GFR on 11/05/2013 was 12.  MEDICAL ISSUES:  STAGE IV CHRONIC KIDNEY DISEASE: He is right handed and would prefer to have access in the left arm. He might potentially be a candidate for a left basilic vein transposition. If this were not adequate and we would place an AV graft given his GFR is less than 15. I have explained the indications for placement of an AV fistula or AV graft. I've explained that if at all possible we will place an AV fistula.  I have reviewed the risks of placement of an AV fistula including but not limited to: failure of the fistula to mature, need for subsequent interventions, and thrombosis. In addition I have reviewed the potential complications of placement of an AV graft. These risks include, but are not limited to, graft thrombosis, graft infection, wound healing problems, bleeding, arm swelling, and steal syndrome. All the patient's questions were answered and they are agreeable to proceed with surgery. His surgery is scheduled for 06/10/2014. He did not want to proceed earlier than that because of his schedule.  [facility] [place] [facility]: [phone]

## 2014-06-11 ENCOUNTER — Telehealth: Payer: Self-pay | Admitting: Vascular Surgery

## 2014-06-11 ENCOUNTER — Encounter (HOSPITAL_COMMUNITY): Payer: Self-pay | Admitting: Vascular Surgery

## 2014-06-11 LAB — GLUCOSE, CAPILLARY: GLUCOSE-CAPILLARY: 86 mg/dL (ref 70–99)

## 2014-06-11 NOTE — Telephone Encounter (Signed)
-----   Message from Mena Goes, RN sent at 06/10/2014  1:33 PM EDT ----- Regarding: Schedule   ----- Message -----    From: Gabriel Earing, PA-C    Sent: 06/10/2014  12:42 PM      To: [facility] Charge Pool  S/p left BVT 06/10/14.  F/u with Dr. Scot Dock in 6 weeks.  Thanks, Aldona Bar

## 2014-06-11 NOTE — Telephone Encounter (Signed)
Spoke with patient to schedule appointment, dpm °

## 2014-06-30 ENCOUNTER — Other Ambulatory Visit (INDEPENDENT_AMBULATORY_CARE_PROVIDER_SITE_OTHER): Payer: Self-pay | Admitting: Internal Medicine

## 2014-07-03 DIAGNOSIS — E559 Vitamin D deficiency, unspecified: Secondary | ICD-10-CM | POA: Diagnosis not present

## 2014-07-03 DIAGNOSIS — D509 Iron deficiency anemia, unspecified: Secondary | ICD-10-CM | POA: Diagnosis not present

## 2014-07-03 DIAGNOSIS — R809 Proteinuria, unspecified: Secondary | ICD-10-CM | POA: Diagnosis not present

## 2014-07-03 DIAGNOSIS — Z79899 Other long term (current) drug therapy: Secondary | ICD-10-CM | POA: Diagnosis not present

## 2014-07-03 DIAGNOSIS — I10 Essential (primary) hypertension: Secondary | ICD-10-CM | POA: Diagnosis not present

## 2014-07-03 DIAGNOSIS — D649 Anemia, unspecified: Secondary | ICD-10-CM | POA: Diagnosis not present

## 2014-07-03 DIAGNOSIS — N183 Chronic kidney disease, stage 3 (moderate): Secondary | ICD-10-CM | POA: Diagnosis not present

## 2014-07-07 DIAGNOSIS — I10 Essential (primary) hypertension: Secondary | ICD-10-CM | POA: Diagnosis not present

## 2014-07-07 DIAGNOSIS — I509 Heart failure, unspecified: Secondary | ICD-10-CM | POA: Diagnosis not present

## 2014-07-07 DIAGNOSIS — E872 Acidosis: Secondary | ICD-10-CM | POA: Diagnosis not present

## 2014-07-07 DIAGNOSIS — R809 Proteinuria, unspecified: Secondary | ICD-10-CM | POA: Diagnosis not present

## 2014-07-07 DIAGNOSIS — E1129 Type 2 diabetes mellitus with other diabetic kidney complication: Secondary | ICD-10-CM | POA: Diagnosis not present

## 2014-07-07 DIAGNOSIS — N185 Chronic kidney disease, stage 5: Secondary | ICD-10-CM | POA: Diagnosis not present

## 2014-07-08 DIAGNOSIS — M1712 Unilateral primary osteoarthritis, left knee: Secondary | ICD-10-CM | POA: Diagnosis not present

## 2014-07-08 DIAGNOSIS — M25562 Pain in left knee: Secondary | ICD-10-CM | POA: Diagnosis not present

## 2014-07-15 ENCOUNTER — Encounter (INDEPENDENT_AMBULATORY_CARE_PROVIDER_SITE_OTHER): Payer: Self-pay | Admitting: *Deleted

## 2014-07-21 DIAGNOSIS — E1129 Type 2 diabetes mellitus with other diabetic kidney complication: Secondary | ICD-10-CM | POA: Diagnosis not present

## 2014-07-21 DIAGNOSIS — N184 Chronic kidney disease, stage 4 (severe): Secondary | ICD-10-CM | POA: Diagnosis not present

## 2014-07-21 DIAGNOSIS — D631 Anemia in chronic kidney disease: Secondary | ICD-10-CM | POA: Diagnosis not present

## 2014-07-22 DIAGNOSIS — I10 Essential (primary) hypertension: Secondary | ICD-10-CM | POA: Diagnosis not present

## 2014-07-22 DIAGNOSIS — I129 Hypertensive chronic kidney disease with stage 1 through stage 4 chronic kidney disease, or unspecified chronic kidney disease: Secondary | ICD-10-CM | POA: Diagnosis not present

## 2014-07-22 DIAGNOSIS — M179 Osteoarthritis of knee, unspecified: Secondary | ICD-10-CM | POA: Diagnosis not present

## 2014-07-22 DIAGNOSIS — E1121 Type 2 diabetes mellitus with diabetic nephropathy: Secondary | ICD-10-CM | POA: Diagnosis not present

## 2014-07-28 ENCOUNTER — Encounter: Payer: Self-pay | Admitting: Vascular Surgery

## 2014-07-29 ENCOUNTER — Other Ambulatory Visit: Payer: Self-pay | Admitting: Vascular Surgery

## 2014-07-29 DIAGNOSIS — Z4931 Encounter for adequacy testing for hemodialysis: Secondary | ICD-10-CM

## 2014-07-29 DIAGNOSIS — N186 End stage renal disease: Secondary | ICD-10-CM

## 2014-07-30 ENCOUNTER — Ambulatory Visit (HOSPITAL_COMMUNITY)
Admission: RE | Admit: 2014-07-30 | Discharge: 2014-07-30 | Disposition: A | Discharge status: Home / Self Care | Payer: Medicare Other | Source: Ambulatory Visit | Attending: Vascular Surgery | Admitting: Vascular Surgery

## 2014-07-30 ENCOUNTER — Encounter: Payer: Self-pay | Admitting: Vascular Surgery

## 2014-07-30 ENCOUNTER — Ambulatory Visit (INDEPENDENT_AMBULATORY_CARE_PROVIDER_SITE_OTHER): Payer: Self-pay | Admitting: Vascular Surgery

## 2014-07-30 VITALS — BP 131/62 | HR 76 | Ht 72.0 in | Wt 225.0 lb

## 2014-07-30 DIAGNOSIS — N186 End stage renal disease: Secondary | ICD-10-CM

## 2014-07-30 DIAGNOSIS — Z4931 Encounter for adequacy testing for hemodialysis: Secondary | ICD-10-CM | POA: Diagnosis not present

## 2014-07-30 NOTE — Progress Notes (Signed)
   Patient name: Gregory Rangel MRN: [id number] DOB: [birth year] Sex: male  REASON FOR VISIT: Follow up after basilic vein transposition.  HPI: Gregory Rangel is a 81 y.o. male who is not yet on dialysis. He underwent a left basilic vein transposition on 06/10/2014. He comes in for a 6 week follow up visit. He has no specific complaints. He denies pain or paresthesias in the left arm.  Current Outpatient Prescriptions  Medication Sig Dispense Refill  . acetaminophen (TYLENOL) 500 MG tablet Take 1,000 mg by mouth every 6 (six) hours as needed for mild pain, fever or headache.    . ALPRAZolam (XANAX) 0.5 MG tablet Take 0.5 mg by mouth at bedtime as needed for sleep.    Marland Kitchen amLODipine (NORVASC) 10 MG tablet Take 10 mg by mouth daily.     . benazepril (LOTENSIN) 40 MG tablet Take 40 mg by mouth daily.     . calcitRIOL (ROCALTROL) 0.5 MCG capsule Take 0.5 mcg by mouth daily.    Marland Kitchen glipiZIDE (GLUCOTROL XL) 10 MG 24 hr tablet Take 10 mg by mouth every morning.    . iron polysaccharides (NIFEREX) 150 MG capsule Take 150 mg by mouth daily.    Marland Kitchen oxyCODONE (ROXICODONE) 5 MG immediate release tablet Take 1 tablet (5 mg total) by mouth every 6 (six) hours as needed. 30 tablet 0  . pantoprazole (PROTONIX) 40 MG tablet TAKE ONE TABLET BY MOUTH ONCE DAILY. 30 tablet 5  . sodium bicarbonate 650 MG tablet Take 650 mg by mouth 2 (two) times daily.     No current facility-administered medications for this visit.   REVIEW OF SYSTEMS: Valu.Nieves ] denotes positive finding; [  ] denotes negative finding  CARDIOVASCULAR:  [ ]  chest pain   [ ]  dyspnea on exertion    CONSTITUTIONAL:  [ ]  fever   [ ]  chills  PHYSICAL EXAM: Filed Vitals:   07/30/14 1128  BP: 131/62  Pulse: 76  Height: 6' (1.829 m)  Weight: 225 lb (102.059 kg)  SpO2: 100%   GENERAL: The patient is a well-nourished male, in no acute distress. The vital signs are documented above. CARDIOVASCULAR: There is a regular rate and rhythm. PULMONARY: There is  good air exchange bilaterally without wheezing or rales. He has left basilic vein transposition has an excellent thrill. He has a palpable left radial pulse.  I have independently interpreted the duplex of his fistula today which shows that the diameters of the fistula ranged from 0.54-0.65 cm. That's are very reasonable.  MEDICAL ISSUES: His left basilic vein transposition is healing nicely. It appears to be maturing nicely. This should be ready for use and a proximally 6 weeks. I'll see him back as needed.  Deitra Mayo [facility] [place]: [phone]

## 2014-08-05 ENCOUNTER — Encounter: Payer: Self-pay | Admitting: Nephrology

## 2014-08-05 DIAGNOSIS — E1129 Type 2 diabetes mellitus with other diabetic kidney complication: Secondary | ICD-10-CM | POA: Diagnosis not present

## 2014-08-05 DIAGNOSIS — I509 Heart failure, unspecified: Secondary | ICD-10-CM | POA: Diagnosis not present

## 2014-08-05 DIAGNOSIS — E872 Acidosis: Secondary | ICD-10-CM | POA: Diagnosis not present

## 2014-08-05 DIAGNOSIS — I10 Essential (primary) hypertension: Secondary | ICD-10-CM | POA: Diagnosis not present

## 2014-08-05 DIAGNOSIS — R809 Proteinuria, unspecified: Secondary | ICD-10-CM | POA: Diagnosis not present

## 2014-08-05 DIAGNOSIS — N185 Chronic kidney disease, stage 5: Secondary | ICD-10-CM | POA: Diagnosis not present

## 2014-08-15 DIAGNOSIS — E1142 Type 2 diabetes mellitus with diabetic polyneuropathy: Secondary | ICD-10-CM | POA: Diagnosis not present

## 2014-08-15 DIAGNOSIS — B351 Tinea unguium: Secondary | ICD-10-CM | POA: Diagnosis not present

## 2014-08-15 DIAGNOSIS — L851 Acquired keratosis [keratoderma] palmaris et plantaris: Secondary | ICD-10-CM | POA: Diagnosis not present

## 2014-09-04 DIAGNOSIS — N184 Chronic kidney disease, stage 4 (severe): Secondary | ICD-10-CM | POA: Diagnosis not present

## 2014-09-04 DIAGNOSIS — Z79899 Other long term (current) drug therapy: Secondary | ICD-10-CM | POA: Diagnosis not present

## 2014-09-04 DIAGNOSIS — I10 Essential (primary) hypertension: Secondary | ICD-10-CM | POA: Diagnosis not present

## 2014-09-04 DIAGNOSIS — R809 Proteinuria, unspecified: Secondary | ICD-10-CM | POA: Diagnosis not present

## 2014-09-04 DIAGNOSIS — E559 Vitamin D deficiency, unspecified: Secondary | ICD-10-CM | POA: Diagnosis not present

## 2014-09-04 DIAGNOSIS — N186 End stage renal disease: Secondary | ICD-10-CM | POA: Diagnosis not present

## 2014-09-04 DIAGNOSIS — D509 Iron deficiency anemia, unspecified: Secondary | ICD-10-CM | POA: Diagnosis not present

## 2014-09-08 DIAGNOSIS — E1129 Type 2 diabetes mellitus with other diabetic kidney complication: Secondary | ICD-10-CM | POA: Diagnosis not present

## 2014-09-08 DIAGNOSIS — I509 Heart failure, unspecified: Secondary | ICD-10-CM | POA: Diagnosis not present

## 2014-09-08 DIAGNOSIS — Z111 Encounter for screening for respiratory tuberculosis: Secondary | ICD-10-CM | POA: Diagnosis not present

## 2014-09-08 DIAGNOSIS — D631 Anemia in chronic kidney disease: Secondary | ICD-10-CM | POA: Diagnosis not present

## 2014-09-08 DIAGNOSIS — I10 Essential (primary) hypertension: Secondary | ICD-10-CM | POA: Diagnosis not present

## 2014-09-08 DIAGNOSIS — N185 Chronic kidney disease, stage 5: Secondary | ICD-10-CM | POA: Diagnosis not present

## 2014-09-15 DIAGNOSIS — E611 Iron deficiency: Secondary | ICD-10-CM | POA: Diagnosis not present

## 2014-09-15 DIAGNOSIS — D631 Anemia in chronic kidney disease: Secondary | ICD-10-CM | POA: Diagnosis not present

## 2014-09-15 DIAGNOSIS — D509 Iron deficiency anemia, unspecified: Secondary | ICD-10-CM | POA: Diagnosis not present

## 2014-09-15 DIAGNOSIS — Z992 Dependence on renal dialysis: Secondary | ICD-10-CM | POA: Diagnosis not present

## 2014-09-15 DIAGNOSIS — N2581 Secondary hyperparathyroidism of renal origin: Secondary | ICD-10-CM | POA: Diagnosis not present

## 2014-09-15 DIAGNOSIS — N186 End stage renal disease: Secondary | ICD-10-CM | POA: Diagnosis not present

## 2014-09-17 DIAGNOSIS — E611 Iron deficiency: Secondary | ICD-10-CM | POA: Diagnosis not present

## 2014-09-17 DIAGNOSIS — N2581 Secondary hyperparathyroidism of renal origin: Secondary | ICD-10-CM | POA: Diagnosis not present

## 2014-09-17 DIAGNOSIS — D631 Anemia in chronic kidney disease: Secondary | ICD-10-CM | POA: Diagnosis not present

## 2014-09-17 DIAGNOSIS — D509 Iron deficiency anemia, unspecified: Secondary | ICD-10-CM | POA: Diagnosis not present

## 2014-09-17 DIAGNOSIS — N186 End stage renal disease: Secondary | ICD-10-CM | POA: Diagnosis not present

## 2014-09-17 DIAGNOSIS — Z992 Dependence on renal dialysis: Secondary | ICD-10-CM | POA: Diagnosis not present

## 2014-09-19 DIAGNOSIS — Z992 Dependence on renal dialysis: Secondary | ICD-10-CM | POA: Diagnosis not present

## 2014-09-19 DIAGNOSIS — D509 Iron deficiency anemia, unspecified: Secondary | ICD-10-CM | POA: Diagnosis not present

## 2014-09-19 DIAGNOSIS — N2581 Secondary hyperparathyroidism of renal origin: Secondary | ICD-10-CM | POA: Diagnosis not present

## 2014-09-19 DIAGNOSIS — E611 Iron deficiency: Secondary | ICD-10-CM | POA: Diagnosis not present

## 2014-09-19 DIAGNOSIS — D631 Anemia in chronic kidney disease: Secondary | ICD-10-CM | POA: Diagnosis not present

## 2014-09-19 DIAGNOSIS — N186 End stage renal disease: Secondary | ICD-10-CM | POA: Diagnosis not present

## 2014-09-22 DIAGNOSIS — N2581 Secondary hyperparathyroidism of renal origin: Secondary | ICD-10-CM | POA: Diagnosis not present

## 2014-09-22 DIAGNOSIS — N186 End stage renal disease: Secondary | ICD-10-CM | POA: Diagnosis not present

## 2014-09-22 DIAGNOSIS — Z992 Dependence on renal dialysis: Secondary | ICD-10-CM | POA: Diagnosis not present

## 2014-09-22 DIAGNOSIS — D631 Anemia in chronic kidney disease: Secondary | ICD-10-CM | POA: Diagnosis not present

## 2014-09-22 DIAGNOSIS — E611 Iron deficiency: Secondary | ICD-10-CM | POA: Diagnosis not present

## 2014-09-22 DIAGNOSIS — D509 Iron deficiency anemia, unspecified: Secondary | ICD-10-CM | POA: Diagnosis not present

## 2014-09-24 DIAGNOSIS — D509 Iron deficiency anemia, unspecified: Secondary | ICD-10-CM | POA: Diagnosis not present

## 2014-09-24 DIAGNOSIS — E611 Iron deficiency: Secondary | ICD-10-CM | POA: Diagnosis not present

## 2014-09-24 DIAGNOSIS — D631 Anemia in chronic kidney disease: Secondary | ICD-10-CM | POA: Diagnosis not present

## 2014-09-24 DIAGNOSIS — N186 End stage renal disease: Secondary | ICD-10-CM | POA: Diagnosis not present

## 2014-09-24 DIAGNOSIS — N2581 Secondary hyperparathyroidism of renal origin: Secondary | ICD-10-CM | POA: Diagnosis not present

## 2014-09-24 DIAGNOSIS — Z992 Dependence on renal dialysis: Secondary | ICD-10-CM | POA: Diagnosis not present

## 2014-09-26 DIAGNOSIS — N186 End stage renal disease: Secondary | ICD-10-CM | POA: Diagnosis not present

## 2014-09-26 DIAGNOSIS — D631 Anemia in chronic kidney disease: Secondary | ICD-10-CM | POA: Diagnosis not present

## 2014-09-26 DIAGNOSIS — Z992 Dependence on renal dialysis: Secondary | ICD-10-CM | POA: Diagnosis not present

## 2014-09-26 DIAGNOSIS — N2581 Secondary hyperparathyroidism of renal origin: Secondary | ICD-10-CM | POA: Diagnosis not present

## 2014-09-26 DIAGNOSIS — E611 Iron deficiency: Secondary | ICD-10-CM | POA: Diagnosis not present

## 2014-09-26 DIAGNOSIS — D509 Iron deficiency anemia, unspecified: Secondary | ICD-10-CM | POA: Diagnosis not present

## 2014-09-29 DIAGNOSIS — E611 Iron deficiency: Secondary | ICD-10-CM | POA: Diagnosis not present

## 2014-09-29 DIAGNOSIS — D631 Anemia in chronic kidney disease: Secondary | ICD-10-CM | POA: Diagnosis not present

## 2014-09-29 DIAGNOSIS — N2581 Secondary hyperparathyroidism of renal origin: Secondary | ICD-10-CM | POA: Diagnosis not present

## 2014-09-29 DIAGNOSIS — D509 Iron deficiency anemia, unspecified: Secondary | ICD-10-CM | POA: Diagnosis not present

## 2014-09-29 DIAGNOSIS — Z992 Dependence on renal dialysis: Secondary | ICD-10-CM | POA: Diagnosis not present

## 2014-09-29 DIAGNOSIS — N186 End stage renal disease: Secondary | ICD-10-CM | POA: Diagnosis not present

## 2014-10-01 DIAGNOSIS — N186 End stage renal disease: Secondary | ICD-10-CM | POA: Diagnosis not present

## 2014-10-01 DIAGNOSIS — N2581 Secondary hyperparathyroidism of renal origin: Secondary | ICD-10-CM | POA: Diagnosis not present

## 2014-10-01 DIAGNOSIS — Z992 Dependence on renal dialysis: Secondary | ICD-10-CM | POA: Diagnosis not present

## 2014-10-01 DIAGNOSIS — E611 Iron deficiency: Secondary | ICD-10-CM | POA: Diagnosis not present

## 2014-10-01 DIAGNOSIS — D509 Iron deficiency anemia, unspecified: Secondary | ICD-10-CM | POA: Diagnosis not present

## 2014-10-01 DIAGNOSIS — D631 Anemia in chronic kidney disease: Secondary | ICD-10-CM | POA: Diagnosis not present

## 2014-10-02 ENCOUNTER — Encounter (INDEPENDENT_AMBULATORY_CARE_PROVIDER_SITE_OTHER): Payer: Self-pay | Admitting: *Deleted

## 2014-10-03 DIAGNOSIS — D631 Anemia in chronic kidney disease: Secondary | ICD-10-CM | POA: Diagnosis not present

## 2014-10-03 DIAGNOSIS — Z992 Dependence on renal dialysis: Secondary | ICD-10-CM | POA: Diagnosis not present

## 2014-10-03 DIAGNOSIS — N2581 Secondary hyperparathyroidism of renal origin: Secondary | ICD-10-CM | POA: Diagnosis not present

## 2014-10-03 DIAGNOSIS — E611 Iron deficiency: Secondary | ICD-10-CM | POA: Diagnosis not present

## 2014-10-03 DIAGNOSIS — D509 Iron deficiency anemia, unspecified: Secondary | ICD-10-CM | POA: Diagnosis not present

## 2014-10-03 DIAGNOSIS — N186 End stage renal disease: Secondary | ICD-10-CM | POA: Diagnosis not present

## 2014-10-06 DIAGNOSIS — Z992 Dependence on renal dialysis: Secondary | ICD-10-CM | POA: Diagnosis not present

## 2014-10-06 DIAGNOSIS — D509 Iron deficiency anemia, unspecified: Secondary | ICD-10-CM | POA: Diagnosis not present

## 2014-10-06 DIAGNOSIS — E611 Iron deficiency: Secondary | ICD-10-CM | POA: Diagnosis not present

## 2014-10-06 DIAGNOSIS — N2581 Secondary hyperparathyroidism of renal origin: Secondary | ICD-10-CM | POA: Diagnosis not present

## 2014-10-06 DIAGNOSIS — N186 End stage renal disease: Secondary | ICD-10-CM | POA: Diagnosis not present

## 2014-10-06 DIAGNOSIS — D631 Anemia in chronic kidney disease: Secondary | ICD-10-CM | POA: Diagnosis not present

## 2014-10-08 DIAGNOSIS — N186 End stage renal disease: Secondary | ICD-10-CM | POA: Diagnosis not present

## 2014-10-08 DIAGNOSIS — N2581 Secondary hyperparathyroidism of renal origin: Secondary | ICD-10-CM | POA: Diagnosis not present

## 2014-10-08 DIAGNOSIS — D509 Iron deficiency anemia, unspecified: Secondary | ICD-10-CM | POA: Diagnosis not present

## 2014-10-08 DIAGNOSIS — E611 Iron deficiency: Secondary | ICD-10-CM | POA: Diagnosis not present

## 2014-10-08 DIAGNOSIS — Z992 Dependence on renal dialysis: Secondary | ICD-10-CM | POA: Diagnosis not present

## 2014-10-08 DIAGNOSIS — D631 Anemia in chronic kidney disease: Secondary | ICD-10-CM | POA: Diagnosis not present

## 2014-10-10 DIAGNOSIS — D509 Iron deficiency anemia, unspecified: Secondary | ICD-10-CM | POA: Diagnosis not present

## 2014-10-10 DIAGNOSIS — N186 End stage renal disease: Secondary | ICD-10-CM | POA: Diagnosis not present

## 2014-10-10 DIAGNOSIS — D631 Anemia in chronic kidney disease: Secondary | ICD-10-CM | POA: Diagnosis not present

## 2014-10-10 DIAGNOSIS — E611 Iron deficiency: Secondary | ICD-10-CM | POA: Diagnosis not present

## 2014-10-10 DIAGNOSIS — N2581 Secondary hyperparathyroidism of renal origin: Secondary | ICD-10-CM | POA: Diagnosis not present

## 2014-10-10 DIAGNOSIS — Z992 Dependence on renal dialysis: Secondary | ICD-10-CM | POA: Diagnosis not present

## 2014-10-12 DIAGNOSIS — N186 End stage renal disease: Secondary | ICD-10-CM | POA: Diagnosis not present

## 2014-10-12 DIAGNOSIS — Z992 Dependence on renal dialysis: Secondary | ICD-10-CM | POA: Diagnosis not present

## 2014-10-13 DIAGNOSIS — N186 End stage renal disease: Secondary | ICD-10-CM | POA: Diagnosis not present

## 2014-10-13 DIAGNOSIS — D631 Anemia in chronic kidney disease: Secondary | ICD-10-CM | POA: Diagnosis not present

## 2014-10-13 DIAGNOSIS — D509 Iron deficiency anemia, unspecified: Secondary | ICD-10-CM | POA: Diagnosis not present

## 2014-10-13 DIAGNOSIS — Z992 Dependence on renal dialysis: Secondary | ICD-10-CM | POA: Diagnosis not present

## 2014-10-15 ENCOUNTER — Encounter: Payer: Self-pay | Admitting: Family

## 2014-10-15 DIAGNOSIS — N186 End stage renal disease: Secondary | ICD-10-CM | POA: Diagnosis not present

## 2014-10-15 DIAGNOSIS — D631 Anemia in chronic kidney disease: Secondary | ICD-10-CM | POA: Diagnosis not present

## 2014-10-15 DIAGNOSIS — D509 Iron deficiency anemia, unspecified: Secondary | ICD-10-CM | POA: Diagnosis not present

## 2014-10-15 DIAGNOSIS — Z992 Dependence on renal dialysis: Secondary | ICD-10-CM | POA: Diagnosis not present

## 2014-10-16 ENCOUNTER — Ambulatory Visit (INDEPENDENT_AMBULATORY_CARE_PROVIDER_SITE_OTHER): Payer: Medicare Other | Admitting: Family

## 2014-10-16 ENCOUNTER — Encounter: Payer: Self-pay | Admitting: Family

## 2014-10-16 ENCOUNTER — Other Ambulatory Visit: Payer: Self-pay | Admitting: *Deleted

## 2014-10-16 ENCOUNTER — Encounter (HOSPITAL_COMMUNITY): Payer: Self-pay | Admitting: *Deleted

## 2014-10-16 ENCOUNTER — Other Ambulatory Visit: Payer: Self-pay

## 2014-10-16 ENCOUNTER — Ambulatory Visit (HOSPITAL_COMMUNITY)
Admission: RE | Admit: 2014-10-16 | Discharge: 2014-10-16 | Disposition: A | Discharge status: Home / Self Care | Payer: Medicare Other | Source: Ambulatory Visit | Attending: Family | Admitting: Family

## 2014-10-16 VITALS — BP 117/64 | HR 76 | Temp 98.1°F | Resp 16 | Ht 72.0 in | Wt 211.0 lb

## 2014-10-16 DIAGNOSIS — Z992 Dependence on renal dialysis: Secondary | ICD-10-CM | POA: Diagnosis not present

## 2014-10-16 DIAGNOSIS — T82510A Breakdown (mechanical) of surgically created arteriovenous fistula, initial encounter: Secondary | ICD-10-CM

## 2014-10-16 DIAGNOSIS — Z4931 Encounter for adequacy testing for hemodialysis: Secondary | ICD-10-CM | POA: Diagnosis not present

## 2014-10-16 DIAGNOSIS — N186 End stage renal disease: Secondary | ICD-10-CM | POA: Diagnosis not present

## 2014-10-16 DIAGNOSIS — T82590A Other mechanical complication of surgically created arteriovenous fistula, initial encounter: Secondary | ICD-10-CM | POA: Insufficient documentation

## 2014-10-16 NOTE — Progress Notes (Signed)
Established Dialysis Access  History of Present Illness  Gregory Rangel is a 81 y.o. (14-Nov-1933) male patient of Dr. Scot Dock who comes in today, advised by his HD center to evaluate non functioning AV fistula. He underwent a left basilic vein transposition on 06/10/2014 and started using this access for HD on September 15, 2014. He dialyzes M-W-F. His last dialysis was Monday, August 1. HD access attempted Wednesday August 3, but wife states the technicians were unable to access his left upper arm AVF with several attempts. He last saw Dr. Scot Dock on 07/30/14 for 6 weeks follow up. At that time duplex of his fistula showed that the diameters of the fistula ranged from 0.54-0.65 cm, considered  very reasonable. Pt states he feels OK, does not c/o dyspnea. Pt denies pain, weakness, cold feeling, or pain in left hand. He does report intermittent numbness in left 4th and 5th fingers.    Past Medical History  Diagnosis Date  . Hypertension   . Shortness of breath     with exertion  . Diabetes mellitus without complication   . GERD (gastroesophageal reflux disease)   . Arthritis   . DVT (deep venous thrombosis)   . Varicose veins   . Chronic kidney disease   . Anemia   . Cancer     colon cancer and prostate    Social History History  Substance Use Topics  . Smoking status: Former Smoker    Quit date: 06/28/1983  . Smokeless tobacco: Former Systems developer  . Alcohol Use: No    Family History History reviewed. No pertinent family history.  Surgical History Past Surgical History  Procedure Laterality Date  . Back surgery      x 2  . Tonsillectomy    . Appendectomy    . Cholecystectomy    . Eye surgery      right eye with lens implant  . Right knee arthroscopy    . Total knee arthroplasty Right 07/06/2012    Procedure: RIGHT TOTAL KNEE ARTHROPLASTY;  Surgeon: Johnn Hai, MD;  Location: [facility];  Service: Orthopedics;  Laterality: Right;  . Esophagogastroduodenoscopy (egd) with  esophageal dilation N/A 08/09/2012    Procedure: ESOPHAGOGASTRODUODENOSCOPY (EGD) WITH ESOPHAGEAL DILATION;  Surgeon: Rogene Houston, MD;  Location: [facility];  Service: Endoscopy;  Laterality: N/A;  155-moved to 830 Ann to notify pt  . Colon surgery    . Endovenous ablation saphenous vein w/ laser Right 04-25-2013    right greater saphenous vein Curt Jews MD  . Cataract extraction w/phaco Left 11/05/2013    Procedure: CATARACT EXTRACTION PHACO AND INTRAOCULAR LENS PLACEMENT ([place]);  Surgeon: Elta Guadeloupe T. Gershon Crane, MD;  Location: [facility];  Service: Ophthalmology;  Laterality: Left;  CDE: 22.81  . Prostate surgery    . Colonoscopy    . Bascilic vein transposition Left 06/10/2014    Procedure: LEFT FIRST STAGE BASILIC VEIN TRANSPOSITION ;  Surgeon: Angelia Mould, MD;  Location: [facility];  Service: Vascular;  Laterality: Left;    Allergies  Allergen Reactions  . Penicillins Hives, Swelling and Rash    Current Outpatient Prescriptions  Medication Sig Dispense Refill  . acetaminophen (TYLENOL) 500 MG tablet Take 1,000 mg by mouth every 6 (six) hours as needed for mild pain, fever or headache.    . ALPRAZolam (XANAX) 0.5 MG tablet Take 0.5 mg by mouth at bedtime as needed for sleep.    Marland Kitchen amLODipine (NORVASC) 10 MG tablet Take 10 mg by mouth  daily.     . benazepril (LOTENSIN) 40 MG tablet Take 40 mg by mouth daily.     . calcitRIOL (ROCALTROL) 0.5 MCG capsule Take 0.5 mcg by mouth daily.    . cholecalciferol (VITAMIN D) 1000 UNITS tablet Take 1,000 Units by mouth daily.    Marland Kitchen glipiZIDE (GLUCOTROL XL) 10 MG 24 hr tablet Take 10 mg by mouth every morning.    . iron polysaccharides (NIFEREX) 150 MG capsule Take 150 mg by mouth daily.    . pantoprazole (PROTONIX) 40 MG tablet TAKE ONE TABLET BY MOUTH ONCE DAILY. 30 tablet 5  . sodium bicarbonate 650 MG tablet Take 650 mg by mouth 2 (two) times daily.    Marland Kitchen oxyCODONE (ROXICODONE) 5 MG immediate release tablet Take 1 tablet (5 mg total) by mouth every  6 (six) hours as needed. (Patient not taking: Reported on 10/16/2014) 30 tablet 0   No current facility-administered medications for this visit.     REVIEW OF SYSTEMS: see HPI for pertinent positives and negatives    PHYSICAL EXAMINATION:  Filed Vitals:   10/16/14 1520  BP: 117/64  Pulse: 76  Temp: 98.1 F (36.7 C)  TempSrc: Oral  Resp: 16  Height: 6' (1.829 m)  Weight: 211 lb (95.709 kg)  SpO2: 100%   Body mass index is 28.61 kg/(m^2).  General: The patient appears their stated age.   HEENT:  No gross abnormalities Pulmonary: Respirations are non-labored Abdomen: Soft and non-tender. Musculoskeletal: There are no major deformities, walks with a cane   Neurologic: No focal weakness or paresthesias are detected. Bilateral hand grip is 5/5. He is somewhat hard of hearing. Skin: There are no ulcer or rashes noted. Psychiatric: The patient has normal affect. Cardiovascular: There is a regular rate and rhythm. Both radial pulses are palpable. Left upper arm AV fistula has a palpable thrill.  Non-Invasive Vascular Imaging (10/16/2014):  DIALYSIS FISTULA DUPLEX EVALUATION    INDICATION: Upper arm arteriovenous fistula     PREVIOUS INTERVENTION(S): Left basilic vein transposition on 06/10/14    DUPLEX EXAM:     RIGHT  LEFT    Peak Systolic Velocities (cm/s) Ratio LOCATION Peak Systolic Velocities (cm/s)  Ratio        Inflow Artery 272         Arterio-Venous Anastomosis 466     Diameter (cm) Depth (cm)  Outflow Vein  Diameter (cm) Depth (cm)       Axilla  Vein 113  1.2 1.3       Prox. Brachium  Vein 162/502 3.1 1.16/0.52 0.56       Mid Brachium  Vein 197  0.78 0.28      Distal Brachium   Vein 466/674  0.63 0.37    Diameter (cm) Branch Location Diameter (cm)            ADDITIONAL FINDINGS: . No residual lumen of less than 27mm was noted in the outflow vein or anastomosis. . Velocity ratio of greater than 3 noted in the left proximal brachium outflow vein which appears  to be due to a change in vessel diameter from 1.2 to 0.52cm with a frozen valve leaflet versus minimally-occlusive, chronic thrombus also visualized. . Frozen valve leaflet noted in the left distal brachium level outflow vein. Marland Kitchen Antegrade, biphasic flow noted in the left distal radial artery.     IMPRESSION: Patent left arm arteriovenous fistula with findings noted above.     Medical Decision Making  BOWEN KIA is a 81  y.o. male who comes in today, advised by his HD center to evaluate non functioning AV fistula. He underwent a left basilic vein transposition on 06/10/2014 and started using this access for HD on September 15, 2014. He dialyzes M-W-F. His last dialysis was Monday, August 1. HD access attempted Wednesday August 3, but wife states the technicians were unable to access his left upper arm AVF with several attempts. He last saw Dr. Scot Dock on 07/30/14 for 6 weeks follow up. At that time duplex of his fistula showed that the diameters of the fistula ranged from 0.54-0.65 cm, considered  very reasonable. Pt states he feels OK, does not c/o dyspnea. Pt denies pain, weakness, cold feeling, or pain in left hand. He does report intermittent numbness in left 4th and 5th fingers.   Discussed pt HPI and HD Duplex result with Dr. Oneida Alar who examined pt and spoke with pt and wife. Pt will be scheduled for Diatek insertion tomorrow and schedule HD in 2 days. He will also be scheduled for a fistulagram with possible intervention by Dr. Bridgett Larsson on 10/23/14.   NICKEL, Sharmon Leyden, RN, MSN, FNP-C [facility] [place] Office: [phone]  10/16/2014, 3:57 PM  Clinic MD: Oneida Alar

## 2014-10-17 ENCOUNTER — Ambulatory Visit (HOSPITAL_COMMUNITY)
Admission: RE | Admit: 2014-10-17 | Discharge: 2014-10-17 | Disposition: A | Discharge status: Home / Self Care | Payer: Medicare Other | Source: Ambulatory Visit | Attending: Vascular Surgery | Admitting: Vascular Surgery

## 2014-10-17 ENCOUNTER — Ambulatory Visit (HOSPITAL_COMMUNITY): Payer: Medicare Other | Admitting: Anesthesiology

## 2014-10-17 ENCOUNTER — Encounter (HOSPITAL_COMMUNITY): Payer: Self-pay | Admitting: Anesthesiology

## 2014-10-17 ENCOUNTER — Ambulatory Visit (HOSPITAL_COMMUNITY): Payer: Medicare Other

## 2014-10-17 ENCOUNTER — Encounter (HOSPITAL_COMMUNITY)
Admission: RE | Disposition: A | Discharge status: Home / Self Care | Payer: Medicare Other | Source: Ambulatory Visit | Attending: Vascular Surgery

## 2014-10-17 DIAGNOSIS — Z95828 Presence of other vascular implants and grafts: Secondary | ICD-10-CM

## 2014-10-17 DIAGNOSIS — N186 End stage renal disease: Secondary | ICD-10-CM | POA: Insufficient documentation

## 2014-10-17 DIAGNOSIS — Z992 Dependence on renal dialysis: Secondary | ICD-10-CM | POA: Insufficient documentation

## 2014-10-17 DIAGNOSIS — Z87891 Personal history of nicotine dependence: Secondary | ICD-10-CM | POA: Insufficient documentation

## 2014-10-17 DIAGNOSIS — I120 Hypertensive chronic kidney disease with stage 5 chronic kidney disease or end stage renal disease: Secondary | ICD-10-CM | POA: Insufficient documentation

## 2014-10-17 DIAGNOSIS — D649 Anemia, unspecified: Secondary | ICD-10-CM | POA: Diagnosis not present

## 2014-10-17 DIAGNOSIS — Z85038 Personal history of other malignant neoplasm of large intestine: Secondary | ICD-10-CM | POA: Insufficient documentation

## 2014-10-17 DIAGNOSIS — T82898A Other specified complication of vascular prosthetic devices, implants and grafts, initial encounter: Secondary | ICD-10-CM | POA: Diagnosis not present

## 2014-10-17 DIAGNOSIS — E1122 Type 2 diabetes mellitus with diabetic chronic kidney disease: Secondary | ICD-10-CM | POA: Insufficient documentation

## 2014-10-17 DIAGNOSIS — R0602 Shortness of breath: Secondary | ICD-10-CM | POA: Diagnosis not present

## 2014-10-17 DIAGNOSIS — K219 Gastro-esophageal reflux disease without esophagitis: Secondary | ICD-10-CM | POA: Diagnosis not present

## 2014-10-17 DIAGNOSIS — Z452 Encounter for adjustment and management of vascular access device: Secondary | ICD-10-CM | POA: Diagnosis not present

## 2014-10-17 HISTORY — DX: Calculus of kidney: N20.0

## 2014-10-17 HISTORY — PX: INSERTION OF DIALYSIS CATHETER: SHX1324

## 2014-10-17 LAB — GLUCOSE, CAPILLARY: Glucose-Capillary: 142 mg/dL — ABNORMAL HIGH (ref 65–99)

## 2014-10-17 LAB — POCT I-STAT 4, (NA,K, GLUC, HGB,HCT)
Glucose, Bld: 122 mg/dL — ABNORMAL HIGH (ref 65–99)
HCT: 39 % (ref 39.0–52.0)
HEMOGLOBIN: 13.3 g/dL (ref 13.0–17.0)
Potassium: 4.2 mmol/L (ref 3.5–5.1)
Sodium: 142 mmol/L (ref 135–145)

## 2014-10-17 SURGERY — INSERTION OF DIALYSIS CATHETER
Anesthesia: Monitor Anesthesia Care | Site: Neck | Laterality: Right

## 2014-10-17 MED ORDER — DEXTROSE 5 % IV SOLN
INTRAVENOUS | Status: DC | PRN
  Administered 2014-10-17: 11:00:00 via INTRAVENOUS

## 2014-10-17 MED ORDER — FENTANYL CITRATE (PF) 100 MCG/2ML IJ SOLN
INTRAMUSCULAR | Status: DC | PRN
  Administered 2014-10-17: 100 ug via INTRAVENOUS
  Administered 2014-10-17 (×3): 50 ug via INTRAVENOUS

## 2014-10-17 MED ORDER — LIDOCAINE HCL (CARDIAC) 20 MG/ML IV SOLN
INTRAVENOUS | Status: DC | PRN
  Administered 2014-10-17: 40 mg via INTRAVENOUS

## 2014-10-17 MED ORDER — VANCOMYCIN HCL IN DEXTROSE 1-5 GM/200ML-% IV SOLN
1000.0000 mg | INTRAVENOUS | Status: AC
  Administered 2014-10-17: 1000 mg via INTRAVENOUS
  Filled 2014-10-17: qty 200

## 2014-10-17 MED ORDER — LIDOCAINE-EPINEPHRINE 0.5 %-1:200000 IJ SOLN
INTRAMUSCULAR | Status: AC
  Filled 2014-10-17: qty 1

## 2014-10-17 MED ORDER — LIDOCAINE-EPINEPHRINE 0.5 %-1:200000 IJ SOLN
INTRAMUSCULAR | Status: DC | PRN
  Administered 2014-10-17: 10 mL

## 2014-10-17 MED ORDER — ONDANSETRON HCL 4 MG/2ML IJ SOLN
INTRAMUSCULAR | Status: DC | PRN
  Administered 2014-10-17: 4 mg via INTRAVENOUS

## 2014-10-17 MED ORDER — HEPARIN SODIUM (PORCINE) 1000 UNIT/ML IJ SOLN
INTRAMUSCULAR | Status: AC
  Filled 2014-10-17: qty 1

## 2014-10-17 MED ORDER — CHLORHEXIDINE GLUCONATE CLOTH 2 % EX PADS: 6.0000 | MEDICATED_PAD | Freq: Once | CUTANEOUS | Status: DC

## 2014-10-17 MED ORDER — SODIUM CHLORIDE 0.9 % IV SOLN
INTRAVENOUS | Status: DC | PRN
  Administered 2014-10-17: 11:00:00 via INTRAVENOUS

## 2014-10-17 MED ORDER — SODIUM CHLORIDE 0.9 % IV SOLN
INTRAVENOUS | Status: DC
  Administered 2014-10-17: 10 mL/h via INTRAVENOUS

## 2014-10-17 MED ORDER — PROPOFOL 10 MG/ML IV BOLUS
INTRAVENOUS | Status: DC | PRN
  Administered 2014-10-17 (×2): 20 mg via INTRAVENOUS

## 2014-10-17 MED ORDER — SODIUM CHLORIDE 0.9 % IR SOLN
Status: DC | PRN
  Administered 2014-10-17: 500 mL

## 2014-10-17 MED ORDER — HEPARIN SODIUM (PORCINE) 1000 UNIT/ML IJ SOLN
INTRAMUSCULAR | Status: DC | PRN
Start: 2014-10-17 — End: 2014-10-17
  Administered 2014-10-17: 1000 [IU]

## 2014-10-17 MED ORDER — FENTANYL CITRATE (PF) 250 MCG/5ML IJ SOLN
INTRAMUSCULAR | Status: AC
  Filled 2014-10-17: qty 5

## 2014-10-17 MED ORDER — 0.9 % SODIUM CHLORIDE (POUR BTL) OPTIME
TOPICAL | Status: DC | PRN
  Administered 2014-10-17: 1000 mL

## 2014-10-17 SURGICAL SUPPLY — 52 items
APL SKNCLS STERI-STRIP NONHPOA (GAUZE/BANDAGES/DRESSINGS)
BAG BANDED W/RUBBER/TAPE 36X54 (MISCELLANEOUS) ×2 IMPLANT
BAG DECANTER FOR FLEXI CONT (MISCELLANEOUS) ×3 IMPLANT
BAG EQP BAND 135X91 W/RBR TAPE (MISCELLANEOUS) ×1
BENZOIN TINCTURE PRP APPL 2/3 (GAUZE/BANDAGES/DRESSINGS) IMPLANT
BIOPATCH RED 1 DISK 7.0 (GAUZE/BANDAGES/DRESSINGS) ×2 IMPLANT
BIOPATCH RED 1IN DISK 7.0MM (GAUZE/BANDAGES/DRESSINGS) ×1
CATH CANNON HEMO 15F 50CM (CATHETERS) IMPLANT
CATH CANNON HEMO 15FR 19 (HEMODIALYSIS SUPPLIES) IMPLANT
CATH CANNON HEMO 15FR 23CM (HEMODIALYSIS SUPPLIES) IMPLANT
CATH CANNON HEMO 15FR 31CM (HEMODIALYSIS SUPPLIES) IMPLANT
CATH CANNON HEMO 15FR 32 (HEMODIALYSIS SUPPLIES) IMPLANT
CATH CANNON HEMO 15FR 32CM (HEMODIALYSIS SUPPLIES) ×3 IMPLANT
CLOSURE WOUND 1/2 X4 (GAUZE/BANDAGES/DRESSINGS)
COVER DOME SNAP 22 D (MISCELLANEOUS) ×2 IMPLANT
COVER PROBE W GEL 5X96 (DRAPES) IMPLANT
DECANTER SPIKE VIAL GLASS SM (MISCELLANEOUS) ×3 IMPLANT
DRAPE C-ARM 42X72 X-RAY (DRAPES) ×3 IMPLANT
DRAPE CHEST BREAST 15X10 FENES (DRAPES) ×3 IMPLANT
GAUZE SPONGE 2X2 8PLY NS (GAUZE/BANDAGES/DRESSINGS) ×4 IMPLANT
GAUZE SPONGE 2X2 8PLY STRL LF (GAUZE/BANDAGES/DRESSINGS) ×1 IMPLANT
GAUZE SPONGE 4X4 16PLY XRAY LF (GAUZE/BANDAGES/DRESSINGS) ×3 IMPLANT
GLOVE BIO SURGEON STRL SZ 6.5 (GLOVE) ×1 IMPLANT
GLOVE BIO SURGEONS STRL SZ 6.5 (GLOVE) ×1
GLOVE BIOGEL PI IND STRL 6.5 (GLOVE) IMPLANT
GLOVE BIOGEL PI INDICATOR 6.5 (GLOVE) ×2
GLOVE SS BIOGEL STRL SZ 7.5 (GLOVE) ×1 IMPLANT
GLOVE SUPERSENSE BIOGEL SZ 7.5 (GLOVE) ×2
GOWN BRE IMP SLV AUR XL STRL (GOWN DISPOSABLE) ×2 IMPLANT
GOWN STRL REUS W/ TWL LRG LVL3 (GOWN DISPOSABLE) ×2 IMPLANT
GOWN STRL REUS W/TWL LRG LVL3 (GOWN DISPOSABLE) ×6
KIT BASIN OR (CUSTOM PROCEDURE TRAY) ×3 IMPLANT
KIT ROOM TURNOVER OR (KITS) ×3 IMPLANT
NDL 18GX1X1/2 (RX/OR ONLY) (NEEDLE) ×1 IMPLANT
NDL HYPO 25GX1X1/2 BEV (NEEDLE) ×1 IMPLANT
NEEDLE 18GX1X1/2 (RX/OR ONLY) (NEEDLE) ×3 IMPLANT
NEEDLE 22X1 1/2 (OR ONLY) (NEEDLE) ×3 IMPLANT
NEEDLE HYPO 25GX1X1/2 BEV (NEEDLE) ×3 IMPLANT
NS IRRIG 1000ML POUR BTL (IV SOLUTION) ×3 IMPLANT
PACK SURGICAL SETUP 50X90 (CUSTOM PROCEDURE TRAY) ×3 IMPLANT
PAD ARMBOARD 7.5X6 YLW CONV (MISCELLANEOUS) ×6 IMPLANT
SOAP 2 % CHG 4 OZ (WOUND CARE) ×3 IMPLANT
SPONGE GAUZE 2X2 STER 10/PKG (GAUZE/BANDAGES/DRESSINGS) ×2
STRIP CLOSURE SKIN 1/2X4 (GAUZE/BANDAGES/DRESSINGS) IMPLANT
SUT ETHILON 3 0 PS 1 (SUTURE) ×3 IMPLANT
SUT VICRYL 4-0 PS2 18IN ABS (SUTURE) ×3 IMPLANT
SYR 20CC LL (SYRINGE) ×3 IMPLANT
SYR 5ML LL (SYRINGE) ×6 IMPLANT
SYR CONTROL 10ML LL (SYRINGE) ×3 IMPLANT
SYRINGE 10CC LL (SYRINGE) ×3 IMPLANT
TAPE CLOTH SURG 4X10 WHT LF (GAUZE/BANDAGES/DRESSINGS) ×2 IMPLANT
WATER STERILE IRR 1000ML POUR (IV SOLUTION) ×3 IMPLANT

## 2014-10-17 NOTE — Op Note (Signed)
    OPERATIVE REPORT  DATE OF SURGERY: 10/17/2014  PATIENT: Gregory Rangel, 81 y.o. male MRN: [id number]  DOB: 1933-08-24  PRE-OPERATIVE DIAGNOSIS: End-stage renal disease with poorly functioning left upper arm AV fistula  POST-OPERATIVE DIAGNOSIS:  Same  PROCEDURE: Right IJ hemodialysis catheter with SonoSite visualization  SURGEON:  Curt Jews, M.D.  PHYSICIAN ASSISTANT: Nurse  ANESTHESIA:  Local with sedation  EBL: Minimal ml     BLOOD ADMINISTERED: None  DRAINS: None  SPECIMEN: None  COUNTS CORRECT:  YES  PLAN OF CARE: PACU with chest x-ray pending   PATIENT DISPOSITION:  PACU - hemodynamically stable  PROCEDURE DETAILS: Patient taking up and placed supine position where the area the right left neck and chest prepped in sterile fashion. Using local anesthesia and SonoSite guidance the right internal jugular vein was accessed with 18-gauge needle a guidewire was passed to the level of the right atrium. This was confirmed with fluoroscopy. A dilator peel-away sheath was passed over the guidewire and the dilator and guidewire removed. A 27 cm split tip dialysis catheter was passed through the peel-away sheath and the peel-away sheath was removed. The tips were positioned the level of the distal right atrium. Catheter was brought through subcutaneous tunnel through a separate stab incision in the 2 lm ports were attached. Both lumens flushed and aspirated easily and were locked with 1000 unit per cc heparin. The catheter was secured to the skin with a 3-0 nylon stitch and the entry site was closed with a 4-0 subcuticular Vicryls stitch. Sterile dressing was applied the patient was transferred to the recovery room stable condition   Curt Jews, M.D. 10/17/2014 12:13 PM

## 2014-10-17 NOTE — Anesthesia Procedure Notes (Signed)
Procedure Name: MAC Date/Time: 10/17/2014 11:38 AM Performed by: Jacquiline Doe A Pre-anesthesia Checklist: Patient identified, Emergency Drugs available, Suction available, Patient being monitored and Timeout performed Patient Re-evaluated:Patient Re-evaluated prior to inductionOxygen Delivery Method: Simple face mask Intubation Type: IV induction Placement Confirmation: positive ETCO2 Dental Injury: Teeth and Oropharynx as per pre-operative assessment

## 2014-10-17 NOTE — Interval H&P Note (Signed)
History and Physical Interval Note:  10/17/2014 11:18 AM  [place]  has presented today for surgery, with the diagnosis of End Stage Renal Disease N18.6  The various methods of treatment have been discussed with the patient and family. After consideration of risks, benefits and other options for treatment, the patient has consented to  Procedure(s): INSERTION OF DIALYSIS CATHETER (N/A) as a surgical intervention .  The patient's history has been reviewed, patient examined, no change in status, stable for surgery.  I have reviewed the patient's chart and labs.  Questions were answered to the patient's satisfaction.     Curt Jews

## 2014-10-17 NOTE — Anesthesia Preprocedure Evaluation (Signed)
Anesthesia Evaluation  Patient identified by MRN, date of birth, ID band Patient awake    Reviewed: Allergy & Precautions, NPO status , Patient's Chart, lab work & pertinent test results  Airway Mallampati: II   Neck ROM: full    Dental   Pulmonary shortness of breath, former smoker,  breath sounds clear to auscultation        Cardiovascular hypertension, + Peripheral Vascular Disease Rhythm:regular Rate:Normal     Neuro/Psych    GI/Hepatic GERD-  ,  Endo/Other  diabetes, Type 2  Renal/GU ESRF and DialysisRenal disease     Musculoskeletal  (+) Arthritis -,   Abdominal   Peds  Hematology   Anesthesia Other Findings   Reproductive/Obstetrics                             Anesthesia Physical Anesthesia Plan  ASA: III  Anesthesia Plan: MAC   Post-op Pain Management:    Induction: Intravenous  Airway Management Planned: Simple Face Mask  Additional Equipment:   Intra-op Plan:   Post-operative Plan:   Informed Consent: I have reviewed the patients History and Physical, chart, labs and discussed the procedure including the risks, benefits and alternatives for the proposed anesthesia with the patient or authorized representative who has indicated his/her understanding and acceptance.     Plan Discussed with: CRNA, Anesthesiologist and Surgeon  Anesthesia Plan Comments:         Anesthesia Quick Evaluation

## 2014-10-17 NOTE — Anesthesia Postprocedure Evaluation (Signed)
Anesthesia Post Note  Patient: Gregory Rangel  Procedure(s) Performed: Procedure(s) (LRB): INSERTION OF DIALYSIS CATHETER (Right)  Anesthesia type: MAC  Patient location: PACU  Post pain: Pain level controlled and Adequate analgesia  Post assessment: Post-op Vital signs reviewed, Patient's Cardiovascular Status Stable and Respiratory Function Stable  Last Vitals:  Filed Vitals:   10/17/14 1341  BP: 120/60  Pulse: 79  Temp:   Resp: 14    Post vital signs: Reviewed and stable  Level of consciousness: awake, alert  and oriented  Complications: No apparent anesthesia complications

## 2014-10-17 NOTE — H&P (View-Only) (Signed)
Established Dialysis Access  History of Present Illness  Gregory Rangel is a 81 y.o. (03-26-1933) male patient of Dr. Scot Dock who comes in today, advised by his HD center to evaluate non functioning AV fistula. He underwent a left basilic vein transposition on 06/10/2014 and started using this access for HD on September 15, 2014. He dialyzes M-W-F. His last dialysis was Monday, August 1. HD access attempted Wednesday August 3, but wife states the technicians were unable to access his left upper arm AVF with several attempts. He last saw Dr. Scot Dock on 07/30/14 for 6 weeks follow up. At that time duplex of his fistula showed that the diameters of the fistula ranged from 0.54-0.65 cm, considered  very reasonable. Pt states he feels OK, does not c/o dyspnea. Pt denies pain, weakness, cold feeling, or pain in left hand. He does report intermittent numbness in left 4th and 5th fingers.    Past Medical History  Diagnosis Date  . Hypertension   . Shortness of breath     with exertion  . Diabetes mellitus without complication   . GERD (gastroesophageal reflux disease)   . Arthritis   . DVT (deep venous thrombosis)   . Varicose veins   . Chronic kidney disease   . Anemia   . Cancer     colon cancer and prostate    Social History History  Substance Use Topics  . Smoking status: Former Smoker    Quit date: 06/28/1983  . Smokeless tobacco: Former Systems developer  . Alcohol Use: No    Family History History reviewed. No pertinent family history.  Surgical History Past Surgical History  Procedure Laterality Date  . Back surgery      x 2  . Tonsillectomy    . Appendectomy    . Cholecystectomy    . Eye surgery      right eye with lens implant  . Right knee arthroscopy    . Total knee arthroplasty Right 07/06/2012    Procedure: RIGHT TOTAL KNEE ARTHROPLASTY;  Surgeon: Johnn Hai, MD;  Location: [facility];  Service: Orthopedics;  Laterality: Right;  . Esophagogastroduodenoscopy (egd) with  esophageal dilation N/A 08/09/2012    Procedure: ESOPHAGOGASTRODUODENOSCOPY (EGD) WITH ESOPHAGEAL DILATION;  Surgeon: Rogene Houston, MD;  [facility]: [facility];  Service: Endoscopy;  Laterality: N/A;  155-moved to 830 Ann to notify pt  . Colon surgery    . Endovenous ablation saphenous vein w/ laser Right 04-25-2013    right greater saphenous vein Curt Jews MD  . Cataract extraction w/phaco Left 11/05/2013    Procedure: CATARACT EXTRACTION PHACO AND INTRAOCULAR LENS PLACEMENT ([place]);  Surgeon: Elta Guadeloupe T. Gershon Crane, MD;  Location: [facility];  Service: Ophthalmology;  Laterality: Left;  CDE: 22.81  . Prostate surgery    . Colonoscopy    . Bascilic vein transposition Left 06/10/2014    Procedure: LEFT FIRST STAGE BASILIC VEIN TRANSPOSITION ;  Surgeon: Angelia Mould, MD;  Location: [facility];  Service: Vascular;  Laterality: Left;    Allergies  Allergen Reactions  . Penicillins Hives, Swelling and Rash    Current Outpatient Prescriptions  Medication Sig Dispense Refill  . acetaminophen (TYLENOL) 500 MG tablet Take 1,000 mg by mouth every 6 (six) hours as needed for mild pain, fever or headache.    . ALPRAZolam (XANAX) 0.5 MG tablet Take 0.5 mg by mouth at bedtime as needed for sleep.    Marland Kitchen amLODipine (NORVASC) 10 MG tablet Take 10 mg by mouth  daily.     . benazepril (LOTENSIN) 40 MG tablet Take 40 mg by mouth daily.     . calcitRIOL (ROCALTROL) 0.5 MCG capsule Take 0.5 mcg by mouth daily.    . cholecalciferol (VITAMIN D) 1000 UNITS tablet Take 1,000 Units by mouth daily.    Marland Kitchen glipiZIDE (GLUCOTROL XL) 10 MG 24 hr tablet Take 10 mg by mouth every morning.    . iron polysaccharides (NIFEREX) 150 MG capsule Take 150 mg by mouth daily.    . pantoprazole (PROTONIX) 40 MG tablet TAKE ONE TABLET BY MOUTH ONCE DAILY. 30 tablet 5  . sodium bicarbonate 650 MG tablet Take 650 mg by mouth 2 (two) times daily.    Marland Kitchen oxyCODONE (ROXICODONE) 5 MG immediate release tablet Take 1 tablet (5 mg total) by mouth every  6 (six) hours as needed. (Patient not taking: Reported on 10/16/2014) 30 tablet 0   No current facility-administered medications for this visit.     REVIEW OF SYSTEMS: see HPI for pertinent positives and negatives    PHYSICAL EXAMINATION:  Filed Vitals:   10/16/14 1520  BP: 117/64  Pulse: 76  Temp: 98.1 F (36.7 C)  TempSrc: Oral  Resp: 16  Height: 6' (1.829 m)  Weight: 211 lb (95.709 kg)  SpO2: 100%   Body mass index is 28.61 kg/(m^2).  General: The patient appears their stated age.   HEENT:  No gross abnormalities Pulmonary: Respirations are non-labored Abdomen: Soft and non-tender. Musculoskeletal: There are no major deformities, walks with a cane   Neurologic: No focal weakness or paresthesias are detected. Bilateral hand grip is 5/5. He is somewhat hard of hearing. Skin: There are no ulcer or rashes noted. Psychiatric: The patient has normal affect. Cardiovascular: There is a regular rate and rhythm. Both radial pulses are palpable. Left upper arm AV fistula has a palpable thrill.  Non-Invasive Vascular Imaging (10/16/2014):  DIALYSIS FISTULA DUPLEX EVALUATION    INDICATION: Upper arm arteriovenous fistula     PREVIOUS INTERVENTION(S): Left basilic vein transposition on 06/10/14    DUPLEX EXAM:     RIGHT  LEFT    Peak Systolic Velocities (cm/s) Ratio LOCATION Peak Systolic Velocities (cm/s)  Ratio        Inflow Artery 272         Arterio-Venous Anastomosis 466     Diameter (cm) Depth (cm)  Outflow Vein  Diameter (cm) Depth (cm)       Axilla  Vein 113  1.2 1.3       Prox. Brachium  Vein 162/502 3.1 1.16/0.52 0.56       Mid Brachium  Vein 197  0.78 0.28      Distal Brachium   Vein 466/674  0.63 0.37    Diameter (cm) Branch Location Diameter (cm)            ADDITIONAL FINDINGS: . No residual lumen of less than 45mm was noted in the outflow vein or anastomosis. . Velocity ratio of greater than 3 noted in the left proximal brachium outflow vein which appears  to be due to a change in vessel diameter from 1.2 to 0.52cm with a frozen valve leaflet versus minimally-occlusive, chronic thrombus also visualized. . Frozen valve leaflet noted in the left distal brachium level outflow vein. Marland Kitchen Antegrade, biphasic flow noted in the left distal radial artery.     IMPRESSION: Patent left arm arteriovenous fistula with findings noted above.     Medical Decision Making  DOLLIE MAYSE is a 81  y.o. male who comes in today, advised by his HD center to evaluate non functioning AV fistula. He underwent a left basilic vein transposition on 06/10/2014 and started using this access for HD on September 15, 2014. He dialyzes M-W-F. His last dialysis was Monday, August 1. HD access attempted Wednesday August 3, but wife states the technicians were unable to access his left upper arm AVF with several attempts. He last saw Dr. Scot Dock on 07/30/14 for 6 weeks follow up. At that time duplex of his fistula showed that the diameters of the fistula ranged from 0.54-0.65 cm, considered  very reasonable. Pt states he feels OK, does not c/o dyspnea. Pt denies pain, weakness, cold feeling, or pain in left hand. He does report intermittent numbness in left 4th and 5th fingers.   Discussed pt HPI and HD Duplex result with Dr. Oneida Alar who examined pt and spoke with pt and wife. Pt will be scheduled for Diatek insertion tomorrow and schedule HD in 2 days. He will also be scheduled for a fistulagram with possible intervention by Dr. Bridgett Larsson on 10/23/14.   Lawrance Wiedemann, Sharmon Leyden, RN, MSN, FNP-C [facility] Office: [phone]  10/16/2014, 3:57 PM  Clinic MD: Oneida Alar

## 2014-10-17 NOTE — Transfer of Care (Signed)
Immediate Anesthesia Transfer of Care Note  Patient: Gregory Rangel  Procedure(s) Performed: Procedure(s): INSERTION OF DIALYSIS CATHETER (Right)  Patient Location: PACU  Anesthesia Type:MAC  Level of Consciousness: awake, alert , oriented, patient cooperative and responds to stimulation  Airway & Oxygen Therapy: Patient Spontanous Breathing  Post-op Assessment: Report given to RN, Post -op Vital signs reviewed and stable, Patient moving all extremities and Patient moving all extremities X 4  Post vital signs: Reviewed and stable  Last Vitals:  Filed Vitals:   10/17/14 1058  BP: 131/52  Pulse: 72  Temp: 36.1 C  Resp: 16    Complications: No apparent anesthesia complications

## 2014-10-18 DIAGNOSIS — D509 Iron deficiency anemia, unspecified: Secondary | ICD-10-CM | POA: Diagnosis not present

## 2014-10-18 DIAGNOSIS — Z992 Dependence on renal dialysis: Secondary | ICD-10-CM | POA: Diagnosis not present

## 2014-10-18 DIAGNOSIS — D631 Anemia in chronic kidney disease: Secondary | ICD-10-CM | POA: Diagnosis not present

## 2014-10-18 DIAGNOSIS — N186 End stage renal disease: Secondary | ICD-10-CM | POA: Diagnosis not present

## 2014-10-20 ENCOUNTER — Other Ambulatory Visit (HOSPITAL_COMMUNITY)
Admission: RE | Admit: 2014-10-20 | Discharge: 2014-10-20 | Disposition: A | Discharge status: Home / Self Care | Payer: Medicare Other | Source: Other Acute Inpatient Hospital | Attending: Nephrology | Admitting: Nephrology

## 2014-10-20 ENCOUNTER — Encounter (HOSPITAL_COMMUNITY): Payer: Self-pay | Admitting: Vascular Surgery

## 2014-10-20 DIAGNOSIS — N186 End stage renal disease: Secondary | ICD-10-CM | POA: Diagnosis not present

## 2014-10-20 DIAGNOSIS — D509 Iron deficiency anemia, unspecified: Secondary | ICD-10-CM | POA: Diagnosis not present

## 2014-10-20 DIAGNOSIS — D631 Anemia in chronic kidney disease: Secondary | ICD-10-CM | POA: Diagnosis not present

## 2014-10-20 DIAGNOSIS — E875 Hyperkalemia: Secondary | ICD-10-CM | POA: Insufficient documentation

## 2014-10-20 DIAGNOSIS — Z992 Dependence on renal dialysis: Secondary | ICD-10-CM | POA: Diagnosis not present

## 2014-10-20 LAB — POTASSIUM: Potassium: 3.1 mmol/L — ABNORMAL LOW (ref 3.5–5.1)

## 2014-10-21 DIAGNOSIS — I871 Compression of vein: Secondary | ICD-10-CM | POA: Diagnosis not present

## 2014-10-21 DIAGNOSIS — T82858A Stenosis of vascular prosthetic devices, implants and grafts, initial encounter: Secondary | ICD-10-CM | POA: Diagnosis not present

## 2014-10-21 DIAGNOSIS — D631 Anemia in chronic kidney disease: Secondary | ICD-10-CM | POA: Diagnosis not present

## 2014-10-21 DIAGNOSIS — D509 Iron deficiency anemia, unspecified: Secondary | ICD-10-CM | POA: Diagnosis not present

## 2014-10-21 DIAGNOSIS — Z992 Dependence on renal dialysis: Secondary | ICD-10-CM | POA: Diagnosis not present

## 2014-10-21 DIAGNOSIS — N186 End stage renal disease: Secondary | ICD-10-CM | POA: Diagnosis not present

## 2014-10-22 DIAGNOSIS — N186 End stage renal disease: Secondary | ICD-10-CM | POA: Diagnosis not present

## 2014-10-22 DIAGNOSIS — D509 Iron deficiency anemia, unspecified: Secondary | ICD-10-CM | POA: Diagnosis not present

## 2014-10-22 DIAGNOSIS — Z992 Dependence on renal dialysis: Secondary | ICD-10-CM | POA: Diagnosis not present

## 2014-10-22 DIAGNOSIS — D631 Anemia in chronic kidney disease: Secondary | ICD-10-CM | POA: Diagnosis not present

## 2014-10-23 ENCOUNTER — Encounter (HOSPITAL_COMMUNITY): Payer: Self-pay | Admitting: Vascular Surgery

## 2014-10-23 ENCOUNTER — Encounter (HOSPITAL_COMMUNITY)
Admission: RE | Disposition: A | Discharge status: Home / Self Care | Payer: Self-pay | Source: Ambulatory Visit | Attending: Vascular Surgery

## 2014-10-23 ENCOUNTER — Ambulatory Visit (HOSPITAL_COMMUNITY)
Admission: RE | Admit: 2014-10-23 | Discharge: 2014-10-23 | Disposition: A | Discharge status: Home / Self Care | Payer: Medicare Other | Source: Ambulatory Visit | Attending: Vascular Surgery | Admitting: Vascular Surgery

## 2014-10-23 DIAGNOSIS — T82318A Breakdown (mechanical) of other vascular grafts, initial encounter: Secondary | ICD-10-CM | POA: Diagnosis not present

## 2014-10-23 DIAGNOSIS — Z87891 Personal history of nicotine dependence: Secondary | ICD-10-CM | POA: Diagnosis not present

## 2014-10-23 DIAGNOSIS — I120 Hypertensive chronic kidney disease with stage 5 chronic kidney disease or end stage renal disease: Secondary | ICD-10-CM | POA: Diagnosis not present

## 2014-10-23 DIAGNOSIS — D649 Anemia, unspecified: Secondary | ICD-10-CM | POA: Diagnosis not present

## 2014-10-23 DIAGNOSIS — Z85038 Personal history of other malignant neoplasm of large intestine: Secondary | ICD-10-CM | POA: Insufficient documentation

## 2014-10-23 DIAGNOSIS — Y832 Surgical operation with anastomosis, bypass or graft as the cause of abnormal reaction of the patient, or of later complication, without mention of misadventure at the time of the procedure: Secondary | ICD-10-CM | POA: Insufficient documentation

## 2014-10-23 DIAGNOSIS — Z992 Dependence on renal dialysis: Secondary | ICD-10-CM | POA: Insufficient documentation

## 2014-10-23 DIAGNOSIS — M1990 Unspecified osteoarthritis, unspecified site: Secondary | ICD-10-CM | POA: Diagnosis not present

## 2014-10-23 DIAGNOSIS — T82898A Other specified complication of vascular prosthetic devices, implants and grafts, initial encounter: Secondary | ICD-10-CM | POA: Diagnosis not present

## 2014-10-23 DIAGNOSIS — Z86718 Personal history of other venous thrombosis and embolism: Secondary | ICD-10-CM | POA: Diagnosis not present

## 2014-10-23 DIAGNOSIS — N186 End stage renal disease: Secondary | ICD-10-CM

## 2014-10-23 DIAGNOSIS — Z880 Allergy status to penicillin: Secondary | ICD-10-CM | POA: Insufficient documentation

## 2014-10-23 DIAGNOSIS — Z96651 Presence of right artificial knee joint: Secondary | ICD-10-CM | POA: Diagnosis not present

## 2014-10-23 DIAGNOSIS — K219 Gastro-esophageal reflux disease without esophagitis: Secondary | ICD-10-CM | POA: Diagnosis not present

## 2014-10-23 DIAGNOSIS — N185 Chronic kidney disease, stage 5: Secondary | ICD-10-CM | POA: Diagnosis not present

## 2014-10-23 DIAGNOSIS — I10 Essential (primary) hypertension: Secondary | ICD-10-CM | POA: Diagnosis not present

## 2014-10-23 DIAGNOSIS — T82590A Other mechanical complication of surgically created arteriovenous fistula, initial encounter: Secondary | ICD-10-CM | POA: Diagnosis present

## 2014-10-23 DIAGNOSIS — E1122 Type 2 diabetes mellitus with diabetic chronic kidney disease: Secondary | ICD-10-CM | POA: Insufficient documentation

## 2014-10-23 DIAGNOSIS — M545 Low back pain: Secondary | ICD-10-CM | POA: Diagnosis not present

## 2014-10-23 DIAGNOSIS — E1140 Type 2 diabetes mellitus with diabetic neuropathy, unspecified: Secondary | ICD-10-CM | POA: Diagnosis not present

## 2014-10-23 HISTORY — PX: ULTRASOUND GUIDANCE FOR VASCULAR ACCESS: SHX6516

## 2014-10-23 HISTORY — PX: PERIPHERAL VASCULAR CATHETERIZATION: SHX172C

## 2014-10-23 LAB — POCT I-STAT, CHEM 8
BUN: 23 mg/dL — AB (ref 6–20)
Calcium, Ion: 1.21 mmol/L (ref 1.13–1.30)
Chloride: 99 mmol/L — ABNORMAL LOW (ref 101–111)
Creatinine, Ser: 3.4 mg/dL — ABNORMAL HIGH (ref 0.61–1.24)
GLUCOSE: 116 mg/dL — AB (ref 65–99)
HCT: 40 % (ref 39.0–52.0)
HEMOGLOBIN: 13.6 g/dL (ref 13.0–17.0)
POTASSIUM: 3.4 mmol/L — AB (ref 3.5–5.1)
SODIUM: 141 mmol/L (ref 135–145)
TCO2: 29 mmol/L (ref 0–100)

## 2014-10-23 LAB — GLUCOSE, CAPILLARY: Glucose-Capillary: 103 mg/dL — ABNORMAL HIGH (ref 65–99)

## 2014-10-23 SURGERY — A/V SHUNTOGRAM/FISTULAGRAM
Laterality: Left

## 2014-10-23 MED ORDER — HEPARIN (PORCINE) IN NACL 2-0.9 UNIT/ML-% IJ SOLN
INTRAMUSCULAR | Status: AC
  Filled 2014-10-23: qty 500

## 2014-10-23 MED ORDER — LIDOCAINE HCL (PF) 1 % IJ SOLN
INTRAMUSCULAR | Status: DC | PRN
  Administered 2014-10-23: 3 mL

## 2014-10-23 MED ORDER — SODIUM CHLORIDE 0.9 % IV SOLN: 250.0000 mL | INTRAVENOUS | Status: DC | PRN

## 2014-10-23 MED ORDER — MIDAZOLAM HCL 2 MG/2ML IJ SOLN
INTRAMUSCULAR | Status: AC
  Filled 2014-10-23: qty 4

## 2014-10-23 MED ORDER — SODIUM CHLORIDE 0.9 % IJ SOLN: 3.0000 mL | INTRAMUSCULAR | Status: DC | PRN

## 2014-10-23 MED ORDER — FENTANYL CITRATE (PF) 100 MCG/2ML IJ SOLN
INTRAMUSCULAR | Status: DC | PRN
  Administered 2014-10-23: 25 ug via INTRAVENOUS

## 2014-10-23 MED ORDER — LIDOCAINE HCL (PF) 1 % IJ SOLN
INTRAMUSCULAR | Status: AC
  Filled 2014-10-23: qty 30

## 2014-10-23 MED ORDER — FENTANYL CITRATE (PF) 100 MCG/2ML IJ SOLN
INTRAMUSCULAR | Status: AC
  Filled 2014-10-23: qty 4

## 2014-10-23 MED ORDER — SODIUM CHLORIDE 0.9 % IJ SOLN: 3.0000 mL | Freq: Two times a day (BID) | INTRAMUSCULAR | Status: DC

## 2014-10-23 MED ORDER — ACETAMINOPHEN 325 MG PO TABS: 650.0000 mg | ORAL_TABLET | ORAL | Status: DC | PRN

## 2014-10-23 MED ORDER — MIDAZOLAM HCL 2 MG/2ML IJ SOLN
INTRAMUSCULAR | Status: DC | PRN
  Administered 2014-10-23: 0.5 mg via INTRAVENOUS

## 2014-10-23 SURGICAL SUPPLY — 7 items
COVER DOME SNAP 22 D (MISCELLANEOUS) ×2 IMPLANT
COVER PRB 48X5XTLSCP FOLD TPE (BAG) IMPLANT
COVER PROBE 5X48 (BAG) ×3
KIT MICROINTRODUCER STIFF 5F (SHEATH) ×2 IMPLANT
STOPCOCK MORSE 400PSI 3WAY (MISCELLANEOUS) ×2 IMPLANT
TRAY PV CATH (CUSTOM PROCEDURE TRAY) ×3 IMPLANT
TUBING CIL FLEX 10 FLL-RA (TUBING) ×2 IMPLANT

## 2014-10-23 NOTE — Interval H&P Note (Signed)
[facility]   History and Physical Update  The patient was interviewed and re-examined.  The patient's previous History and Physical has been reviewed and is unchanged from Dr. Nona Dell consult.  There is no change in the plan of care: L arm fistulogram, possible intervention.  Adele Barthel, MD [facility] Office: [phone] Pager: [phone]  10/23/2014, 7:09 AM

## 2014-10-23 NOTE — H&P (View-Only) (Signed)
Established Dialysis Access  History of Present Illness  Gregory Rangel is a 81 y.o. (28-Dec-1933) male patient of Dr. Scot Dock who comes in today, advised by his HD center to evaluate non functioning AV fistula. He underwent a left basilic vein transposition on 06/10/2014 and started using this access for HD on September 15, 2014. He dialyzes M-W-F. His last dialysis was Monday, August 1. HD access attempted Wednesday August 3, but wife states the technicians were unable to access his left upper arm AVF with several attempts. He last saw Dr. Scot Dock on 07/30/14 for 6 weeks follow up. At that time duplex of his fistula showed that the diameters of the fistula ranged from 0.54-0.65 cm, considered  very reasonable. Pt states he feels OK, does not c/o dyspnea. Pt denies pain, weakness, cold feeling, or pain in left hand. He does report intermittent numbness in left 4th and 5th fingers.    Past Medical History  Diagnosis Date  . Hypertension   . Shortness of breath     with exertion  . Diabetes mellitus without complication   . GERD (gastroesophageal reflux disease)   . Arthritis   . DVT (deep venous thrombosis)   . Varicose veins   . Chronic kidney disease   . Anemia   . Cancer     colon cancer and prostate    Social History History  Substance Use Topics  . Smoking status: Former Smoker    Quit date: 06/28/1983  . Smokeless tobacco: Former Systems developer  . Alcohol Use: No    Family History History reviewed. No pertinent family history.  Surgical History Past Surgical History  Procedure Laterality Date  . Back surgery      x 2  . Tonsillectomy    . Appendectomy    . Cholecystectomy    . Eye surgery      right eye with lens implant  . Right knee arthroscopy    . Total knee arthroplasty Right 07/06/2012    Procedure: RIGHT TOTAL KNEE ARTHROPLASTY;  Surgeon: Johnn Hai, MD;  Location: [facility];  Service: Orthopedics;  Laterality: Right;  . Esophagogastroduodenoscopy (egd) with  esophageal dilation N/A 08/09/2012    Procedure: ESOPHAGOGASTRODUODENOSCOPY (EGD) WITH ESOPHAGEAL DILATION;  Surgeon: Rogene Houston, MD;  [facility];  Service: Endoscopy;  Laterality: N/A;  155-moved to 830 Ann to notify pt  . Colon surgery    . Endovenous ablation saphenous vein w/ laser Right 04-25-2013    right greater saphenous vein Curt Jews MD  . Cataract extraction w/phaco Left 11/05/2013    Procedure: CATARACT EXTRACTION PHACO AND INTRAOCULAR LENS PLACEMENT ([place]);  Surgeon: Elta Guadeloupe T. Gershon Crane, MD;  Location: [facility];  Service: Ophthalmology;  Laterality: Left;  CDE: 22.81  . Prostate surgery    . Colonoscopy    . Bascilic vein transposition Left 06/10/2014    Procedure: LEFT FIRST STAGE BASILIC VEIN TRANSPOSITION ;  Surgeon: Angelia Mould, MD;  Location: [facility];  Service: Vascular;  Laterality: Left;    Allergies  Allergen Reactions  . Penicillins Hives, Swelling and Rash    Current Outpatient Prescriptions  Medication Sig Dispense Refill  . acetaminophen (TYLENOL) 500 MG tablet Take 1,000 mg by mouth every 6 (six) hours as needed for mild pain, fever or headache.    . ALPRAZolam (XANAX) 0.5 MG tablet Take 0.5 mg by mouth at bedtime as needed for sleep.    Marland Kitchen amLODipine (NORVASC) 10 MG tablet Take 10 mg by mouth  daily.     . benazepril (LOTENSIN) 40 MG tablet Take 40 mg by mouth daily.     . calcitRIOL (ROCALTROL) 0.5 MCG capsule Take 0.5 mcg by mouth daily.    . cholecalciferol (VITAMIN D) 1000 UNITS tablet Take 1,000 Units by mouth daily.    Marland Kitchen glipiZIDE (GLUCOTROL XL) 10 MG 24 hr tablet Take 10 mg by mouth every morning.    . iron polysaccharides (NIFEREX) 150 MG capsule Take 150 mg by mouth daily.    . pantoprazole (PROTONIX) 40 MG tablet TAKE ONE TABLET BY MOUTH ONCE DAILY. 30 tablet 5  . sodium bicarbonate 650 MG tablet Take 650 mg by mouth 2 (two) times daily.    Marland Kitchen oxyCODONE (ROXICODONE) 5 MG immediate release tablet Take 1 tablet (5 mg total) by mouth every  6 (six) hours as needed. (Patient not taking: Reported on 10/16/2014) 30 tablet 0   No current facility-administered medications for this visit.     REVIEW OF SYSTEMS: see HPI for pertinent positives and negatives    PHYSICAL EXAMINATION:  Filed Vitals:   10/16/14 1520  BP: 117/64  Pulse: 76  Temp: 98.1 F (36.7 C)  TempSrc: Oral  Resp: 16  Height: 6' (1.829 m)  Weight: 211 lb (95.709 kg)  SpO2: 100%   Body mass index is 28.61 kg/(m^2).  General: The patient appears their stated age.   HEENT:  No gross abnormalities Pulmonary: Respirations are non-labored Abdomen: Soft and non-tender. Musculoskeletal: There are no major deformities, walks with a cane   Neurologic: No focal weakness or paresthesias are detected. Bilateral hand grip is 5/5. He is somewhat hard of hearing. Skin: There are no ulcer or rashes noted. Psychiatric: The patient has normal affect. Cardiovascular: There is a regular rate and rhythm. Both radial pulses are palpable. Left upper arm AV fistula has a palpable thrill.  Non-Invasive Vascular Imaging (10/16/2014):  DIALYSIS FISTULA DUPLEX EVALUATION    INDICATION: Upper arm arteriovenous fistula     PREVIOUS INTERVENTION(S): Left basilic vein transposition on 06/10/14    DUPLEX EXAM:     RIGHT  LEFT    Peak Systolic Velocities (cm/s) Ratio LOCATION Peak Systolic Velocities (cm/s)  Ratio        Inflow Artery 272         Arterio-Venous Anastomosis 466     Diameter (cm) Depth (cm)  Outflow Vein  Diameter (cm) Depth (cm)       Axilla  Vein 113  1.2 1.3       Prox. Brachium  Vein 162/502 3.1 1.16/0.52 0.56       Mid Brachium  Vein 197  0.78 0.28      Distal Brachium   Vein 466/674  0.63 0.37    Diameter (cm) Branch Location Diameter (cm)            ADDITIONAL FINDINGS: . No residual lumen of less than 79mm was noted in the outflow vein or anastomosis. . Velocity ratio of greater than 3 noted in the left proximal brachium outflow vein which appears  to be due to a change in vessel diameter from 1.2 to 0.52cm with a frozen valve leaflet versus minimally-occlusive, chronic thrombus also visualized. . Frozen valve leaflet noted in the left distal brachium level outflow vein. Marland Kitchen Antegrade, biphasic flow noted in the left distal radial artery.     IMPRESSION: Patent left arm arteriovenous fistula with findings noted above.     Medical Decision Making  Gregory Rangel is a 81  y.o. male who comes in today, advised by his HD center to evaluate non functioning AV fistula. He underwent a left basilic vein transposition on 06/10/2014 and started using this access for HD on September 15, 2014. He dialyzes M-W-F. His last dialysis was Monday, August 1. HD access attempted Wednesday August 3, but wife states the technicians were unable to access his left upper arm AVF with several attempts. He last saw Dr. Scot Dock on 07/30/14 for 6 weeks follow up. At that time duplex of his fistula showed that the diameters of the fistula ranged from 0.54-0.65 cm, considered  very reasonable. Pt states he feels OK, does not c/o dyspnea. Pt denies pain, weakness, cold feeling, or pain in left hand. He does report intermittent numbness in left 4th and 5th fingers.   Discussed pt HPI and HD Duplex result with Dr. Oneida Alar who examined pt and spoke with pt and wife. Pt will be scheduled for Diatek insertion tomorrow and schedule HD in 2 days. He will also be scheduled for a fistulagram with possible intervention by Dr. Bridgett Larsson on 10/23/14.   Vonna Brabson, Sharmon Leyden, RN, MSN, FNP-C [facility] [place] Office: [phone]  10/16/2014, 3:57 PM  Clinic MD: Oneida Alar

## 2014-10-23 NOTE — Discharge Instructions (Signed)
Fistulogram, Care After °Refer to this sheet in the next few weeks. These instructions provide you with information on caring for yourself after your procedure. Your health care provider may also give you more specific instructions. Your treatment has been planned according to current medical practices, but problems sometimes occur. Call your health care provider if you have any problems or questions after your procedure. °WHAT TO EXPECT AFTER THE PROCEDURE °After your procedure, it is typical to have the following: °· A small amount of discomfort in the area where the catheters were placed. °· A small amount of bruising around the fistula. °· Sleepiness and fatigue. °HOME CARE INSTRUCTIONS °· Rest at home for the day following your procedure. °· Do not drive or operate heavy machinery while taking pain medicine. °· Take medicines only as directed by your health care provider. °· Do not take baths, swim, or use a hot tub until your health care provider approves. You may shower 24 hours after the procedure or as directed by your health care provider. °· There are many different ways to close and cover an incision, including stitches, skin glue, and adhesive strips. Follow your health care provider's instructions on: °¨ Incision care. °¨ Bandage (dressing) changes and removal. °¨ Incision closure removal. °· Monitor your dialysis fistula carefully. °SEEK MEDICAL CARE IF: °· You have drainage, redness, swelling, or pain at your catheter site. °· You have a fever. °· You have chills. °SEEK IMMEDIATE MEDICAL CARE IF: °· You feel weak. °· You have trouble balancing. °· You have trouble moving your arms or legs. °· You have problems with your speech or vision. °· You can no longer feel a vibration or buzz when you put your fingers over your dialysis fistula. °· The limb that was used for the procedure: °¨ Swells. °¨ Is painful. °¨ Is cold. °¨ Is discolored, such as blue or pale white. °Document Released: 07/15/2013  Document Reviewed: 04/19/2013 °[employer]® Patient Information ©2015 [employer]. This information is not intended to replace advice given to you by your health care provider. Make sure you discuss any questions you have with your health care provider. ° °

## 2014-10-24 DIAGNOSIS — N186 End stage renal disease: Secondary | ICD-10-CM | POA: Diagnosis not present

## 2014-10-24 DIAGNOSIS — D631 Anemia in chronic kidney disease: Secondary | ICD-10-CM | POA: Diagnosis not present

## 2014-10-24 DIAGNOSIS — D509 Iron deficiency anemia, unspecified: Secondary | ICD-10-CM | POA: Diagnosis not present

## 2014-10-24 DIAGNOSIS — Z992 Dependence on renal dialysis: Secondary | ICD-10-CM | POA: Diagnosis not present

## 2014-10-27 DIAGNOSIS — D631 Anemia in chronic kidney disease: Secondary | ICD-10-CM | POA: Diagnosis not present

## 2014-10-27 DIAGNOSIS — D509 Iron deficiency anemia, unspecified: Secondary | ICD-10-CM | POA: Diagnosis not present

## 2014-10-27 DIAGNOSIS — Z992 Dependence on renal dialysis: Secondary | ICD-10-CM | POA: Diagnosis not present

## 2014-10-27 DIAGNOSIS — N186 End stage renal disease: Secondary | ICD-10-CM | POA: Diagnosis not present

## 2014-10-29 DIAGNOSIS — D631 Anemia in chronic kidney disease: Secondary | ICD-10-CM | POA: Diagnosis not present

## 2014-10-29 DIAGNOSIS — Z992 Dependence on renal dialysis: Secondary | ICD-10-CM | POA: Diagnosis not present

## 2014-10-29 DIAGNOSIS — D509 Iron deficiency anemia, unspecified: Secondary | ICD-10-CM | POA: Diagnosis not present

## 2014-10-29 DIAGNOSIS — N186 End stage renal disease: Secondary | ICD-10-CM | POA: Diagnosis not present

## 2014-10-31 DIAGNOSIS — Z992 Dependence on renal dialysis: Secondary | ICD-10-CM | POA: Diagnosis not present

## 2014-10-31 DIAGNOSIS — N186 End stage renal disease: Secondary | ICD-10-CM | POA: Diagnosis not present

## 2014-10-31 DIAGNOSIS — D631 Anemia in chronic kidney disease: Secondary | ICD-10-CM | POA: Diagnosis not present

## 2014-10-31 DIAGNOSIS — D509 Iron deficiency anemia, unspecified: Secondary | ICD-10-CM | POA: Diagnosis not present

## 2014-11-03 DIAGNOSIS — D631 Anemia in chronic kidney disease: Secondary | ICD-10-CM | POA: Diagnosis not present

## 2014-11-03 DIAGNOSIS — Z992 Dependence on renal dialysis: Secondary | ICD-10-CM | POA: Diagnosis not present

## 2014-11-03 DIAGNOSIS — N186 End stage renal disease: Secondary | ICD-10-CM | POA: Diagnosis not present

## 2014-11-03 DIAGNOSIS — D509 Iron deficiency anemia, unspecified: Secondary | ICD-10-CM | POA: Diagnosis not present

## 2014-11-05 DIAGNOSIS — N186 End stage renal disease: Secondary | ICD-10-CM | POA: Diagnosis not present

## 2014-11-05 DIAGNOSIS — D509 Iron deficiency anemia, unspecified: Secondary | ICD-10-CM | POA: Diagnosis not present

## 2014-11-05 DIAGNOSIS — D631 Anemia in chronic kidney disease: Secondary | ICD-10-CM | POA: Diagnosis not present

## 2014-11-05 DIAGNOSIS — Z992 Dependence on renal dialysis: Secondary | ICD-10-CM | POA: Diagnosis not present

## 2014-11-07 DIAGNOSIS — Z992 Dependence on renal dialysis: Secondary | ICD-10-CM | POA: Diagnosis not present

## 2014-11-07 DIAGNOSIS — N186 End stage renal disease: Secondary | ICD-10-CM | POA: Diagnosis not present

## 2014-11-07 DIAGNOSIS — D631 Anemia in chronic kidney disease: Secondary | ICD-10-CM | POA: Diagnosis not present

## 2014-11-07 DIAGNOSIS — D509 Iron deficiency anemia, unspecified: Secondary | ICD-10-CM | POA: Diagnosis not present

## 2014-11-10 DIAGNOSIS — Z992 Dependence on renal dialysis: Secondary | ICD-10-CM | POA: Diagnosis not present

## 2014-11-10 DIAGNOSIS — N186 End stage renal disease: Secondary | ICD-10-CM | POA: Diagnosis not present

## 2014-11-10 DIAGNOSIS — D631 Anemia in chronic kidney disease: Secondary | ICD-10-CM | POA: Diagnosis not present

## 2014-11-10 DIAGNOSIS — D509 Iron deficiency anemia, unspecified: Secondary | ICD-10-CM | POA: Diagnosis not present

## 2014-11-11 DIAGNOSIS — Z452 Encounter for adjustment and management of vascular access device: Secondary | ICD-10-CM | POA: Diagnosis not present

## 2014-11-12 DIAGNOSIS — Z992 Dependence on renal dialysis: Secondary | ICD-10-CM | POA: Diagnosis not present

## 2014-11-12 DIAGNOSIS — D631 Anemia in chronic kidney disease: Secondary | ICD-10-CM | POA: Diagnosis not present

## 2014-11-12 DIAGNOSIS — D509 Iron deficiency anemia, unspecified: Secondary | ICD-10-CM | POA: Diagnosis not present

## 2014-11-12 DIAGNOSIS — N186 End stage renal disease: Secondary | ICD-10-CM | POA: Diagnosis not present

## 2014-11-13 ENCOUNTER — Other Ambulatory Visit (INDEPENDENT_AMBULATORY_CARE_PROVIDER_SITE_OTHER): Payer: Self-pay | Admitting: Internal Medicine

## 2014-11-14 DIAGNOSIS — D631 Anemia in chronic kidney disease: Secondary | ICD-10-CM | POA: Diagnosis not present

## 2014-11-14 DIAGNOSIS — N186 End stage renal disease: Secondary | ICD-10-CM | POA: Diagnosis not present

## 2014-11-14 DIAGNOSIS — Z992 Dependence on renal dialysis: Secondary | ICD-10-CM | POA: Diagnosis not present

## 2014-11-14 DIAGNOSIS — D509 Iron deficiency anemia, unspecified: Secondary | ICD-10-CM | POA: Diagnosis not present

## 2014-11-17 DIAGNOSIS — D509 Iron deficiency anemia, unspecified: Secondary | ICD-10-CM | POA: Diagnosis not present

## 2014-11-17 DIAGNOSIS — N186 End stage renal disease: Secondary | ICD-10-CM | POA: Diagnosis not present

## 2014-11-17 DIAGNOSIS — Z992 Dependence on renal dialysis: Secondary | ICD-10-CM | POA: Diagnosis not present

## 2014-11-17 DIAGNOSIS — D631 Anemia in chronic kidney disease: Secondary | ICD-10-CM | POA: Diagnosis not present

## 2014-11-19 DIAGNOSIS — N186 End stage renal disease: Secondary | ICD-10-CM | POA: Diagnosis not present

## 2014-11-19 DIAGNOSIS — D631 Anemia in chronic kidney disease: Secondary | ICD-10-CM | POA: Diagnosis not present

## 2014-11-19 DIAGNOSIS — Z992 Dependence on renal dialysis: Secondary | ICD-10-CM | POA: Diagnosis not present

## 2014-11-19 DIAGNOSIS — D509 Iron deficiency anemia, unspecified: Secondary | ICD-10-CM | POA: Diagnosis not present

## 2014-11-21 DIAGNOSIS — D509 Iron deficiency anemia, unspecified: Secondary | ICD-10-CM | POA: Diagnosis not present

## 2014-11-21 DIAGNOSIS — D631 Anemia in chronic kidney disease: Secondary | ICD-10-CM | POA: Diagnosis not present

## 2014-11-21 DIAGNOSIS — Z992 Dependence on renal dialysis: Secondary | ICD-10-CM | POA: Diagnosis not present

## 2014-11-21 DIAGNOSIS — N186 End stage renal disease: Secondary | ICD-10-CM | POA: Diagnosis not present

## 2014-11-24 DIAGNOSIS — N186 End stage renal disease: Secondary | ICD-10-CM | POA: Diagnosis not present

## 2014-11-24 DIAGNOSIS — Z992 Dependence on renal dialysis: Secondary | ICD-10-CM | POA: Diagnosis not present

## 2014-11-24 DIAGNOSIS — D509 Iron deficiency anemia, unspecified: Secondary | ICD-10-CM | POA: Diagnosis not present

## 2014-11-24 DIAGNOSIS — D631 Anemia in chronic kidney disease: Secondary | ICD-10-CM | POA: Diagnosis not present

## 2014-11-25 DIAGNOSIS — G2581 Restless legs syndrome: Secondary | ICD-10-CM | POA: Diagnosis not present

## 2014-11-25 DIAGNOSIS — N185 Chronic kidney disease, stage 5: Secondary | ICD-10-CM | POA: Diagnosis not present

## 2014-11-25 DIAGNOSIS — M545 Low back pain: Secondary | ICD-10-CM | POA: Diagnosis not present

## 2014-11-25 DIAGNOSIS — E1121 Type 2 diabetes mellitus with diabetic nephropathy: Secondary | ICD-10-CM | POA: Diagnosis not present

## 2014-11-26 DIAGNOSIS — N186 End stage renal disease: Secondary | ICD-10-CM | POA: Diagnosis not present

## 2014-11-26 DIAGNOSIS — D631 Anemia in chronic kidney disease: Secondary | ICD-10-CM | POA: Diagnosis not present

## 2014-11-26 DIAGNOSIS — Z992 Dependence on renal dialysis: Secondary | ICD-10-CM | POA: Diagnosis not present

## 2014-11-26 DIAGNOSIS — D509 Iron deficiency anemia, unspecified: Secondary | ICD-10-CM | POA: Diagnosis not present

## 2014-11-28 DIAGNOSIS — Z992 Dependence on renal dialysis: Secondary | ICD-10-CM | POA: Diagnosis not present

## 2014-11-28 DIAGNOSIS — N186 End stage renal disease: Secondary | ICD-10-CM | POA: Diagnosis not present

## 2014-11-28 DIAGNOSIS — D631 Anemia in chronic kidney disease: Secondary | ICD-10-CM | POA: Diagnosis not present

## 2014-11-28 DIAGNOSIS — D509 Iron deficiency anemia, unspecified: Secondary | ICD-10-CM | POA: Diagnosis not present

## 2014-12-01 DIAGNOSIS — D509 Iron deficiency anemia, unspecified: Secondary | ICD-10-CM | POA: Diagnosis not present

## 2014-12-01 DIAGNOSIS — D631 Anemia in chronic kidney disease: Secondary | ICD-10-CM | POA: Diagnosis not present

## 2014-12-01 DIAGNOSIS — N186 End stage renal disease: Secondary | ICD-10-CM | POA: Diagnosis not present

## 2014-12-01 DIAGNOSIS — Z992 Dependence on renal dialysis: Secondary | ICD-10-CM | POA: Diagnosis not present

## 2014-12-02 ENCOUNTER — Ambulatory Visit (INDEPENDENT_AMBULATORY_CARE_PROVIDER_SITE_OTHER): Payer: Medicare Other | Admitting: Internal Medicine

## 2014-12-03 DIAGNOSIS — D631 Anemia in chronic kidney disease: Secondary | ICD-10-CM | POA: Diagnosis not present

## 2014-12-03 DIAGNOSIS — N186 End stage renal disease: Secondary | ICD-10-CM | POA: Diagnosis not present

## 2014-12-03 DIAGNOSIS — D509 Iron deficiency anemia, unspecified: Secondary | ICD-10-CM | POA: Diagnosis not present

## 2014-12-03 DIAGNOSIS — Z992 Dependence on renal dialysis: Secondary | ICD-10-CM | POA: Diagnosis not present

## 2014-12-05 DIAGNOSIS — D509 Iron deficiency anemia, unspecified: Secondary | ICD-10-CM | POA: Diagnosis not present

## 2014-12-05 DIAGNOSIS — Z992 Dependence on renal dialysis: Secondary | ICD-10-CM | POA: Diagnosis not present

## 2014-12-05 DIAGNOSIS — D631 Anemia in chronic kidney disease: Secondary | ICD-10-CM | POA: Diagnosis not present

## 2014-12-05 DIAGNOSIS — N186 End stage renal disease: Secondary | ICD-10-CM | POA: Diagnosis not present

## 2014-12-08 ENCOUNTER — Ambulatory Visit (INDEPENDENT_AMBULATORY_CARE_PROVIDER_SITE_OTHER): Payer: Medicare Other | Admitting: Internal Medicine

## 2014-12-08 DIAGNOSIS — D509 Iron deficiency anemia, unspecified: Secondary | ICD-10-CM | POA: Diagnosis not present

## 2014-12-08 DIAGNOSIS — Z992 Dependence on renal dialysis: Secondary | ICD-10-CM | POA: Diagnosis not present

## 2014-12-08 DIAGNOSIS — D631 Anemia in chronic kidney disease: Secondary | ICD-10-CM | POA: Diagnosis not present

## 2014-12-08 DIAGNOSIS — N186 End stage renal disease: Secondary | ICD-10-CM | POA: Diagnosis not present

## 2014-12-10 DIAGNOSIS — N186 End stage renal disease: Secondary | ICD-10-CM | POA: Diagnosis not present

## 2014-12-10 DIAGNOSIS — D631 Anemia in chronic kidney disease: Secondary | ICD-10-CM | POA: Diagnosis not present

## 2014-12-10 DIAGNOSIS — D509 Iron deficiency anemia, unspecified: Secondary | ICD-10-CM | POA: Diagnosis not present

## 2014-12-10 DIAGNOSIS — Z992 Dependence on renal dialysis: Secondary | ICD-10-CM | POA: Diagnosis not present

## 2014-12-12 DIAGNOSIS — Z992 Dependence on renal dialysis: Secondary | ICD-10-CM | POA: Diagnosis not present

## 2014-12-12 DIAGNOSIS — D509 Iron deficiency anemia, unspecified: Secondary | ICD-10-CM | POA: Diagnosis not present

## 2014-12-12 DIAGNOSIS — D631 Anemia in chronic kidney disease: Secondary | ICD-10-CM | POA: Diagnosis not present

## 2014-12-12 DIAGNOSIS — N186 End stage renal disease: Secondary | ICD-10-CM | POA: Diagnosis not present

## 2014-12-15 DIAGNOSIS — N2581 Secondary hyperparathyroidism of renal origin: Secondary | ICD-10-CM | POA: Diagnosis not present

## 2014-12-15 DIAGNOSIS — D631 Anemia in chronic kidney disease: Secondary | ICD-10-CM | POA: Diagnosis not present

## 2014-12-15 DIAGNOSIS — D509 Iron deficiency anemia, unspecified: Secondary | ICD-10-CM | POA: Diagnosis not present

## 2014-12-15 DIAGNOSIS — Z992 Dependence on renal dialysis: Secondary | ICD-10-CM | POA: Diagnosis not present

## 2014-12-15 DIAGNOSIS — N186 End stage renal disease: Secondary | ICD-10-CM | POA: Diagnosis not present

## 2014-12-17 DIAGNOSIS — Z992 Dependence on renal dialysis: Secondary | ICD-10-CM | POA: Diagnosis not present

## 2014-12-17 DIAGNOSIS — N2581 Secondary hyperparathyroidism of renal origin: Secondary | ICD-10-CM | POA: Diagnosis not present

## 2014-12-17 DIAGNOSIS — D631 Anemia in chronic kidney disease: Secondary | ICD-10-CM | POA: Diagnosis not present

## 2014-12-17 DIAGNOSIS — D509 Iron deficiency anemia, unspecified: Secondary | ICD-10-CM | POA: Diagnosis not present

## 2014-12-17 DIAGNOSIS — N186 End stage renal disease: Secondary | ICD-10-CM | POA: Diagnosis not present

## 2014-12-18 ENCOUNTER — Ambulatory Visit (INDEPENDENT_AMBULATORY_CARE_PROVIDER_SITE_OTHER): Payer: Medicare Other | Admitting: Internal Medicine

## 2014-12-18 ENCOUNTER — Encounter (INDEPENDENT_AMBULATORY_CARE_PROVIDER_SITE_OTHER): Payer: Self-pay | Admitting: Internal Medicine

## 2014-12-18 VITALS — BP 120/70 | HR 66 | Temp 98.3°F | Resp 18 | Ht 72.0 in | Wt 203.9 lb

## 2014-12-18 DIAGNOSIS — D509 Iron deficiency anemia, unspecified: Secondary | ICD-10-CM

## 2014-12-18 DIAGNOSIS — Z85038 Personal history of other malignant neoplasm of large intestine: Secondary | ICD-10-CM | POA: Diagnosis not present

## 2014-12-18 DIAGNOSIS — K2100 Gastro-esophageal reflux disease with esophagitis, without bleeding: Secondary | ICD-10-CM

## 2014-12-18 NOTE — Progress Notes (Signed)
Presenting complaint;  Follow-up iron deficiency anemia. History of GERD and esophageal stricture.  Subjective:  Gregory Rangel is 81 year old Caucasian male who is here for scheduled visit. He was last seen on 05/26/2014 for iron deficiency anemia. He has history of colon carcinoma status post right hemicolectomy in for pre-1998 and he also had tubular adenoma removed with dysplasia and February 2005 and his last colonoscopy was in April 2009 and was negative for polyps. He was advised colonoscopy but he was having problems with chronic kidney disease and wanted to wait. He denies melena or rectal bleeding or hematuria. He has been taking by mouth iron and serum iron has, although his hemoglobin has ranged between 11 and 12. He does not take OTC NSAIDs. He says heartburns well controlled. He has history of esophageal stricture which was last dilated in May 2014. He states he has not had any difficulty swallowing since then. He has lost 29 pounds since his last visit. His wife states most of weight loss occurred just before he went on hemodialysis but he has lost 6 or 7 pounds in one month. He has chronic low back pain. He states his pain gets worse every time he has to sit in the chair for over 3 hours. He believes he has good appetite. He denies sore throat or hoarseness or chronic cough. His bowels move daily. Every now and then he feels he is constipated and he would take a Dulcolax 1 tablet once or twice a month.    Current Medications: Outpatient Encounter Prescriptions as of 12/18/2014  Medication Sig  . acetaminophen (TYLENOL) 500 MG tablet Take 1,000 mg by mouth every 6 (six) hours as needed for mild pain, fever or headache.  . ALPRAZolam (XANAX) 0.5 MG tablet Take 0.5 mg by mouth at bedtime as needed for sleep.  . calcitRIOL (ROCALTROL) 0.5 MCG capsule Take 0.5 mcg by mouth daily.  . cholecalciferol (VITAMIN D) 1000 UNITS tablet Take 1,000 Units by mouth daily.  . cinacalcet (SENSIPAR) 30 MG  tablet Take 30 mg by mouth daily.  Marland Kitchen glipiZIDE (GLUCOTROL XL) 10 MG 24 hr tablet Take 10 mg by mouth every morning.  . iron polysaccharides (NIFEREX) 150 MG capsule Take 150 mg by mouth 2 (two) times daily.   . multivitamin (RENA-VIT) TABS tablet Take 1 tablet by mouth daily.  . pantoprazole (PROTONIX) 40 MG tablet TAKE ONE TABLET BY MOUTH ONCE DAILY.  Marland Kitchen rOPINIRole (REQUIP) 0.5 MG tablet Take 0.5 mg by mouth daily.  . sevelamer carbonate (RENVELA) 800 MG tablet Take 800 mg by mouth 3 (three) times daily with meals.  . sodium bicarbonate 650 MG tablet Take 650 mg by mouth 2 (two) times daily.  . [DISCONTINUED] amLODipine (NORVASC) 10 MG tablet Take 10 mg by mouth daily.    No facility-administered encounter medications on file as of 12/18/2014.   Past Medical History  Diagnosis Date  . Hypertension   .        Marland Kitchen Diabetes mellitus without complication ([place])   . GERD (gastroesophageal reflux disease)   . Arthritis   . DVT (deep venous thrombosis) ([place])   . Varicose veins   . Chronic kidney disease patient has been on dialysis since 09/15/2014    . Anemia   . Cancer [facility])     colon cancer and prostate  . Kidney stone    Past Surgical History  Procedure Laterality Date  . Back surgery      x 2  . Tonsillectomy    .  Appendectomy    . Cholecystectomy    . Eye surgery      right eye with lens implant  . Right knee arthroscopy    . Total knee arthroplasty Right 07/06/2012    Procedure: RIGHT TOTAL KNEE ARTHROPLASTY;  Surgeon: Johnn Hai, MD;  Location: [facility];  Service: Orthopedics;  Laterality: Right;  . Esophagogastroduodenoscopy (egd) with esophageal dilation N/A 08/09/2012    Procedure: ESOPHAGOGASTRODUODENOSCOPY (EGD) WITH ESOPHAGEAL DILATION;  Surgeon: Rogene Houston, MD;  Location: [facility];  Service: Endoscopy;  Laterality: N/A;  155-moved to 830 Ann to notify pt  . Colon surgery    . Endovenous ablation saphenous vein w/ laser Right 04-25-2013    right greater  saphenous vein Curt Jews MD  . Cataract extraction w/phaco Left 11/05/2013    Procedure: CATARACT EXTRACTION PHACO AND INTRAOCULAR LENS PLACEMENT ([place]);  Surgeon: Elta Guadeloupe T. Gershon Crane, MD;  Location: [facility];  Service: Ophthalmology;  Laterality: Left;  CDE: 22.81  . Prostate surgery    . Colonoscopy    . Bascilic vein transposition Left 06/10/2014    Procedure: LEFT FIRST STAGE BASILIC VEIN TRANSPOSITION ;  Surgeon: Angelia Mould, MD;  Location: [place];  Service: Vascular;  Laterality: Left;  . Insertion of dialysis catheter Right 10/17/2014    Procedure: INSERTION OF DIALYSIS CATHETER;  Surgeon: Rosetta Posner, MD;  Location: [place];  Service: Vascular;  Laterality: Right;  . Peripheral vascular catheterization Left 10/23/2014    Procedure: Fistulagram;  Surgeon: Conrad Blanford, MD;  Location: [place] [facility];  Service: Cardiovascular;  Laterality: Left;  . Ultrasound guidance for vascular access Left 10/23/2014    Procedure: Ultrasound Guidance For Vascular Access;  Surgeon: Conrad McCracken, MD;  Location: [place] [facility];  Service: Cardiovascular;  Laterality: Left;     Objective: Blood pressure 120/70, pulse 66, temperature 98.3 F (36.8 C), temperature source Oral, resp. rate 18, height 6' (1.829 m), weight 203 lb 14.4 oz (92.488 kg). Patient is alert and in no acute distress. Conjunctiva is pink. Sclera is nonicteric Oropharyngeal mucosa is normal. No neck masses or thyromegaly noted. Cardiac exam with regular rhythm normal S1 and S2. He has faint systolic ejection murmur at left sternal border. Lungs are clear to auscultation. Abdomen is full but soft and nontender without organomegaly or masses. No LE edema or clubbing noted. He has fluid thrill over AV fistula at left arm.  Labs/studies Results:  Lab data from 10/19/2016  H&H 11.1 and 35.7 Serum iron 28 TIBC 219 and saturation 13% Serum ferritin 158.    lab data from 11/24/2014 H&H 12.2 and 39.1 Serum iron 36 TIBC 225 and  saturation 16% Serum ferritin 276  Lab data from 12/08/2014 H&H 11.7 and 35.1   Assessment:  #1. Anemia. Iron studies suggest iron deficiency anemia but I guess he also has anemia of chronic disease. It does not appear that he has responded to by mouth iron. He has history of colon carcinoma and last colonoscopy was in April 2009 and he is long overdue for surveillance colonoscopy. #2. GERD complicated by distal esophageal stricture. Heartburns well controlled with PPI and he has been without dysphagia since last dilation of May 2014.    Plan:  Hemoccult 1 Patient encouraged to consider colonoscopy. He has some concerns about taking the prep cc on dialysis 3 times a week. He can be prepped and undergo colonoscopy on a day when does not have to undergo hemodialysis.

## 2014-12-18 NOTE — Patient Instructions (Addendum)
Hemoccult x 1 

## 2014-12-19 DIAGNOSIS — D509 Iron deficiency anemia, unspecified: Secondary | ICD-10-CM | POA: Diagnosis not present

## 2014-12-19 DIAGNOSIS — Z992 Dependence on renal dialysis: Secondary | ICD-10-CM | POA: Diagnosis not present

## 2014-12-19 DIAGNOSIS — D631 Anemia in chronic kidney disease: Secondary | ICD-10-CM | POA: Diagnosis not present

## 2014-12-19 DIAGNOSIS — N2581 Secondary hyperparathyroidism of renal origin: Secondary | ICD-10-CM | POA: Diagnosis not present

## 2014-12-19 DIAGNOSIS — N186 End stage renal disease: Secondary | ICD-10-CM | POA: Diagnosis not present

## 2014-12-22 DIAGNOSIS — D631 Anemia in chronic kidney disease: Secondary | ICD-10-CM | POA: Diagnosis not present

## 2014-12-22 DIAGNOSIS — Z992 Dependence on renal dialysis: Secondary | ICD-10-CM | POA: Diagnosis not present

## 2014-12-22 DIAGNOSIS — N186 End stage renal disease: Secondary | ICD-10-CM | POA: Diagnosis not present

## 2014-12-22 DIAGNOSIS — D509 Iron deficiency anemia, unspecified: Secondary | ICD-10-CM | POA: Diagnosis not present

## 2014-12-22 DIAGNOSIS — N2581 Secondary hyperparathyroidism of renal origin: Secondary | ICD-10-CM | POA: Diagnosis not present

## 2014-12-23 DIAGNOSIS — B351 Tinea unguium: Secondary | ICD-10-CM | POA: Diagnosis not present

## 2014-12-23 DIAGNOSIS — L851 Acquired keratosis [keratoderma] palmaris et plantaris: Secondary | ICD-10-CM | POA: Diagnosis not present

## 2014-12-23 DIAGNOSIS — E1142 Type 2 diabetes mellitus with diabetic polyneuropathy: Secondary | ICD-10-CM | POA: Diagnosis not present

## 2014-12-24 DIAGNOSIS — D509 Iron deficiency anemia, unspecified: Secondary | ICD-10-CM | POA: Diagnosis not present

## 2014-12-24 DIAGNOSIS — N2581 Secondary hyperparathyroidism of renal origin: Secondary | ICD-10-CM | POA: Diagnosis not present

## 2014-12-24 DIAGNOSIS — N186 End stage renal disease: Secondary | ICD-10-CM | POA: Diagnosis not present

## 2014-12-24 DIAGNOSIS — D631 Anemia in chronic kidney disease: Secondary | ICD-10-CM | POA: Diagnosis not present

## 2014-12-24 DIAGNOSIS — Z992 Dependence on renal dialysis: Secondary | ICD-10-CM | POA: Diagnosis not present

## 2014-12-26 ENCOUNTER — Encounter (INDEPENDENT_AMBULATORY_CARE_PROVIDER_SITE_OTHER): Payer: Self-pay

## 2014-12-26 DIAGNOSIS — D509 Iron deficiency anemia, unspecified: Secondary | ICD-10-CM | POA: Diagnosis not present

## 2014-12-26 DIAGNOSIS — N2581 Secondary hyperparathyroidism of renal origin: Secondary | ICD-10-CM | POA: Diagnosis not present

## 2014-12-26 DIAGNOSIS — Z992 Dependence on renal dialysis: Secondary | ICD-10-CM | POA: Diagnosis not present

## 2014-12-26 DIAGNOSIS — D631 Anemia in chronic kidney disease: Secondary | ICD-10-CM | POA: Diagnosis not present

## 2014-12-26 DIAGNOSIS — N186 End stage renal disease: Secondary | ICD-10-CM | POA: Diagnosis not present

## 2014-12-29 DIAGNOSIS — Z992 Dependence on renal dialysis: Secondary | ICD-10-CM | POA: Diagnosis not present

## 2014-12-29 DIAGNOSIS — D631 Anemia in chronic kidney disease: Secondary | ICD-10-CM | POA: Diagnosis not present

## 2014-12-29 DIAGNOSIS — N186 End stage renal disease: Secondary | ICD-10-CM | POA: Diagnosis not present

## 2014-12-29 DIAGNOSIS — N2581 Secondary hyperparathyroidism of renal origin: Secondary | ICD-10-CM | POA: Diagnosis not present

## 2014-12-29 DIAGNOSIS — D509 Iron deficiency anemia, unspecified: Secondary | ICD-10-CM | POA: Diagnosis not present

## 2014-12-31 DIAGNOSIS — D631 Anemia in chronic kidney disease: Secondary | ICD-10-CM | POA: Diagnosis not present

## 2014-12-31 DIAGNOSIS — N2581 Secondary hyperparathyroidism of renal origin: Secondary | ICD-10-CM | POA: Diagnosis not present

## 2014-12-31 DIAGNOSIS — D509 Iron deficiency anemia, unspecified: Secondary | ICD-10-CM | POA: Diagnosis not present

## 2014-12-31 DIAGNOSIS — N186 End stage renal disease: Secondary | ICD-10-CM | POA: Diagnosis not present

## 2014-12-31 DIAGNOSIS — Z992 Dependence on renal dialysis: Secondary | ICD-10-CM | POA: Diagnosis not present

## 2015-01-01 ENCOUNTER — Telehealth (INDEPENDENT_AMBULATORY_CARE_PROVIDER_SITE_OTHER): Payer: Self-pay | Admitting: *Deleted

## 2015-01-01 NOTE — Telephone Encounter (Signed)
   Diagnosis:    Result(s)   Card 1: Positive:           Completed by: Zebulan Hinshaw,LPN   HEMOCCULT SENSA DEVELOPER: LOT#:  [id number] EXPIRATION DATE: 9-17   HEMOCCULT SENSA CARD:  LOT#:  02/14 EXPIRATION DATE: 07/18   CARD CONTROL RESULTS:  POSITIVE: Positive NEGATIVE: Negative   ADDITIONAL COMMENTS: Patient was called with the result.

## 2015-01-02 DIAGNOSIS — D631 Anemia in chronic kidney disease: Secondary | ICD-10-CM | POA: Diagnosis not present

## 2015-01-02 DIAGNOSIS — D509 Iron deficiency anemia, unspecified: Secondary | ICD-10-CM | POA: Diagnosis not present

## 2015-01-02 DIAGNOSIS — N2581 Secondary hyperparathyroidism of renal origin: Secondary | ICD-10-CM | POA: Diagnosis not present

## 2015-01-02 DIAGNOSIS — N186 End stage renal disease: Secondary | ICD-10-CM | POA: Diagnosis not present

## 2015-01-02 DIAGNOSIS — Z992 Dependence on renal dialysis: Secondary | ICD-10-CM | POA: Diagnosis not present

## 2015-01-05 DIAGNOSIS — Z992 Dependence on renal dialysis: Secondary | ICD-10-CM | POA: Diagnosis not present

## 2015-01-05 DIAGNOSIS — D509 Iron deficiency anemia, unspecified: Secondary | ICD-10-CM | POA: Diagnosis not present

## 2015-01-05 DIAGNOSIS — N2581 Secondary hyperparathyroidism of renal origin: Secondary | ICD-10-CM | POA: Diagnosis not present

## 2015-01-05 DIAGNOSIS — N186 End stage renal disease: Secondary | ICD-10-CM | POA: Diagnosis not present

## 2015-01-05 DIAGNOSIS — D631 Anemia in chronic kidney disease: Secondary | ICD-10-CM | POA: Diagnosis not present

## 2015-01-06 NOTE — Telephone Encounter (Signed)
Stool heme positive stool is heme positive. Results reviewed with patient's wife. Patient will need colonoscopy. Ann, please schedule colonoscopy on November 3 or 8 if possible.

## 2015-01-07 DIAGNOSIS — D509 Iron deficiency anemia, unspecified: Secondary | ICD-10-CM | POA: Diagnosis not present

## 2015-01-07 DIAGNOSIS — N2581 Secondary hyperparathyroidism of renal origin: Secondary | ICD-10-CM | POA: Diagnosis not present

## 2015-01-07 DIAGNOSIS — N186 End stage renal disease: Secondary | ICD-10-CM | POA: Diagnosis not present

## 2015-01-07 DIAGNOSIS — D631 Anemia in chronic kidney disease: Secondary | ICD-10-CM | POA: Diagnosis not present

## 2015-01-07 DIAGNOSIS — Z992 Dependence on renal dialysis: Secondary | ICD-10-CM | POA: Diagnosis not present

## 2015-01-08 NOTE — Telephone Encounter (Signed)
LM for patient or Gregory Rangel to call me

## 2015-01-08 NOTE — Telephone Encounter (Signed)
LM on Gregory Rangel's cell [phone]) for someone to call me to schedule TCS

## 2015-01-09 ENCOUNTER — Encounter (INDEPENDENT_AMBULATORY_CARE_PROVIDER_SITE_OTHER): Payer: Self-pay | Admitting: *Deleted

## 2015-01-09 ENCOUNTER — Other Ambulatory Visit (INDEPENDENT_AMBULATORY_CARE_PROVIDER_SITE_OTHER): Payer: Self-pay | Admitting: *Deleted

## 2015-01-09 DIAGNOSIS — D631 Anemia in chronic kidney disease: Secondary | ICD-10-CM | POA: Diagnosis not present

## 2015-01-09 DIAGNOSIS — Z992 Dependence on renal dialysis: Secondary | ICD-10-CM | POA: Diagnosis not present

## 2015-01-09 DIAGNOSIS — D509 Iron deficiency anemia, unspecified: Secondary | ICD-10-CM | POA: Diagnosis not present

## 2015-01-09 DIAGNOSIS — N2581 Secondary hyperparathyroidism of renal origin: Secondary | ICD-10-CM | POA: Diagnosis not present

## 2015-01-09 DIAGNOSIS — R195 Other fecal abnormalities: Secondary | ICD-10-CM

## 2015-01-09 DIAGNOSIS — N186 End stage renal disease: Secondary | ICD-10-CM | POA: Diagnosis not present

## 2015-01-09 NOTE — Telephone Encounter (Signed)
TCS sch'd 01/15/15

## 2015-01-12 DIAGNOSIS — Z992 Dependence on renal dialysis: Secondary | ICD-10-CM | POA: Diagnosis not present

## 2015-01-12 DIAGNOSIS — N2581 Secondary hyperparathyroidism of renal origin: Secondary | ICD-10-CM | POA: Diagnosis not present

## 2015-01-12 DIAGNOSIS — N186 End stage renal disease: Secondary | ICD-10-CM | POA: Diagnosis not present

## 2015-01-12 DIAGNOSIS — D631 Anemia in chronic kidney disease: Secondary | ICD-10-CM | POA: Diagnosis not present

## 2015-01-12 DIAGNOSIS — D509 Iron deficiency anemia, unspecified: Secondary | ICD-10-CM | POA: Diagnosis not present

## 2015-01-14 DIAGNOSIS — Z992 Dependence on renal dialysis: Secondary | ICD-10-CM | POA: Diagnosis not present

## 2015-01-14 DIAGNOSIS — D631 Anemia in chronic kidney disease: Secondary | ICD-10-CM | POA: Diagnosis not present

## 2015-01-14 DIAGNOSIS — N186 End stage renal disease: Secondary | ICD-10-CM | POA: Diagnosis not present

## 2015-01-14 DIAGNOSIS — N2581 Secondary hyperparathyroidism of renal origin: Secondary | ICD-10-CM | POA: Diagnosis not present

## 2015-01-14 DIAGNOSIS — D509 Iron deficiency anemia, unspecified: Secondary | ICD-10-CM | POA: Diagnosis not present

## 2015-01-15 ENCOUNTER — Encounter (HOSPITAL_COMMUNITY)
Admission: RE | Disposition: A | Discharge status: Home / Self Care | Payer: Self-pay | Source: Ambulatory Visit | Attending: Internal Medicine

## 2015-01-15 ENCOUNTER — Encounter (HOSPITAL_COMMUNITY): Payer: Self-pay | Admitting: *Deleted

## 2015-01-15 ENCOUNTER — Ambulatory Visit (HOSPITAL_COMMUNITY)
Admission: RE | Admit: 2015-01-15 | Discharge: 2015-01-15 | Disposition: A | Discharge status: Home / Self Care | Payer: Medicare Other | Source: Ambulatory Visit | Attending: Internal Medicine | Admitting: Internal Medicine

## 2015-01-15 DIAGNOSIS — D509 Iron deficiency anemia, unspecified: Secondary | ICD-10-CM | POA: Insufficient documentation

## 2015-01-15 DIAGNOSIS — I120 Hypertensive chronic kidney disease with stage 5 chronic kidney disease or end stage renal disease: Secondary | ICD-10-CM | POA: Insufficient documentation

## 2015-01-15 DIAGNOSIS — K621 Rectal polyp: Secondary | ICD-10-CM | POA: Insufficient documentation

## 2015-01-15 DIAGNOSIS — N186 End stage renal disease: Secondary | ICD-10-CM | POA: Diagnosis not present

## 2015-01-15 DIAGNOSIS — Z992 Dependence on renal dialysis: Secondary | ICD-10-CM | POA: Insufficient documentation

## 2015-01-15 DIAGNOSIS — D125 Benign neoplasm of sigmoid colon: Secondary | ICD-10-CM | POA: Diagnosis not present

## 2015-01-15 DIAGNOSIS — D123 Benign neoplasm of transverse colon: Secondary | ICD-10-CM | POA: Diagnosis not present

## 2015-01-15 DIAGNOSIS — Z85038 Personal history of other malignant neoplasm of large intestine: Secondary | ICD-10-CM | POA: Diagnosis not present

## 2015-01-15 DIAGNOSIS — Z87891 Personal history of nicotine dependence: Secondary | ICD-10-CM | POA: Diagnosis not present

## 2015-01-15 DIAGNOSIS — R195 Other fecal abnormalities: Secondary | ICD-10-CM

## 2015-01-15 DIAGNOSIS — Z9049 Acquired absence of other specified parts of digestive tract: Secondary | ICD-10-CM | POA: Insufficient documentation

## 2015-01-15 DIAGNOSIS — K5730 Diverticulosis of large intestine without perforation or abscess without bleeding: Secondary | ICD-10-CM | POA: Insufficient documentation

## 2015-01-15 DIAGNOSIS — Z7984 Long term (current) use of oral hypoglycemic drugs: Secondary | ICD-10-CM | POA: Diagnosis not present

## 2015-01-15 DIAGNOSIS — D128 Benign neoplasm of rectum: Secondary | ICD-10-CM | POA: Diagnosis not present

## 2015-01-15 DIAGNOSIS — E119 Type 2 diabetes mellitus without complications: Secondary | ICD-10-CM | POA: Insufficient documentation

## 2015-01-15 HISTORY — PX: COLONOSCOPY: SHX5424

## 2015-01-15 HISTORY — DX: Dependence on renal dialysis: Z99.2

## 2015-01-15 HISTORY — DX: End stage renal disease: N18.6

## 2015-01-15 LAB — GLUCOSE, CAPILLARY: GLUCOSE-CAPILLARY: 98 mg/dL (ref 65–99)

## 2015-01-15 SURGERY — COLONOSCOPY
Anesthesia: Moderate Sedation

## 2015-01-15 MED ORDER — MEPERIDINE HCL 50 MG/ML IJ SOLN
INTRAMUSCULAR | Status: DC | PRN
  Administered 2015-01-15 (×2): 25 mg via INTRAVENOUS

## 2015-01-15 MED ORDER — MIDAZOLAM HCL 5 MG/5ML IJ SOLN
INTRAMUSCULAR | Status: AC
  Filled 2015-01-15: qty 10

## 2015-01-15 MED ORDER — STERILE WATER FOR IRRIGATION IR SOLN
Status: DC | PRN
  Administered 2015-01-15: 11:00:00

## 2015-01-15 MED ORDER — SODIUM CHLORIDE 0.9 % IV SOLN
INTRAVENOUS | Status: DC
  Administered 2015-01-15: 10:00:00 via INTRAVENOUS

## 2015-01-15 MED ORDER — MIDAZOLAM HCL 5 MG/5ML IJ SOLN
INTRAMUSCULAR | Status: DC | PRN
  Administered 2015-01-15 (×2): 2 mg via INTRAVENOUS
  Administered 2015-01-15: 1 mg via INTRAVENOUS

## 2015-01-15 MED ORDER — MEPERIDINE HCL 50 MG/ML IJ SOLN
INTRAMUSCULAR | Status: AC
  Filled 2015-01-15: qty 1

## 2015-01-15 NOTE — Op Note (Signed)
COLONOSCOPY PROCEDURE REPORT  PATIENT:  Gregory Rangel  MR#:  [id number] Birthdate:  27-Apr-1933, 81 y.o., male Endoscopist:  Dr. Rogene Houston, MD Referred By:  Dr. Alonza Bogus, MD  Procedure Date: 01/15/2015  Procedure:   Colonoscopy snare polypectomy.  Indications:  Patient is 81 year old Caucasian male was noted to have iron deficiency anemia and heme positive stool. He has history of colon carcinoma. He status post right hemicolectomy in 1998 and his last colonoscopy was in April 2009.  Informed Consent:  The procedure and risks were reviewed with the patient and informed consent was obtained.  Medications:  Demerol 50 mg IV Versed 5 mg IV  Description of procedure:  After a digital rectal exam was performed, that colonoscope was advanced from the anus through the rectum and colon to the area of hepatic flexure where ileocolonic anastomosis was identified. and photographed for the record. From this level scope was slowly and cautiously withdrawn. The mucosal surfaces were carefully surveyed utilizing scope tip to flexion to facilitate fold flattening as needed. The scope was pulled down into the rectum where a thorough exam including retroflexion was performed.  Findings:   Prep satisfactory. Right open ileocolonic anastomosis located in the vicinity of hepatic flexure. 67mm sessile irregular sessile polyp was hot snared from transverse colon just distal to ileocolonic anastomosis. 1mm polyp was hot snared from sigmoid colon. Multiple diverticula at descending and sigmoid colon. Small polyp hot snared from distal rectum. Unremarkable anorectal junction.   Therapeutic/Diagnostic Maneuvers Performed:  See above  Complications:  None  EBL: None  Colon Withdrawal Time:  22 minutes  Impression:  Examination performed to ileocolonic anastomosis which is wide open. 23mm sessile polyp hot snared from proximal transverse colon. 66mm polyp hot snare from sigmoid colon. Small  polyp hot snared from rectum. Sigmoid and rectal polyps were submitted together. Multiple diverticula at descending and sigmoid colon.   Recommendations:  Standard instructions given. Aspirin or NSAIDs for 1 week. Patient will resume usual medications including ferrous sulfate. I will contact patient with biopsy results and further recommendations.  REHMAN,NAJEEB U  01/15/2015 11:23 AM  CC: Dr. Alonza Bogus, MD & Dr. Rayne Du ref. provider found CC: Dr. Peri Maris, MD

## 2015-01-15 NOTE — Discharge Instructions (Signed)
No aspirin or NSAIDs for 1 week. Resume usual medications and high-fiber diet. No driving for 24 hours. Patient will call with biopsy results.  Colonoscopy, Care After These instructions give you information on caring for yourself after your procedure. Your doctor may also give you more specific instructions. Call your doctor if you have any problems or questions after your procedure. HOME CARE  Do not drive for 24 hours.  Do not sign important papers or use machinery for 24 hours.  You may shower.  You may go back to your usual activities, but go slower for the first 24 hours.  Take rest breaks often during the first 24 hours.  Walk around or use warm packs on your belly (abdomen) if you have belly cramping or gas.  Drink enough fluids to keep your pee (urine) clear or pale yellow.  Resume your normal diet. Avoid heavy or fried foods.  Avoid drinking alcohol for 24 hours or as told by your doctor.  Only take medicines as told by your doctor. If a tissue sample (biopsy) was taken during the procedure:   Do not take aspirin or blood thinners for 7 days, or as told by your doctor.  Do not drink alcohol for 7 days, or as told by your doctor.  Eat soft foods for the first 24 hours. GET HELP IF: You still have a small amount of blood in your poop (stool) 2-3 days after the procedure. GET HELP RIGHT AWAY IF:  You have more than a small amount of blood in your poop.  You see clumps of tissue (blood clots) in your poop.  Your belly is puffy (swollen).  You feel sick to your stomach (nauseous) or throw up (vomit).  You have a fever.  You have belly pain that gets worse and medicine does not help. MAKE SURE YOU:  Understand these instructions.  Will watch your condition.  Will get help right away if you are not doing well or get worse.   This information is not intended to replace advice given to you by your health care provider. Make sure you discuss any questions you  have with your health care provider.   Document Released: 04/02/2010 Document Revised: 03/05/2013 Document Reviewed: 11/05/2012 Elsevier Interactive Patient Education [employer]. Diverticulosis Diverticulosis is the condition that develops when small pouches (diverticula) form in the wall of your colon. Your colon, or large intestine, is where water is absorbed and stool is formed. The pouches form when the inside layer of your colon pushes through weak spots in the outer layers of your colon. CAUSES  No one knows exactly what causes diverticulosis. RISK FACTORS  Being older than 41. Your risk for this condition increases with age. Diverticulosis is rare in people younger than 40 years. By age 24, almost everyone has it.  Eating a low-fiber diet.  Being frequently constipated.  Being overweight.  Not getting enough exercise.  Smoking.  Taking over-the-counter pain medicines, like aspirin and ibuprofen. SYMPTOMS  Most people with diverticulosis do not have symptoms. DIAGNOSIS  Because diverticulosis often has no symptoms, health care providers often discover the condition during an exam for other colon problems. In many cases, a health care provider will diagnose diverticulosis while using a flexible scope to examine the colon (colonoscopy). TREATMENT  If you have never developed an infection related to diverticulosis, you may not need treatment. If you have had an infection before, treatment may include:  Eating more fruits, vegetables, and grains.  Taking a  fiber supplement.  Taking a live bacteria supplement (probiotic).  Taking medicine to relax your colon. HOME CARE INSTRUCTIONS   Drink at least 6-8 glasses of water each day to prevent constipation.  Try not to strain when you have a bowel movement.  Keep all follow-up appointments. If you have had an infection before:  Increase the fiber in your diet as directed by your health care provider or  dietitian.  Take a dietary fiber supplement if your health care provider approves.  Only take medicines as directed by your health care provider. SEEK MEDICAL CARE IF:   You have abdominal pain.  You have bloating.  You have cramps.  You have not gone to the bathroom in 3 days. SEEK IMMEDIATE MEDICAL CARE IF:   Your pain gets worse.  Yourbloating becomes very bad.  You have a fever or chills, and your symptoms suddenly get worse.  You begin vomiting.  You have bowel movements that are bloody or black. MAKE SURE YOU:  Understand these instructions.  Will watch your condition.  Will get help right away if you are not doing well or get worse.   This information is not intended to replace advice given to you by your health care provider. Make sure you discuss any questions you have with your health care provider.   Document Released: 11/26/2003 Document Revised: 03/05/2013 Document Reviewed: 01/23/2013 Elsevier Interactive Patient Education 2016 Elsevier Inc. Colon Polyps Polyps are lumps of extra tissue growing inside the body. Polyps can grow in the large intestine (colon). Most colon polyps are noncancerous (benign). However, some colon polyps can become cancerous over time. Polyps that are larger than a pea may be harmful. To be safe, caregivers remove and test all polyps. CAUSES  Polyps form when mutations in the genes cause your cells to grow and divide even though no more tissue is needed. RISK FACTORS There are a number of risk factors that can increase your chances of getting colon polyps. They include:  Being older than 50 years.  Family history of colon polyps or colon cancer.  Long-term colon diseases, such as colitis or Crohn disease.  Being overweight.  Smoking.  Being inactive.  Drinking too much alcohol. SYMPTOMS  Most small polyps do not cause symptoms. If symptoms are present, they may include:  Blood in the stool. The stool may look dark  red or black.  Constipation or diarrhea that lasts longer than 1 week. DIAGNOSIS People often do not know they have polyps until their caregiver finds them during a regular checkup. Your caregiver can use 4 tests to check for polyps:  Digital rectal exam. The caregiver wears gloves and feels inside the rectum. This test would find polyps only in the rectum.  Barium enema. The caregiver puts a liquid called barium into your rectum before taking X-rays of your colon. Barium makes your colon look white. Polyps are dark, so they are easy to see in the X-ray pictures.  Sigmoidoscopy. A thin, flexible tube (sigmoidoscope) is placed into your rectum. The sigmoidoscope has a light and tiny camera in it. The caregiver uses the sigmoidoscope to look at the last third of your colon.  Colonoscopy. This test is like sigmoidoscopy, but the caregiver looks at the entire colon. This is the most common method for finding and removing polyps. TREATMENT  Any polyps will be removed during a sigmoidoscopy or colonoscopy. The polyps are then tested for cancer. PREVENTION  To help lower your risk of getting more colon polyps:  Eat plenty of fruits and vegetables. Avoid eating fatty foods.  Do not smoke.  Avoid drinking alcohol.  Exercise every day.  Lose weight if recommended by your caregiver.  Eat plenty of calcium and folate. Foods that are rich in calcium include milk, cheese, and broccoli. Foods that are rich in folate include chickpeas, kidney beans, and spinach. HOME CARE INSTRUCTIONS Keep all follow-up appointments as directed by your caregiver. You may need periodic exams to check for polyps. SEEK MEDICAL CARE IF: You notice bleeding during a bowel movement.   This information is not intended to replace advice given to you by your health care provider. Make sure you discuss any questions you have with your health care provider.   Document Released: 11/25/2003 Document Revised: 03/21/2014  Document Reviewed: 05/10/2011 Elsevier Interactive Patient Education 2016 Elsevier Inc.   High-Fiber Diet Fiber, also called dietary fiber, is a type of carbohydrate found in fruits, vegetables, whole grains, and beans. A high-fiber diet can have many health benefits. Your health care provider may recommend a high-fiber diet to help:  Prevent constipation. Fiber can make your bowel movements more regular.  Lower your cholesterol.  Relieve hemorrhoids, uncomplicated diverticulosis, or irritable bowel syndrome.  Prevent overeating as part of a weight-loss plan.  Prevent heart disease, type 2 diabetes, and certain cancers. WHAT IS MY PLAN? The recommended daily intake of fiber includes:  38 grams for men under age 80.  83 grams for men over age 93.  30 grams for women under age 61.  2 grams for women over age 34. You can get the recommended daily intake of dietary fiber by eating a variety of fruits, vegetables, grains, and beans. Your health care provider may also recommend a fiber supplement if it is not possible to get enough fiber through your diet. WHAT DO I NEED TO KNOW ABOUT A HIGH-FIBER DIET?  Fiber supplements have not been widely studied for their effectiveness, so it is better to get fiber through food sources.  Always check the fiber content on thenutrition facts label of any prepackaged food. Look for foods that contain at least 5 grams of fiber per serving.  Ask your dietitian if you have questions about specific foods that are related to your condition, especially if those foods are not listed in the following section.  Increase your daily fiber consumption gradually. Increasing your intake of dietary fiber too quickly may cause bloating, cramping, or gas.  Drink plenty of water. Water helps you to digest fiber. WHAT FOODS CAN I EAT? Grains Whole-grain breads. Multigrain cereal. Oats and oatmeal. Brown rice. Barley. Bulgur wheat. [place]. Bran muffins. Popcorn.  Rye wafer crackers. Vegetables Sweet potatoes. Spinach. Kale. Artichokes. Cabbage. Broccoli. Green peas. Carrots. Squash. Fruits Berries. Pears. Apples. Oranges. Avocados. Prunes and raisins. Dried figs. Meats and Other Protein Sources [place], kidney, pinto, and soy beans. Split peas. Lentils. Nuts and seeds. Dairy Fiber-fortified yogurt. Beverages Fiber-fortified soy milk. Fiber-fortified orange juice. Other Fiber bars. The items listed above may not be a complete list of recommended foods or beverages. Contact your dietitian for more options. WHAT FOODS ARE NOT RECOMMENDED? Grains White bread. Pasta made with refined flour. White rice. Vegetables Fried potatoes. Canned vegetables. Well-cooked vegetables.  Fruits Fruit juice. Cooked, strained fruit. Meats and Other Protein Sources Fatty cuts of meat. Fried Sales executive or fried fish. Dairy Milk. Yogurt. Cream cheese. Sour cream. Beverages Soft drinks. Other Cakes and pastries. Butter and oils. The items listed above may not be a complete list of  foods and beverages to avoid. Contact your dietitian for more information. WHAT ARE SOME TIPS FOR INCLUDING HIGH-FIBER FOODS IN MY DIET?  Eat a wide variety of high-fiber foods.  Make sure that half of all grains consumed each day are whole grains.  Replace breads and cereals made from refined flour or white flour with whole-grain breads and cereals.  Replace white rice with brown rice, bulgur wheat, or millet.  Start the day with a breakfast that is high in fiber, such as a cereal that contains at least 5 grams of fiber per serving.  Use beans in place of meat in soups, salads, or pasta.  Eat high-fiber snacks, such as berries, raw vegetables, nuts, or popcorn.   This information is not intended to replace advice given to you by your health care provider. Make sure you discuss any questions you have with your health care provider.   Document Released: 02/28/2005 Document Revised:  03/21/2014 Document Reviewed: 08/13/2013 Elsevier Interactive Patient Education [employer].

## 2015-01-15 NOTE — H&P (Signed)
Gregory Rangel is an 81 y.o. male.   Chief Complaint: Patient is here for colonoscopy. HPI: This 81 year old Caucasian male with multiple medical problems was recently noted to have iron deficiency anemia and heme-positive stool. He denies rectal bleeding or melena. He has history of GERD and heartburns well controlled with therapy. He has history of colon carcinoma. He underwent right hemicolectomy 1998. Last colonoscopy was in April 2009. Family history is negative for CRC.  Past Medical History  Diagnosis Date  . Hypertension   . Shortness of breath     with exertion  . Diabetes mellitus without complication ([place])   . GERD (gastroesophageal reflux disease)   . Arthritis   . DVT (deep venous thrombosis) ([place])   . Varicose veins   . Chronic kidney disease   . Anemia   . Cancer [facility])     colon cancer and prostate  . Kidney stone   . ESRD on dialysis [facility])     Monday, Wednesday and Friday    Past Surgical History  Procedure Laterality Date  . Back surgery      x 2  . Tonsillectomy    . Appendectomy    . Cholecystectomy    . Eye surgery      right eye with lens implant  . Right knee arthroscopy    . Total knee arthroplasty Right 07/06/2012    Procedure: RIGHT TOTAL KNEE ARTHROPLASTY;  Surgeon: Johnn Hai, MD;  [facility]: [facility];  Service: Orthopedics;  Laterality: Right;  . Esophagogastroduodenoscopy (egd) with esophageal dilation N/A 08/09/2012    Procedure: ESOPHAGOGASTRODUODENOSCOPY (EGD) WITH ESOPHAGEAL DILATION;  Surgeon: Rogene Houston, MD;  [facility];  Service: Endoscopy;  Laterality: N/A;  155-moved to 830 Ann to notify pt  . Colon surgery    . Endovenous ablation saphenous vein w/ laser Right 04-25-2013    right greater saphenous vein Curt Jews MD  . Cataract extraction w/phaco Left 11/05/2013    Procedure: CATARACT EXTRACTION PHACO AND INTRAOCULAR LENS PLACEMENT ([place]);  Surgeon: Elta Guadeloupe T. Gershon Crane, MD;  Location: [facility];  Service: Ophthalmology;   Laterality: Left;  CDE: 22.81  . Prostate surgery    . Colonoscopy    . Bascilic vein transposition Left 06/10/2014    Procedure: LEFT FIRST STAGE BASILIC VEIN TRANSPOSITION ;  Surgeon: Angelia Mould, MD;  Location: [place];  Service: Vascular;  Laterality: Left;  . Insertion of dialysis catheter Right 10/17/2014    Procedure: INSERTION OF DIALYSIS CATHETER;  Surgeon: Rosetta Posner, MD;  Location: [place];  Service: Vascular;  Laterality: Right;  . Peripheral vascular catheterization Left 10/23/2014    Procedure: Fistulagram;  Surgeon: Conrad DISH, MD;  Location: [place] CV LAB;  Service: Cardiovascular;  Laterality: Left;  . Ultrasound guidance for vascular access Left 10/23/2014    Procedure: Ultrasound Guidance For Vascular Access;  Surgeon: Conrad Kooskia, MD;  Location: [place] CV LAB;  Service: Cardiovascular;  Laterality: Left;    History reviewed. No pertinent family history. Social History:  reports that he quit smoking about 31 years ago. He has quit using smokeless tobacco. He reports that he does not drink alcohol or use illicit drugs.  Allergies:  Allergies  Allergen Reactions  . Penicillins Hives, Swelling and Rash    Medications Prior to Admission  Medication Sig Dispense Refill  . acetaminophen (TYLENOL) 500 MG tablet Take 1,000 mg by mouth every 6 (six) hours as needed for mild pain, fever or headache.    Marland Kitchen  ALPRAZolam (XANAX) 0.5 MG tablet Take 0.5 mg by mouth at bedtime as needed for sleep.    . calcitRIOL (ROCALTROL) 0.5 MCG capsule Take 0.5 mcg by mouth daily.    . cinacalcet (SENSIPAR) 30 MG tablet Take 30 mg by mouth daily.    Marland Kitchen glipiZIDE (GLUCOTROL XL) 10 MG 24 hr tablet Take 10 mg by mouth every morning.    . multivitamin (RENA-VIT) TABS tablet Take 1 tablet by mouth daily.    . pantoprazole (PROTONIX) 40 MG tablet TAKE ONE TABLET BY MOUTH ONCE DAILY. 30 tablet 5  . rOPINIRole (REQUIP) 0.5 MG tablet Take 0.5 mg by mouth at bedtime as needed (restless leg).      . sevelamer carbonate (RENVELA) 800 MG tablet Take 800 mg by mouth 3 (three) times daily with meals.    . sodium bicarbonate 650 MG tablet Take 650 mg by mouth 2 (two) times daily.      Results for orders placed or performed during the hospital encounter of 01/15/15 (from the past 48 hour(s))  Glucose, capillary     Status: None   Collection Time: 01/15/15  9:51 AM  Result Value Ref Range   Glucose-Capillary 98 65 - 99 mg/dL   No results found.  ROS  Blood pressure 130/67, pulse 78, temperature 98 F (36.7 C), temperature source Oral, resp. rate 13, height 6' (1.829 m), weight 203 lb (92.08 kg), SpO2 98 %. Physical Exam  Constitutional: He appears well-developed and well-nourished.  HENT:  Mouth/Throat: Oropharynx is clear and moist.  Eyes: Conjunctivae are normal. No scleral icterus.  Neck: No thyromegaly present.  Cardiovascular: Normal rate and regular rhythm.   Murmur (grade ejection murmur heard at left upper sternal border and aortic area.) heard. Respiratory: Effort normal and breath sounds normal.  GI: Soft. He exhibits no distension and no mass. There is no tenderness.  Musculoskeletal: He exhibits no edema.  Lymphadenopathy:    He has no cervical adenopathy.  Neurological: He is alert.  Skin: Skin is warm and dry.     Assessment/Plan Iron deficiency anemia and heme positive stool. History of colon carcinoma. Diagnostic/surveillance colonoscopy.  Gregory Rangel,Gregory Rangel 01/15/2015, 10:29 AM

## 2015-01-16 DIAGNOSIS — D631 Anemia in chronic kidney disease: Secondary | ICD-10-CM | POA: Diagnosis not present

## 2015-01-16 DIAGNOSIS — Z992 Dependence on renal dialysis: Secondary | ICD-10-CM | POA: Diagnosis not present

## 2015-01-16 DIAGNOSIS — N2581 Secondary hyperparathyroidism of renal origin: Secondary | ICD-10-CM | POA: Diagnosis not present

## 2015-01-16 DIAGNOSIS — N186 End stage renal disease: Secondary | ICD-10-CM | POA: Diagnosis not present

## 2015-01-16 DIAGNOSIS — D509 Iron deficiency anemia, unspecified: Secondary | ICD-10-CM | POA: Diagnosis not present

## 2015-01-19 DIAGNOSIS — D631 Anemia in chronic kidney disease: Secondary | ICD-10-CM | POA: Diagnosis not present

## 2015-01-19 DIAGNOSIS — N186 End stage renal disease: Secondary | ICD-10-CM | POA: Diagnosis not present

## 2015-01-19 DIAGNOSIS — Z992 Dependence on renal dialysis: Secondary | ICD-10-CM | POA: Diagnosis not present

## 2015-01-19 DIAGNOSIS — D509 Iron deficiency anemia, unspecified: Secondary | ICD-10-CM | POA: Diagnosis not present

## 2015-01-19 DIAGNOSIS — N2581 Secondary hyperparathyroidism of renal origin: Secondary | ICD-10-CM | POA: Diagnosis not present

## 2015-01-21 ENCOUNTER — Encounter (HOSPITAL_COMMUNITY): Payer: Self-pay | Admitting: Internal Medicine

## 2015-01-21 DIAGNOSIS — N2581 Secondary hyperparathyroidism of renal origin: Secondary | ICD-10-CM | POA: Diagnosis not present

## 2015-01-21 DIAGNOSIS — D509 Iron deficiency anemia, unspecified: Secondary | ICD-10-CM | POA: Diagnosis not present

## 2015-01-21 DIAGNOSIS — D631 Anemia in chronic kidney disease: Secondary | ICD-10-CM | POA: Diagnosis not present

## 2015-01-21 DIAGNOSIS — Z992 Dependence on renal dialysis: Secondary | ICD-10-CM | POA: Diagnosis not present

## 2015-01-21 DIAGNOSIS — N186 End stage renal disease: Secondary | ICD-10-CM | POA: Diagnosis not present

## 2015-01-23 DIAGNOSIS — D509 Iron deficiency anemia, unspecified: Secondary | ICD-10-CM | POA: Diagnosis not present

## 2015-01-23 DIAGNOSIS — N186 End stage renal disease: Secondary | ICD-10-CM | POA: Diagnosis not present

## 2015-01-23 DIAGNOSIS — Z992 Dependence on renal dialysis: Secondary | ICD-10-CM | POA: Diagnosis not present

## 2015-01-23 DIAGNOSIS — N2581 Secondary hyperparathyroidism of renal origin: Secondary | ICD-10-CM | POA: Diagnosis not present

## 2015-01-23 DIAGNOSIS — D631 Anemia in chronic kidney disease: Secondary | ICD-10-CM | POA: Diagnosis not present

## 2015-01-26 DIAGNOSIS — D509 Iron deficiency anemia, unspecified: Secondary | ICD-10-CM | POA: Diagnosis not present

## 2015-01-26 DIAGNOSIS — Z992 Dependence on renal dialysis: Secondary | ICD-10-CM | POA: Diagnosis not present

## 2015-01-26 DIAGNOSIS — N186 End stage renal disease: Secondary | ICD-10-CM | POA: Diagnosis not present

## 2015-01-26 DIAGNOSIS — D631 Anemia in chronic kidney disease: Secondary | ICD-10-CM | POA: Diagnosis not present

## 2015-01-26 DIAGNOSIS — N2581 Secondary hyperparathyroidism of renal origin: Secondary | ICD-10-CM | POA: Diagnosis not present

## 2015-01-28 DIAGNOSIS — Z992 Dependence on renal dialysis: Secondary | ICD-10-CM | POA: Diagnosis not present

## 2015-01-28 DIAGNOSIS — D509 Iron deficiency anemia, unspecified: Secondary | ICD-10-CM | POA: Diagnosis not present

## 2015-01-28 DIAGNOSIS — N2581 Secondary hyperparathyroidism of renal origin: Secondary | ICD-10-CM | POA: Diagnosis not present

## 2015-01-28 DIAGNOSIS — N186 End stage renal disease: Secondary | ICD-10-CM | POA: Diagnosis not present

## 2015-01-28 DIAGNOSIS — D631 Anemia in chronic kidney disease: Secondary | ICD-10-CM | POA: Diagnosis not present

## 2015-01-30 DIAGNOSIS — D509 Iron deficiency anemia, unspecified: Secondary | ICD-10-CM | POA: Diagnosis not present

## 2015-01-30 DIAGNOSIS — Z992 Dependence on renal dialysis: Secondary | ICD-10-CM | POA: Diagnosis not present

## 2015-01-30 DIAGNOSIS — N2581 Secondary hyperparathyroidism of renal origin: Secondary | ICD-10-CM | POA: Diagnosis not present

## 2015-01-30 DIAGNOSIS — N186 End stage renal disease: Secondary | ICD-10-CM | POA: Diagnosis not present

## 2015-01-30 DIAGNOSIS — D631 Anemia in chronic kidney disease: Secondary | ICD-10-CM | POA: Diagnosis not present

## 2015-02-02 DIAGNOSIS — D509 Iron deficiency anemia, unspecified: Secondary | ICD-10-CM | POA: Diagnosis not present

## 2015-02-02 DIAGNOSIS — N2581 Secondary hyperparathyroidism of renal origin: Secondary | ICD-10-CM | POA: Diagnosis not present

## 2015-02-02 DIAGNOSIS — N186 End stage renal disease: Secondary | ICD-10-CM | POA: Diagnosis not present

## 2015-02-02 DIAGNOSIS — D631 Anemia in chronic kidney disease: Secondary | ICD-10-CM | POA: Diagnosis not present

## 2015-02-02 DIAGNOSIS — Z992 Dependence on renal dialysis: Secondary | ICD-10-CM | POA: Diagnosis not present

## 2015-02-04 DIAGNOSIS — N2581 Secondary hyperparathyroidism of renal origin: Secondary | ICD-10-CM | POA: Diagnosis not present

## 2015-02-04 DIAGNOSIS — D509 Iron deficiency anemia, unspecified: Secondary | ICD-10-CM | POA: Diagnosis not present

## 2015-02-04 DIAGNOSIS — N186 End stage renal disease: Secondary | ICD-10-CM | POA: Diagnosis not present

## 2015-02-04 DIAGNOSIS — D631 Anemia in chronic kidney disease: Secondary | ICD-10-CM | POA: Diagnosis not present

## 2015-02-04 DIAGNOSIS — Z992 Dependence on renal dialysis: Secondary | ICD-10-CM | POA: Diagnosis not present

## 2015-02-06 DIAGNOSIS — Z992 Dependence on renal dialysis: Secondary | ICD-10-CM | POA: Diagnosis not present

## 2015-02-06 DIAGNOSIS — N2581 Secondary hyperparathyroidism of renal origin: Secondary | ICD-10-CM | POA: Diagnosis not present

## 2015-02-06 DIAGNOSIS — D509 Iron deficiency anemia, unspecified: Secondary | ICD-10-CM | POA: Diagnosis not present

## 2015-02-06 DIAGNOSIS — N186 End stage renal disease: Secondary | ICD-10-CM | POA: Diagnosis not present

## 2015-02-06 DIAGNOSIS — D631 Anemia in chronic kidney disease: Secondary | ICD-10-CM | POA: Diagnosis not present

## 2015-02-09 DIAGNOSIS — Z992 Dependence on renal dialysis: Secondary | ICD-10-CM | POA: Diagnosis not present

## 2015-02-09 DIAGNOSIS — D509 Iron deficiency anemia, unspecified: Secondary | ICD-10-CM | POA: Diagnosis not present

## 2015-02-09 DIAGNOSIS — N2581 Secondary hyperparathyroidism of renal origin: Secondary | ICD-10-CM | POA: Diagnosis not present

## 2015-02-09 DIAGNOSIS — N186 End stage renal disease: Secondary | ICD-10-CM | POA: Diagnosis not present

## 2015-02-09 DIAGNOSIS — D631 Anemia in chronic kidney disease: Secondary | ICD-10-CM | POA: Diagnosis not present

## 2015-02-10 DIAGNOSIS — Z85828 Personal history of other malignant neoplasm of skin: Secondary | ICD-10-CM | POA: Diagnosis not present

## 2015-02-10 DIAGNOSIS — L570 Actinic keratosis: Secondary | ICD-10-CM | POA: Diagnosis not present

## 2015-02-11 DIAGNOSIS — N186 End stage renal disease: Secondary | ICD-10-CM | POA: Diagnosis not present

## 2015-02-11 DIAGNOSIS — D509 Iron deficiency anemia, unspecified: Secondary | ICD-10-CM | POA: Diagnosis not present

## 2015-02-11 DIAGNOSIS — D631 Anemia in chronic kidney disease: Secondary | ICD-10-CM | POA: Diagnosis not present

## 2015-02-11 DIAGNOSIS — N2581 Secondary hyperparathyroidism of renal origin: Secondary | ICD-10-CM | POA: Diagnosis not present

## 2015-02-11 DIAGNOSIS — Z992 Dependence on renal dialysis: Secondary | ICD-10-CM | POA: Diagnosis not present

## 2015-02-13 DIAGNOSIS — N186 End stage renal disease: Secondary | ICD-10-CM | POA: Diagnosis not present

## 2015-02-13 DIAGNOSIS — D631 Anemia in chronic kidney disease: Secondary | ICD-10-CM | POA: Diagnosis not present

## 2015-02-13 DIAGNOSIS — Z992 Dependence on renal dialysis: Secondary | ICD-10-CM | POA: Diagnosis not present

## 2015-02-13 DIAGNOSIS — D509 Iron deficiency anemia, unspecified: Secondary | ICD-10-CM | POA: Diagnosis not present

## 2015-02-13 DIAGNOSIS — N2581 Secondary hyperparathyroidism of renal origin: Secondary | ICD-10-CM | POA: Diagnosis not present

## 2015-03-03 DIAGNOSIS — E1142 Type 2 diabetes mellitus with diabetic polyneuropathy: Secondary | ICD-10-CM | POA: Diagnosis not present

## 2015-03-03 DIAGNOSIS — B351 Tinea unguium: Secondary | ICD-10-CM | POA: Diagnosis not present

## 2015-03-03 DIAGNOSIS — L851 Acquired keratosis [keratoderma] palmaris et plantaris: Secondary | ICD-10-CM | POA: Diagnosis not present

## 2015-03-05 DIAGNOSIS — M179 Osteoarthritis of knee, unspecified: Secondary | ICD-10-CM | POA: Diagnosis not present

## 2015-03-05 DIAGNOSIS — E1121 Type 2 diabetes mellitus with diabetic nephropathy: Secondary | ICD-10-CM | POA: Diagnosis not present

## 2015-03-05 DIAGNOSIS — N185 Chronic kidney disease, stage 5: Secondary | ICD-10-CM | POA: Diagnosis not present

## 2015-03-05 DIAGNOSIS — I10 Essential (primary) hypertension: Secondary | ICD-10-CM | POA: Diagnosis not present

## 2015-03-14 DIAGNOSIS — Z992 Dependence on renal dialysis: Secondary | ICD-10-CM | POA: Diagnosis not present

## 2015-03-14 DIAGNOSIS — N186 End stage renal disease: Secondary | ICD-10-CM | POA: Diagnosis not present

## 2015-03-16 DIAGNOSIS — D509 Iron deficiency anemia, unspecified: Secondary | ICD-10-CM | POA: Diagnosis not present

## 2015-03-16 DIAGNOSIS — N186 End stage renal disease: Secondary | ICD-10-CM | POA: Diagnosis not present

## 2015-03-16 DIAGNOSIS — N2581 Secondary hyperparathyroidism of renal origin: Secondary | ICD-10-CM | POA: Diagnosis not present

## 2015-03-16 DIAGNOSIS — Z992 Dependence on renal dialysis: Secondary | ICD-10-CM | POA: Diagnosis not present

## 2015-03-18 DIAGNOSIS — D509 Iron deficiency anemia, unspecified: Secondary | ICD-10-CM | POA: Diagnosis not present

## 2015-03-18 DIAGNOSIS — N2581 Secondary hyperparathyroidism of renal origin: Secondary | ICD-10-CM | POA: Diagnosis not present

## 2015-03-18 DIAGNOSIS — N186 End stage renal disease: Secondary | ICD-10-CM | POA: Diagnosis not present

## 2015-03-18 DIAGNOSIS — Z992 Dependence on renal dialysis: Secondary | ICD-10-CM | POA: Diagnosis not present

## 2015-03-20 DIAGNOSIS — N186 End stage renal disease: Secondary | ICD-10-CM | POA: Diagnosis not present

## 2015-03-20 DIAGNOSIS — N2581 Secondary hyperparathyroidism of renal origin: Secondary | ICD-10-CM | POA: Diagnosis not present

## 2015-03-20 DIAGNOSIS — D509 Iron deficiency anemia, unspecified: Secondary | ICD-10-CM | POA: Diagnosis not present

## 2015-03-20 DIAGNOSIS — Z992 Dependence on renal dialysis: Secondary | ICD-10-CM | POA: Diagnosis not present

## 2015-03-23 DIAGNOSIS — D509 Iron deficiency anemia, unspecified: Secondary | ICD-10-CM | POA: Diagnosis not present

## 2015-03-23 DIAGNOSIS — Z992 Dependence on renal dialysis: Secondary | ICD-10-CM | POA: Diagnosis not present

## 2015-03-23 DIAGNOSIS — N2581 Secondary hyperparathyroidism of renal origin: Secondary | ICD-10-CM | POA: Diagnosis not present

## 2015-03-23 DIAGNOSIS — N186 End stage renal disease: Secondary | ICD-10-CM | POA: Diagnosis not present

## 2015-03-25 DIAGNOSIS — Z992 Dependence on renal dialysis: Secondary | ICD-10-CM | POA: Diagnosis not present

## 2015-03-25 DIAGNOSIS — D509 Iron deficiency anemia, unspecified: Secondary | ICD-10-CM | POA: Diagnosis not present

## 2015-03-25 DIAGNOSIS — N2581 Secondary hyperparathyroidism of renal origin: Secondary | ICD-10-CM | POA: Diagnosis not present

## 2015-03-25 DIAGNOSIS — N186 End stage renal disease: Secondary | ICD-10-CM | POA: Diagnosis not present

## 2015-03-27 DIAGNOSIS — N2581 Secondary hyperparathyroidism of renal origin: Secondary | ICD-10-CM | POA: Diagnosis not present

## 2015-03-27 DIAGNOSIS — N186 End stage renal disease: Secondary | ICD-10-CM | POA: Diagnosis not present

## 2015-03-27 DIAGNOSIS — D509 Iron deficiency anemia, unspecified: Secondary | ICD-10-CM | POA: Diagnosis not present

## 2015-03-27 DIAGNOSIS — Z992 Dependence on renal dialysis: Secondary | ICD-10-CM | POA: Diagnosis not present

## 2015-03-27 IMAGING — CR DG KNEE 1-2V*R*
2 series · 2 of 2 positions shown · non-contrast
Comparison: Right knee radiographs 12/18/2009

CLINICAL DATA: Right knee degenerative joint disease.  Preop for
total knee arthroplasty

RIGHT KNEE - 1-2 VIEW

[t knee ap right]
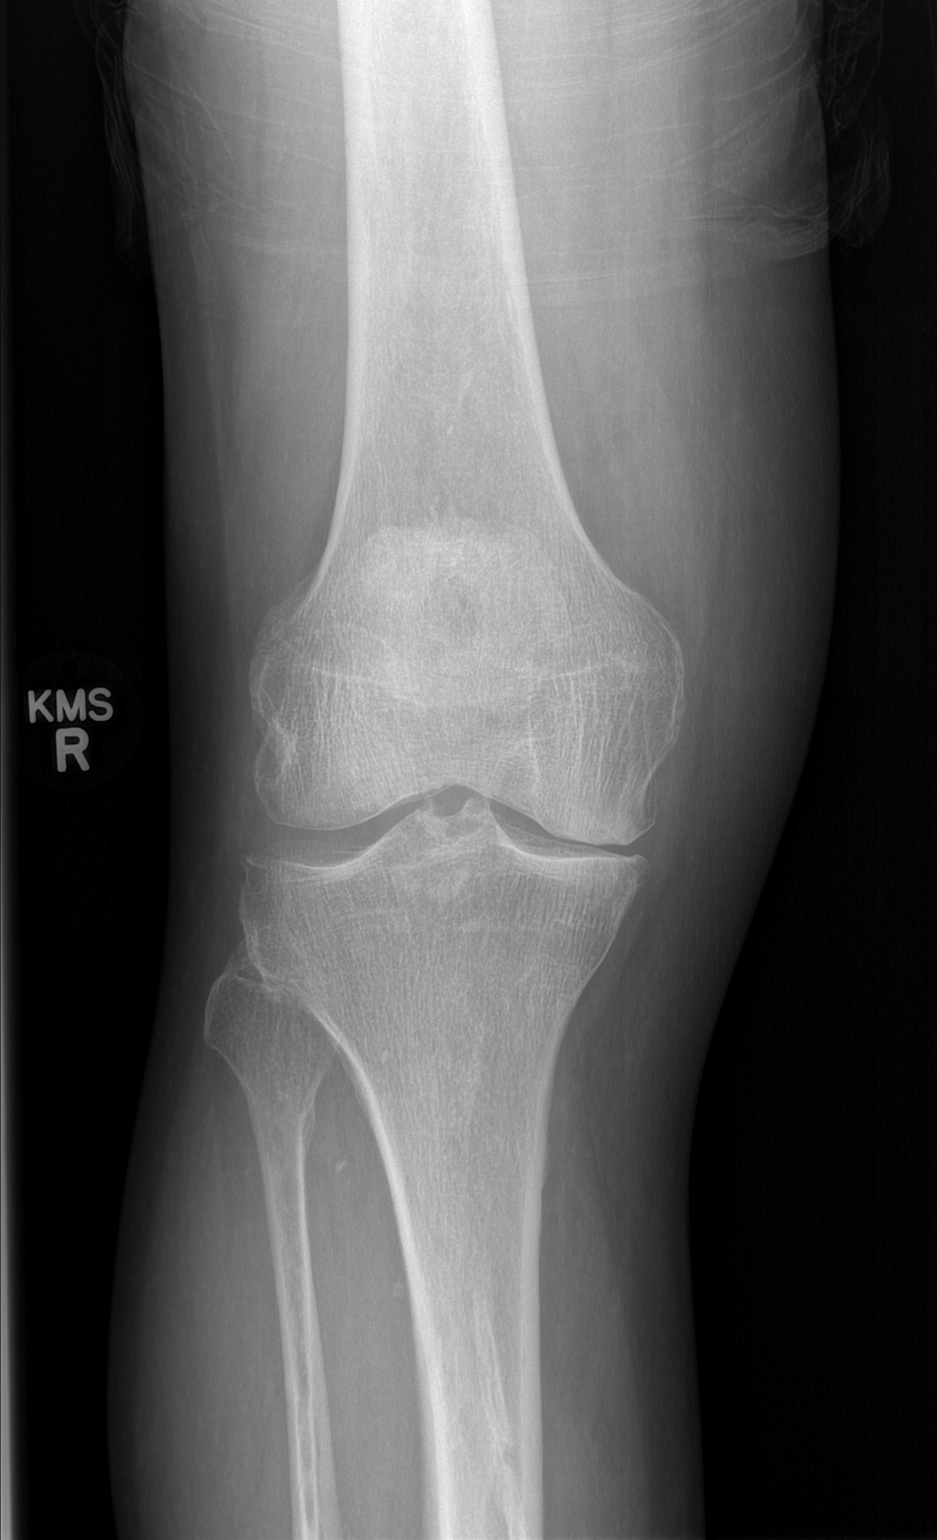

[t knee lat right]
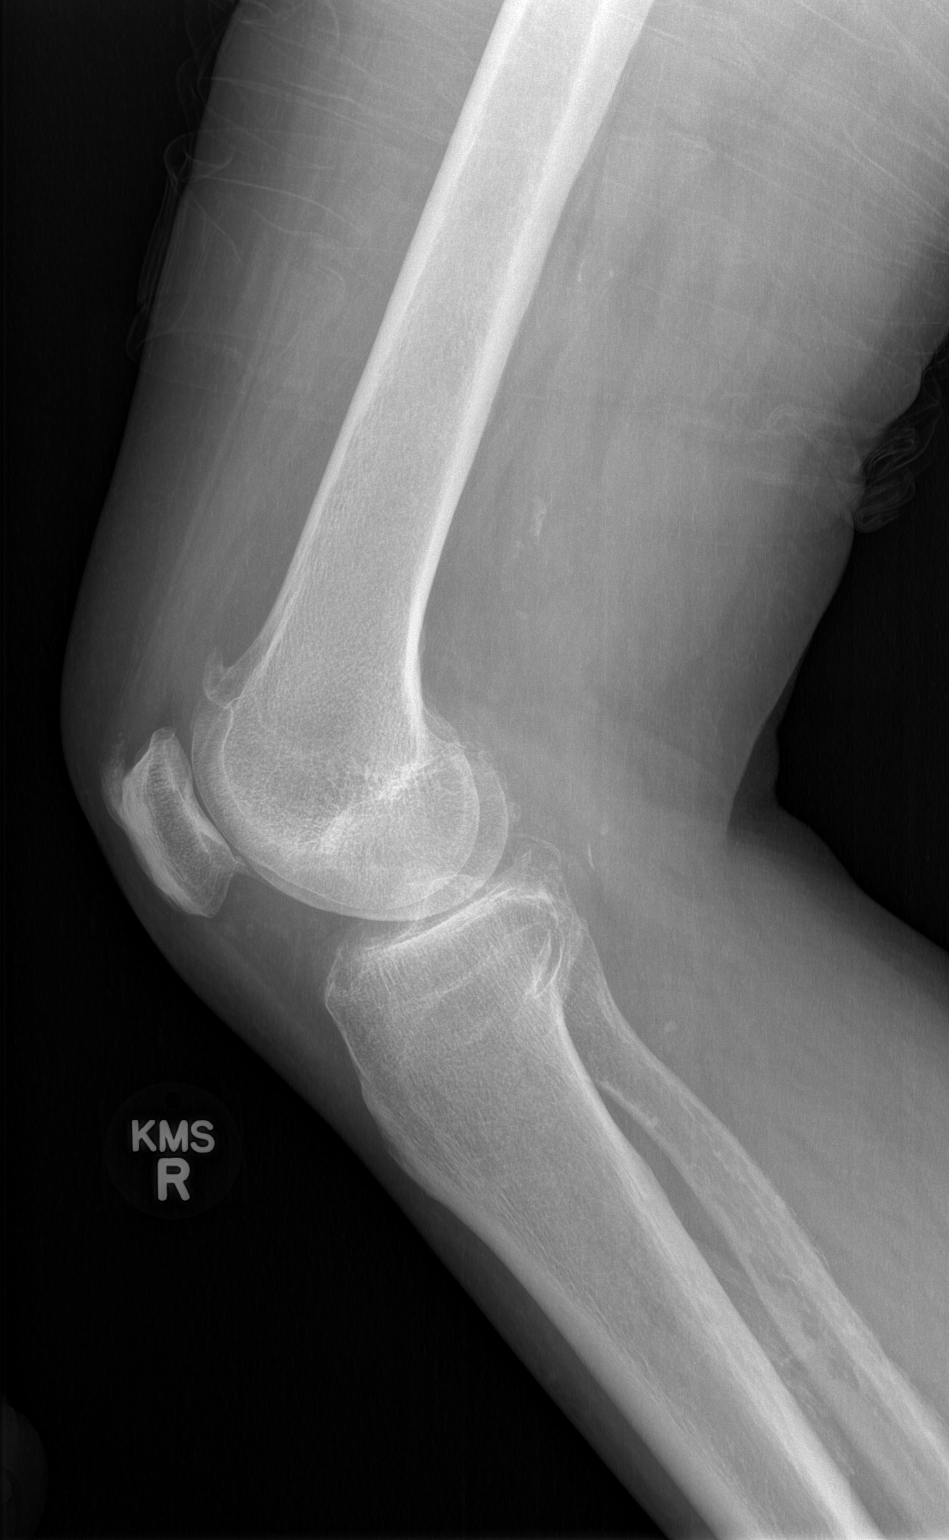

[2 of 2 positions shown; findings below may reference images not displayed]

FINDINGS: There is diffuse bony demineralization.  There is joint
space narrowing of the medial compartment with small marginal
spurring.  Spur formation is seen in the anterior distal femur and
both poles of the patella.  Lateral compartment appears maintained.
No acute bony abnormality is identified.

Scattered vascular calcifications are noted.  No definite joint
effusion or focal soft tissue swelling appreciated.
IMPRESSION: Mild to moderate osteoarthritis of the medial and patellofemoral
compartments.

## 2015-03-30 DIAGNOSIS — Z992 Dependence on renal dialysis: Secondary | ICD-10-CM | POA: Diagnosis not present

## 2015-03-30 DIAGNOSIS — N186 End stage renal disease: Secondary | ICD-10-CM | POA: Diagnosis not present

## 2015-03-30 DIAGNOSIS — N2581 Secondary hyperparathyroidism of renal origin: Secondary | ICD-10-CM | POA: Diagnosis not present

## 2015-03-30 DIAGNOSIS — D509 Iron deficiency anemia, unspecified: Secondary | ICD-10-CM | POA: Diagnosis not present

## 2015-04-01 DIAGNOSIS — N186 End stage renal disease: Secondary | ICD-10-CM | POA: Diagnosis not present

## 2015-04-01 DIAGNOSIS — D509 Iron deficiency anemia, unspecified: Secondary | ICD-10-CM | POA: Diagnosis not present

## 2015-04-01 DIAGNOSIS — N2581 Secondary hyperparathyroidism of renal origin: Secondary | ICD-10-CM | POA: Diagnosis not present

## 2015-04-01 DIAGNOSIS — Z992 Dependence on renal dialysis: Secondary | ICD-10-CM | POA: Diagnosis not present

## 2015-04-03 DIAGNOSIS — N186 End stage renal disease: Secondary | ICD-10-CM | POA: Diagnosis not present

## 2015-04-03 DIAGNOSIS — D509 Iron deficiency anemia, unspecified: Secondary | ICD-10-CM | POA: Diagnosis not present

## 2015-04-03 DIAGNOSIS — Z992 Dependence on renal dialysis: Secondary | ICD-10-CM | POA: Diagnosis not present

## 2015-04-03 DIAGNOSIS — N2581 Secondary hyperparathyroidism of renal origin: Secondary | ICD-10-CM | POA: Diagnosis not present

## 2015-04-06 DIAGNOSIS — D509 Iron deficiency anemia, unspecified: Secondary | ICD-10-CM | POA: Diagnosis not present

## 2015-04-06 DIAGNOSIS — Z992 Dependence on renal dialysis: Secondary | ICD-10-CM | POA: Diagnosis not present

## 2015-04-06 DIAGNOSIS — N2581 Secondary hyperparathyroidism of renal origin: Secondary | ICD-10-CM | POA: Diagnosis not present

## 2015-04-06 DIAGNOSIS — N186 End stage renal disease: Secondary | ICD-10-CM | POA: Diagnosis not present

## 2015-04-08 DIAGNOSIS — D509 Iron deficiency anemia, unspecified: Secondary | ICD-10-CM | POA: Diagnosis not present

## 2015-04-08 DIAGNOSIS — N186 End stage renal disease: Secondary | ICD-10-CM | POA: Diagnosis not present

## 2015-04-08 DIAGNOSIS — N2581 Secondary hyperparathyroidism of renal origin: Secondary | ICD-10-CM | POA: Diagnosis not present

## 2015-04-08 DIAGNOSIS — Z992 Dependence on renal dialysis: Secondary | ICD-10-CM | POA: Diagnosis not present

## 2015-04-10 DIAGNOSIS — N2581 Secondary hyperparathyroidism of renal origin: Secondary | ICD-10-CM | POA: Diagnosis not present

## 2015-04-10 DIAGNOSIS — N186 End stage renal disease: Secondary | ICD-10-CM | POA: Diagnosis not present

## 2015-04-10 DIAGNOSIS — Z992 Dependence on renal dialysis: Secondary | ICD-10-CM | POA: Diagnosis not present

## 2015-04-10 DIAGNOSIS — D509 Iron deficiency anemia, unspecified: Secondary | ICD-10-CM | POA: Diagnosis not present

## 2015-04-13 DIAGNOSIS — N2581 Secondary hyperparathyroidism of renal origin: Secondary | ICD-10-CM | POA: Diagnosis not present

## 2015-04-13 DIAGNOSIS — D509 Iron deficiency anemia, unspecified: Secondary | ICD-10-CM | POA: Diagnosis not present

## 2015-04-13 DIAGNOSIS — Z992 Dependence on renal dialysis: Secondary | ICD-10-CM | POA: Diagnosis not present

## 2015-04-13 DIAGNOSIS — N186 End stage renal disease: Secondary | ICD-10-CM | POA: Diagnosis not present

## 2015-04-14 DIAGNOSIS — N186 End stage renal disease: Secondary | ICD-10-CM | POA: Diagnosis not present

## 2015-04-14 DIAGNOSIS — Z992 Dependence on renal dialysis: Secondary | ICD-10-CM | POA: Diagnosis not present

## 2015-04-15 DIAGNOSIS — D509 Iron deficiency anemia, unspecified: Secondary | ICD-10-CM | POA: Diagnosis not present

## 2015-04-15 DIAGNOSIS — Z992 Dependence on renal dialysis: Secondary | ICD-10-CM | POA: Diagnosis not present

## 2015-04-15 DIAGNOSIS — N186 End stage renal disease: Secondary | ICD-10-CM | POA: Diagnosis not present

## 2015-04-15 DIAGNOSIS — N2581 Secondary hyperparathyroidism of renal origin: Secondary | ICD-10-CM | POA: Diagnosis not present

## 2015-04-17 DIAGNOSIS — N2581 Secondary hyperparathyroidism of renal origin: Secondary | ICD-10-CM | POA: Diagnosis not present

## 2015-04-17 DIAGNOSIS — N186 End stage renal disease: Secondary | ICD-10-CM | POA: Diagnosis not present

## 2015-04-17 DIAGNOSIS — D509 Iron deficiency anemia, unspecified: Secondary | ICD-10-CM | POA: Diagnosis not present

## 2015-04-17 DIAGNOSIS — Z992 Dependence on renal dialysis: Secondary | ICD-10-CM | POA: Diagnosis not present

## 2015-04-20 DIAGNOSIS — D509 Iron deficiency anemia, unspecified: Secondary | ICD-10-CM | POA: Diagnosis not present

## 2015-04-20 DIAGNOSIS — N186 End stage renal disease: Secondary | ICD-10-CM | POA: Diagnosis not present

## 2015-04-20 DIAGNOSIS — N2581 Secondary hyperparathyroidism of renal origin: Secondary | ICD-10-CM | POA: Diagnosis not present

## 2015-04-20 DIAGNOSIS — Z992 Dependence on renal dialysis: Secondary | ICD-10-CM | POA: Diagnosis not present

## 2015-04-22 DIAGNOSIS — N186 End stage renal disease: Secondary | ICD-10-CM | POA: Diagnosis not present

## 2015-04-22 DIAGNOSIS — N2581 Secondary hyperparathyroidism of renal origin: Secondary | ICD-10-CM | POA: Diagnosis not present

## 2015-04-22 DIAGNOSIS — D509 Iron deficiency anemia, unspecified: Secondary | ICD-10-CM | POA: Diagnosis not present

## 2015-04-22 DIAGNOSIS — Z992 Dependence on renal dialysis: Secondary | ICD-10-CM | POA: Diagnosis not present

## 2015-04-24 DIAGNOSIS — D509 Iron deficiency anemia, unspecified: Secondary | ICD-10-CM | POA: Diagnosis not present

## 2015-04-24 DIAGNOSIS — N2581 Secondary hyperparathyroidism of renal origin: Secondary | ICD-10-CM | POA: Diagnosis not present

## 2015-04-24 DIAGNOSIS — Z992 Dependence on renal dialysis: Secondary | ICD-10-CM | POA: Diagnosis not present

## 2015-04-24 DIAGNOSIS — N186 End stage renal disease: Secondary | ICD-10-CM | POA: Diagnosis not present

## 2015-04-27 DIAGNOSIS — Z992 Dependence on renal dialysis: Secondary | ICD-10-CM | POA: Diagnosis not present

## 2015-04-27 DIAGNOSIS — D509 Iron deficiency anemia, unspecified: Secondary | ICD-10-CM | POA: Diagnosis not present

## 2015-04-27 DIAGNOSIS — N2581 Secondary hyperparathyroidism of renal origin: Secondary | ICD-10-CM | POA: Diagnosis not present

## 2015-04-27 DIAGNOSIS — N186 End stage renal disease: Secondary | ICD-10-CM | POA: Diagnosis not present

## 2015-04-29 DIAGNOSIS — N2581 Secondary hyperparathyroidism of renal origin: Secondary | ICD-10-CM | POA: Diagnosis not present

## 2015-04-29 DIAGNOSIS — N186 End stage renal disease: Secondary | ICD-10-CM | POA: Diagnosis not present

## 2015-04-29 DIAGNOSIS — D509 Iron deficiency anemia, unspecified: Secondary | ICD-10-CM | POA: Diagnosis not present

## 2015-04-29 DIAGNOSIS — Z992 Dependence on renal dialysis: Secondary | ICD-10-CM | POA: Diagnosis not present

## 2015-05-01 DIAGNOSIS — N186 End stage renal disease: Secondary | ICD-10-CM | POA: Diagnosis not present

## 2015-05-01 DIAGNOSIS — D509 Iron deficiency anemia, unspecified: Secondary | ICD-10-CM | POA: Diagnosis not present

## 2015-05-01 DIAGNOSIS — N2581 Secondary hyperparathyroidism of renal origin: Secondary | ICD-10-CM | POA: Diagnosis not present

## 2015-05-01 DIAGNOSIS — Z992 Dependence on renal dialysis: Secondary | ICD-10-CM | POA: Diagnosis not present

## 2015-05-04 DIAGNOSIS — Z992 Dependence on renal dialysis: Secondary | ICD-10-CM | POA: Diagnosis not present

## 2015-05-04 DIAGNOSIS — N186 End stage renal disease: Secondary | ICD-10-CM | POA: Diagnosis not present

## 2015-05-04 DIAGNOSIS — N2581 Secondary hyperparathyroidism of renal origin: Secondary | ICD-10-CM | POA: Diagnosis not present

## 2015-05-04 DIAGNOSIS — D509 Iron deficiency anemia, unspecified: Secondary | ICD-10-CM | POA: Diagnosis not present

## 2015-05-05 DIAGNOSIS — Z961 Presence of intraocular lens: Secondary | ICD-10-CM | POA: Diagnosis not present

## 2015-05-05 DIAGNOSIS — H40013 Open angle with borderline findings, low risk, bilateral: Secondary | ICD-10-CM | POA: Diagnosis not present

## 2015-05-05 DIAGNOSIS — E119 Type 2 diabetes mellitus without complications: Secondary | ICD-10-CM | POA: Diagnosis not present

## 2015-05-06 DIAGNOSIS — Z992 Dependence on renal dialysis: Secondary | ICD-10-CM | POA: Diagnosis not present

## 2015-05-06 DIAGNOSIS — N186 End stage renal disease: Secondary | ICD-10-CM | POA: Diagnosis not present

## 2015-05-06 DIAGNOSIS — N2581 Secondary hyperparathyroidism of renal origin: Secondary | ICD-10-CM | POA: Diagnosis not present

## 2015-05-06 DIAGNOSIS — D509 Iron deficiency anemia, unspecified: Secondary | ICD-10-CM | POA: Diagnosis not present

## 2015-05-08 DIAGNOSIS — N2581 Secondary hyperparathyroidism of renal origin: Secondary | ICD-10-CM | POA: Diagnosis not present

## 2015-05-08 DIAGNOSIS — N186 End stage renal disease: Secondary | ICD-10-CM | POA: Diagnosis not present

## 2015-05-08 DIAGNOSIS — Z992 Dependence on renal dialysis: Secondary | ICD-10-CM | POA: Diagnosis not present

## 2015-05-08 DIAGNOSIS — D509 Iron deficiency anemia, unspecified: Secondary | ICD-10-CM | POA: Diagnosis not present

## 2015-05-11 DIAGNOSIS — D509 Iron deficiency anemia, unspecified: Secondary | ICD-10-CM | POA: Diagnosis not present

## 2015-05-11 DIAGNOSIS — N2581 Secondary hyperparathyroidism of renal origin: Secondary | ICD-10-CM | POA: Diagnosis not present

## 2015-05-11 DIAGNOSIS — N186 End stage renal disease: Secondary | ICD-10-CM | POA: Diagnosis not present

## 2015-05-11 DIAGNOSIS — Z992 Dependence on renal dialysis: Secondary | ICD-10-CM | POA: Diagnosis not present

## 2015-05-12 DIAGNOSIS — Z992 Dependence on renal dialysis: Secondary | ICD-10-CM | POA: Diagnosis not present

## 2015-05-12 DIAGNOSIS — L851 Acquired keratosis [keratoderma] palmaris et plantaris: Secondary | ICD-10-CM | POA: Diagnosis not present

## 2015-05-12 DIAGNOSIS — E1142 Type 2 diabetes mellitus with diabetic polyneuropathy: Secondary | ICD-10-CM | POA: Diagnosis not present

## 2015-05-12 DIAGNOSIS — B351 Tinea unguium: Secondary | ICD-10-CM | POA: Diagnosis not present

## 2015-05-12 DIAGNOSIS — N186 End stage renal disease: Secondary | ICD-10-CM | POA: Diagnosis not present

## 2015-05-13 DIAGNOSIS — D509 Iron deficiency anemia, unspecified: Secondary | ICD-10-CM | POA: Diagnosis not present

## 2015-05-13 DIAGNOSIS — Z992 Dependence on renal dialysis: Secondary | ICD-10-CM | POA: Diagnosis not present

## 2015-05-13 DIAGNOSIS — N186 End stage renal disease: Secondary | ICD-10-CM | POA: Diagnosis not present

## 2015-05-15 DIAGNOSIS — Z992 Dependence on renal dialysis: Secondary | ICD-10-CM | POA: Diagnosis not present

## 2015-05-15 DIAGNOSIS — D509 Iron deficiency anemia, unspecified: Secondary | ICD-10-CM | POA: Diagnosis not present

## 2015-05-15 DIAGNOSIS — N186 End stage renal disease: Secondary | ICD-10-CM | POA: Diagnosis not present

## 2015-05-18 DIAGNOSIS — D509 Iron deficiency anemia, unspecified: Secondary | ICD-10-CM | POA: Diagnosis not present

## 2015-05-18 DIAGNOSIS — Z992 Dependence on renal dialysis: Secondary | ICD-10-CM | POA: Diagnosis not present

## 2015-05-18 DIAGNOSIS — N186 End stage renal disease: Secondary | ICD-10-CM | POA: Diagnosis not present

## 2015-05-20 DIAGNOSIS — N186 End stage renal disease: Secondary | ICD-10-CM | POA: Diagnosis not present

## 2015-05-20 DIAGNOSIS — D509 Iron deficiency anemia, unspecified: Secondary | ICD-10-CM | POA: Diagnosis not present

## 2015-05-20 DIAGNOSIS — Z992 Dependence on renal dialysis: Secondary | ICD-10-CM | POA: Diagnosis not present

## 2015-05-22 DIAGNOSIS — D509 Iron deficiency anemia, unspecified: Secondary | ICD-10-CM | POA: Diagnosis not present

## 2015-05-22 DIAGNOSIS — Z992 Dependence on renal dialysis: Secondary | ICD-10-CM | POA: Diagnosis not present

## 2015-05-22 DIAGNOSIS — N186 End stage renal disease: Secondary | ICD-10-CM | POA: Diagnosis not present

## 2015-05-25 DIAGNOSIS — N186 End stage renal disease: Secondary | ICD-10-CM | POA: Diagnosis not present

## 2015-05-25 DIAGNOSIS — Z992 Dependence on renal dialysis: Secondary | ICD-10-CM | POA: Diagnosis not present

## 2015-05-25 DIAGNOSIS — D509 Iron deficiency anemia, unspecified: Secondary | ICD-10-CM | POA: Diagnosis not present

## 2015-05-27 DIAGNOSIS — N186 End stage renal disease: Secondary | ICD-10-CM | POA: Diagnosis not present

## 2015-05-27 DIAGNOSIS — Z992 Dependence on renal dialysis: Secondary | ICD-10-CM | POA: Diagnosis not present

## 2015-05-27 DIAGNOSIS — D509 Iron deficiency anemia, unspecified: Secondary | ICD-10-CM | POA: Diagnosis not present

## 2015-05-29 DIAGNOSIS — D509 Iron deficiency anemia, unspecified: Secondary | ICD-10-CM | POA: Diagnosis not present

## 2015-05-29 DIAGNOSIS — Z992 Dependence on renal dialysis: Secondary | ICD-10-CM | POA: Diagnosis not present

## 2015-05-29 DIAGNOSIS — N186 End stage renal disease: Secondary | ICD-10-CM | POA: Diagnosis not present

## 2015-06-01 DIAGNOSIS — N186 End stage renal disease: Secondary | ICD-10-CM | POA: Diagnosis not present

## 2015-06-01 DIAGNOSIS — D509 Iron deficiency anemia, unspecified: Secondary | ICD-10-CM | POA: Diagnosis not present

## 2015-06-01 DIAGNOSIS — Z992 Dependence on renal dialysis: Secondary | ICD-10-CM | POA: Diagnosis not present

## 2015-06-03 DIAGNOSIS — D509 Iron deficiency anemia, unspecified: Secondary | ICD-10-CM | POA: Diagnosis not present

## 2015-06-03 DIAGNOSIS — N186 End stage renal disease: Secondary | ICD-10-CM | POA: Diagnosis not present

## 2015-06-03 DIAGNOSIS — Z992 Dependence on renal dialysis: Secondary | ICD-10-CM | POA: Diagnosis not present

## 2015-06-04 DIAGNOSIS — N185 Chronic kidney disease, stage 5: Secondary | ICD-10-CM | POA: Diagnosis not present

## 2015-06-04 DIAGNOSIS — M545 Low back pain: Secondary | ICD-10-CM | POA: Diagnosis not present

## 2015-06-04 DIAGNOSIS — E1121 Type 2 diabetes mellitus with diabetic nephropathy: Secondary | ICD-10-CM | POA: Diagnosis not present

## 2015-06-04 DIAGNOSIS — I10 Essential (primary) hypertension: Secondary | ICD-10-CM | POA: Diagnosis not present

## 2015-06-05 DIAGNOSIS — N186 End stage renal disease: Secondary | ICD-10-CM | POA: Diagnosis not present

## 2015-06-05 DIAGNOSIS — D509 Iron deficiency anemia, unspecified: Secondary | ICD-10-CM | POA: Diagnosis not present

## 2015-06-05 DIAGNOSIS — Z992 Dependence on renal dialysis: Secondary | ICD-10-CM | POA: Diagnosis not present

## 2015-06-08 DIAGNOSIS — Z992 Dependence on renal dialysis: Secondary | ICD-10-CM | POA: Diagnosis not present

## 2015-06-08 DIAGNOSIS — D509 Iron deficiency anemia, unspecified: Secondary | ICD-10-CM | POA: Diagnosis not present

## 2015-06-08 DIAGNOSIS — N186 End stage renal disease: Secondary | ICD-10-CM | POA: Diagnosis not present

## 2015-06-10 ENCOUNTER — Other Ambulatory Visit (INDEPENDENT_AMBULATORY_CARE_PROVIDER_SITE_OTHER): Payer: Self-pay | Admitting: Internal Medicine

## 2015-06-10 DIAGNOSIS — N186 End stage renal disease: Secondary | ICD-10-CM | POA: Diagnosis not present

## 2015-06-10 DIAGNOSIS — Z992 Dependence on renal dialysis: Secondary | ICD-10-CM | POA: Diagnosis not present

## 2015-06-10 DIAGNOSIS — D509 Iron deficiency anemia, unspecified: Secondary | ICD-10-CM | POA: Diagnosis not present

## 2015-06-12 DIAGNOSIS — Z992 Dependence on renal dialysis: Secondary | ICD-10-CM | POA: Diagnosis not present

## 2015-06-12 DIAGNOSIS — D509 Iron deficiency anemia, unspecified: Secondary | ICD-10-CM | POA: Diagnosis not present

## 2015-06-12 DIAGNOSIS — N186 End stage renal disease: Secondary | ICD-10-CM | POA: Diagnosis not present

## 2015-06-15 DIAGNOSIS — Z992 Dependence on renal dialysis: Secondary | ICD-10-CM | POA: Diagnosis not present

## 2015-06-15 DIAGNOSIS — D509 Iron deficiency anemia, unspecified: Secondary | ICD-10-CM | POA: Diagnosis not present

## 2015-06-15 DIAGNOSIS — N186 End stage renal disease: Secondary | ICD-10-CM | POA: Diagnosis not present

## 2015-06-16 DIAGNOSIS — H33012 Retinal detachment with single break, left eye: Secondary | ICD-10-CM | POA: Diagnosis not present

## 2015-06-16 DIAGNOSIS — H26492 Other secondary cataract, left eye: Secondary | ICD-10-CM | POA: Diagnosis not present

## 2015-06-16 DIAGNOSIS — H401121 Primary open-angle glaucoma, left eye, mild stage: Secondary | ICD-10-CM | POA: Diagnosis not present

## 2015-06-16 DIAGNOSIS — T8522XA Displacement of intraocular lens, initial encounter: Secondary | ICD-10-CM | POA: Diagnosis not present

## 2015-06-16 DIAGNOSIS — Z961 Presence of intraocular lens: Secondary | ICD-10-CM | POA: Diagnosis not present

## 2015-06-17 DIAGNOSIS — Z992 Dependence on renal dialysis: Secondary | ICD-10-CM | POA: Diagnosis not present

## 2015-06-17 DIAGNOSIS — D509 Iron deficiency anemia, unspecified: Secondary | ICD-10-CM | POA: Diagnosis not present

## 2015-06-17 DIAGNOSIS — N186 End stage renal disease: Secondary | ICD-10-CM | POA: Diagnosis not present

## 2015-06-19 DIAGNOSIS — D509 Iron deficiency anemia, unspecified: Secondary | ICD-10-CM | POA: Diagnosis not present

## 2015-06-19 DIAGNOSIS — Z992 Dependence on renal dialysis: Secondary | ICD-10-CM | POA: Diagnosis not present

## 2015-06-19 DIAGNOSIS — N186 End stage renal disease: Secondary | ICD-10-CM | POA: Diagnosis not present

## 2015-06-22 DIAGNOSIS — D509 Iron deficiency anemia, unspecified: Secondary | ICD-10-CM | POA: Diagnosis not present

## 2015-06-22 DIAGNOSIS — N186 End stage renal disease: Secondary | ICD-10-CM | POA: Diagnosis not present

## 2015-06-22 DIAGNOSIS — Z992 Dependence on renal dialysis: Secondary | ICD-10-CM | POA: Diagnosis not present

## 2015-06-23 DIAGNOSIS — H27132 Posterior dislocation of lens, left eye: Secondary | ICD-10-CM | POA: Diagnosis not present

## 2015-06-23 DIAGNOSIS — H3342 Traction detachment of retina, left eye: Secondary | ICD-10-CM | POA: Diagnosis not present

## 2015-06-23 DIAGNOSIS — T8522XA Displacement of intraocular lens, initial encounter: Secondary | ICD-10-CM | POA: Diagnosis not present

## 2015-06-23 DIAGNOSIS — H33012 Retinal detachment with single break, left eye: Secondary | ICD-10-CM | POA: Diagnosis not present

## 2015-06-24 DIAGNOSIS — N186 End stage renal disease: Secondary | ICD-10-CM | POA: Diagnosis not present

## 2015-06-24 DIAGNOSIS — Z992 Dependence on renal dialysis: Secondary | ICD-10-CM | POA: Diagnosis not present

## 2015-06-24 DIAGNOSIS — D509 Iron deficiency anemia, unspecified: Secondary | ICD-10-CM | POA: Diagnosis not present

## 2015-06-26 DIAGNOSIS — Z992 Dependence on renal dialysis: Secondary | ICD-10-CM | POA: Diagnosis not present

## 2015-06-26 DIAGNOSIS — N186 End stage renal disease: Secondary | ICD-10-CM | POA: Diagnosis not present

## 2015-06-26 DIAGNOSIS — D509 Iron deficiency anemia, unspecified: Secondary | ICD-10-CM | POA: Diagnosis not present

## 2015-06-29 DIAGNOSIS — N186 End stage renal disease: Secondary | ICD-10-CM | POA: Diagnosis not present

## 2015-06-29 DIAGNOSIS — D509 Iron deficiency anemia, unspecified: Secondary | ICD-10-CM | POA: Diagnosis not present

## 2015-06-29 DIAGNOSIS — Z992 Dependence on renal dialysis: Secondary | ICD-10-CM | POA: Diagnosis not present

## 2015-06-30 DIAGNOSIS — H40042 Steroid responder, left eye: Secondary | ICD-10-CM | POA: Diagnosis not present

## 2015-06-30 DIAGNOSIS — H4062X1 Glaucoma secondary to drugs, left eye, mild stage: Secondary | ICD-10-CM | POA: Diagnosis not present

## 2015-07-01 DIAGNOSIS — D509 Iron deficiency anemia, unspecified: Secondary | ICD-10-CM | POA: Diagnosis not present

## 2015-07-01 DIAGNOSIS — Z992 Dependence on renal dialysis: Secondary | ICD-10-CM | POA: Diagnosis not present

## 2015-07-01 DIAGNOSIS — N186 End stage renal disease: Secondary | ICD-10-CM | POA: Diagnosis not present

## 2015-07-03 DIAGNOSIS — D509 Iron deficiency anemia, unspecified: Secondary | ICD-10-CM | POA: Diagnosis not present

## 2015-07-03 DIAGNOSIS — Z992 Dependence on renal dialysis: Secondary | ICD-10-CM | POA: Diagnosis not present

## 2015-07-03 DIAGNOSIS — N186 End stage renal disease: Secondary | ICD-10-CM | POA: Diagnosis not present

## 2015-07-06 DIAGNOSIS — N186 End stage renal disease: Secondary | ICD-10-CM | POA: Diagnosis not present

## 2015-07-06 DIAGNOSIS — Z992 Dependence on renal dialysis: Secondary | ICD-10-CM | POA: Diagnosis not present

## 2015-07-06 DIAGNOSIS — D509 Iron deficiency anemia, unspecified: Secondary | ICD-10-CM | POA: Diagnosis not present

## 2015-07-07 ENCOUNTER — Other Ambulatory Visit (INDEPENDENT_AMBULATORY_CARE_PROVIDER_SITE_OTHER): Payer: Self-pay | Admitting: Internal Medicine

## 2015-07-08 DIAGNOSIS — Z992 Dependence on renal dialysis: Secondary | ICD-10-CM | POA: Diagnosis not present

## 2015-07-08 DIAGNOSIS — N186 End stage renal disease: Secondary | ICD-10-CM | POA: Diagnosis not present

## 2015-07-08 DIAGNOSIS — D509 Iron deficiency anemia, unspecified: Secondary | ICD-10-CM | POA: Diagnosis not present

## 2015-07-10 DIAGNOSIS — Z992 Dependence on renal dialysis: Secondary | ICD-10-CM | POA: Diagnosis not present

## 2015-07-10 DIAGNOSIS — N186 End stage renal disease: Secondary | ICD-10-CM | POA: Diagnosis not present

## 2015-07-10 DIAGNOSIS — D509 Iron deficiency anemia, unspecified: Secondary | ICD-10-CM | POA: Diagnosis not present

## 2015-07-12 DIAGNOSIS — N186 End stage renal disease: Secondary | ICD-10-CM | POA: Diagnosis not present

## 2015-07-12 DIAGNOSIS — Z992 Dependence on renal dialysis: Secondary | ICD-10-CM | POA: Diagnosis not present

## 2015-07-13 DIAGNOSIS — N186 End stage renal disease: Secondary | ICD-10-CM | POA: Diagnosis not present

## 2015-07-13 DIAGNOSIS — Z992 Dependence on renal dialysis: Secondary | ICD-10-CM | POA: Diagnosis not present

## 2015-07-13 DIAGNOSIS — D509 Iron deficiency anemia, unspecified: Secondary | ICD-10-CM | POA: Diagnosis not present

## 2015-07-15 DIAGNOSIS — N186 End stage renal disease: Secondary | ICD-10-CM | POA: Diagnosis not present

## 2015-07-15 DIAGNOSIS — Z992 Dependence on renal dialysis: Secondary | ICD-10-CM | POA: Diagnosis not present

## 2015-07-15 DIAGNOSIS — D509 Iron deficiency anemia, unspecified: Secondary | ICD-10-CM | POA: Diagnosis not present

## 2015-07-17 DIAGNOSIS — Z992 Dependence on renal dialysis: Secondary | ICD-10-CM | POA: Diagnosis not present

## 2015-07-17 DIAGNOSIS — N186 End stage renal disease: Secondary | ICD-10-CM | POA: Diagnosis not present

## 2015-07-17 DIAGNOSIS — D509 Iron deficiency anemia, unspecified: Secondary | ICD-10-CM | POA: Diagnosis not present

## 2015-07-20 DIAGNOSIS — D509 Iron deficiency anemia, unspecified: Secondary | ICD-10-CM | POA: Diagnosis not present

## 2015-07-20 DIAGNOSIS — N186 End stage renal disease: Secondary | ICD-10-CM | POA: Diagnosis not present

## 2015-07-20 DIAGNOSIS — Z992 Dependence on renal dialysis: Secondary | ICD-10-CM | POA: Diagnosis not present

## 2015-07-21 DIAGNOSIS — L851 Acquired keratosis [keratoderma] palmaris et plantaris: Secondary | ICD-10-CM | POA: Diagnosis not present

## 2015-07-21 DIAGNOSIS — H401131 Primary open-angle glaucoma, bilateral, mild stage: Secondary | ICD-10-CM | POA: Diagnosis not present

## 2015-07-21 DIAGNOSIS — E1142 Type 2 diabetes mellitus with diabetic polyneuropathy: Secondary | ICD-10-CM | POA: Diagnosis not present

## 2015-07-21 DIAGNOSIS — B351 Tinea unguium: Secondary | ICD-10-CM | POA: Diagnosis not present

## 2015-07-22 DIAGNOSIS — Z992 Dependence on renal dialysis: Secondary | ICD-10-CM | POA: Diagnosis not present

## 2015-07-22 DIAGNOSIS — N186 End stage renal disease: Secondary | ICD-10-CM | POA: Diagnosis not present

## 2015-07-22 DIAGNOSIS — D509 Iron deficiency anemia, unspecified: Secondary | ICD-10-CM | POA: Diagnosis not present

## 2015-07-24 DIAGNOSIS — N186 End stage renal disease: Secondary | ICD-10-CM | POA: Diagnosis not present

## 2015-07-24 DIAGNOSIS — Z992 Dependence on renal dialysis: Secondary | ICD-10-CM | POA: Diagnosis not present

## 2015-07-24 DIAGNOSIS — D509 Iron deficiency anemia, unspecified: Secondary | ICD-10-CM | POA: Diagnosis not present

## 2015-07-27 DIAGNOSIS — D509 Iron deficiency anemia, unspecified: Secondary | ICD-10-CM | POA: Diagnosis not present

## 2015-07-27 DIAGNOSIS — Z992 Dependence on renal dialysis: Secondary | ICD-10-CM | POA: Diagnosis not present

## 2015-07-27 DIAGNOSIS — N186 End stage renal disease: Secondary | ICD-10-CM | POA: Diagnosis not present

## 2015-07-29 DIAGNOSIS — Z992 Dependence on renal dialysis: Secondary | ICD-10-CM | POA: Diagnosis not present

## 2015-07-29 DIAGNOSIS — N186 End stage renal disease: Secondary | ICD-10-CM | POA: Diagnosis not present

## 2015-07-29 DIAGNOSIS — D509 Iron deficiency anemia, unspecified: Secondary | ICD-10-CM | POA: Diagnosis not present

## 2015-07-31 DIAGNOSIS — D509 Iron deficiency anemia, unspecified: Secondary | ICD-10-CM | POA: Diagnosis not present

## 2015-07-31 DIAGNOSIS — Z992 Dependence on renal dialysis: Secondary | ICD-10-CM | POA: Diagnosis not present

## 2015-07-31 DIAGNOSIS — N186 End stage renal disease: Secondary | ICD-10-CM | POA: Diagnosis not present

## 2015-08-03 DIAGNOSIS — N186 End stage renal disease: Secondary | ICD-10-CM | POA: Diagnosis not present

## 2015-08-03 DIAGNOSIS — D509 Iron deficiency anemia, unspecified: Secondary | ICD-10-CM | POA: Diagnosis not present

## 2015-08-03 DIAGNOSIS — Z992 Dependence on renal dialysis: Secondary | ICD-10-CM | POA: Diagnosis not present

## 2015-08-04 ENCOUNTER — Other Ambulatory Visit (INDEPENDENT_AMBULATORY_CARE_PROVIDER_SITE_OTHER): Payer: Self-pay | Admitting: Internal Medicine

## 2015-08-05 DIAGNOSIS — N186 End stage renal disease: Secondary | ICD-10-CM | POA: Diagnosis not present

## 2015-08-05 DIAGNOSIS — D509 Iron deficiency anemia, unspecified: Secondary | ICD-10-CM | POA: Diagnosis not present

## 2015-08-05 DIAGNOSIS — Z992 Dependence on renal dialysis: Secondary | ICD-10-CM | POA: Diagnosis not present

## 2015-08-07 DIAGNOSIS — D509 Iron deficiency anemia, unspecified: Secondary | ICD-10-CM | POA: Diagnosis not present

## 2015-08-07 DIAGNOSIS — N186 End stage renal disease: Secondary | ICD-10-CM | POA: Diagnosis not present

## 2015-08-07 DIAGNOSIS — Z992 Dependence on renal dialysis: Secondary | ICD-10-CM | POA: Diagnosis not present

## 2015-08-10 DIAGNOSIS — N186 End stage renal disease: Secondary | ICD-10-CM | POA: Diagnosis not present

## 2015-08-10 DIAGNOSIS — D509 Iron deficiency anemia, unspecified: Secondary | ICD-10-CM | POA: Diagnosis not present

## 2015-08-10 DIAGNOSIS — Z992 Dependence on renal dialysis: Secondary | ICD-10-CM | POA: Diagnosis not present

## 2015-08-12 DIAGNOSIS — D509 Iron deficiency anemia, unspecified: Secondary | ICD-10-CM | POA: Diagnosis not present

## 2015-08-12 DIAGNOSIS — Z992 Dependence on renal dialysis: Secondary | ICD-10-CM | POA: Diagnosis not present

## 2015-08-12 DIAGNOSIS — N186 End stage renal disease: Secondary | ICD-10-CM | POA: Diagnosis not present

## 2015-08-14 DIAGNOSIS — D509 Iron deficiency anemia, unspecified: Secondary | ICD-10-CM | POA: Diagnosis not present

## 2015-08-14 DIAGNOSIS — N186 End stage renal disease: Secondary | ICD-10-CM | POA: Diagnosis not present

## 2015-08-14 DIAGNOSIS — Z992 Dependence on renal dialysis: Secondary | ICD-10-CM | POA: Diagnosis not present

## 2015-09-03 DIAGNOSIS — E1121 Type 2 diabetes mellitus with diabetic nephropathy: Secondary | ICD-10-CM | POA: Diagnosis not present

## 2015-09-03 DIAGNOSIS — N185 Chronic kidney disease, stage 5: Secondary | ICD-10-CM | POA: Diagnosis not present

## 2015-09-03 DIAGNOSIS — I10 Essential (primary) hypertension: Secondary | ICD-10-CM | POA: Diagnosis not present

## 2015-09-03 DIAGNOSIS — I120 Hypertensive chronic kidney disease with stage 5 chronic kidney disease or end stage renal disease: Secondary | ICD-10-CM | POA: Diagnosis not present

## 2015-09-11 DIAGNOSIS — Z992 Dependence on renal dialysis: Secondary | ICD-10-CM | POA: Diagnosis not present

## 2015-09-11 DIAGNOSIS — N186 End stage renal disease: Secondary | ICD-10-CM | POA: Diagnosis not present

## 2015-09-14 DIAGNOSIS — N186 End stage renal disease: Secondary | ICD-10-CM | POA: Diagnosis not present

## 2015-09-14 DIAGNOSIS — D509 Iron deficiency anemia, unspecified: Secondary | ICD-10-CM | POA: Diagnosis not present

## 2015-09-14 DIAGNOSIS — N2581 Secondary hyperparathyroidism of renal origin: Secondary | ICD-10-CM | POA: Diagnosis not present

## 2015-09-14 DIAGNOSIS — Z992 Dependence on renal dialysis: Secondary | ICD-10-CM | POA: Diagnosis not present

## 2015-09-16 DIAGNOSIS — N2581 Secondary hyperparathyroidism of renal origin: Secondary | ICD-10-CM | POA: Diagnosis not present

## 2015-09-16 DIAGNOSIS — N186 End stage renal disease: Secondary | ICD-10-CM | POA: Diagnosis not present

## 2015-09-16 DIAGNOSIS — D509 Iron deficiency anemia, unspecified: Secondary | ICD-10-CM | POA: Diagnosis not present

## 2015-09-16 DIAGNOSIS — Z992 Dependence on renal dialysis: Secondary | ICD-10-CM | POA: Diagnosis not present

## 2015-09-18 DIAGNOSIS — Z992 Dependence on renal dialysis: Secondary | ICD-10-CM | POA: Diagnosis not present

## 2015-09-18 DIAGNOSIS — N186 End stage renal disease: Secondary | ICD-10-CM | POA: Diagnosis not present

## 2015-09-18 DIAGNOSIS — N2581 Secondary hyperparathyroidism of renal origin: Secondary | ICD-10-CM | POA: Diagnosis not present

## 2015-09-18 DIAGNOSIS — D509 Iron deficiency anemia, unspecified: Secondary | ICD-10-CM | POA: Diagnosis not present

## 2015-09-21 DIAGNOSIS — N2581 Secondary hyperparathyroidism of renal origin: Secondary | ICD-10-CM | POA: Diagnosis not present

## 2015-09-21 DIAGNOSIS — Z992 Dependence on renal dialysis: Secondary | ICD-10-CM | POA: Diagnosis not present

## 2015-09-21 DIAGNOSIS — N186 End stage renal disease: Secondary | ICD-10-CM | POA: Diagnosis not present

## 2015-09-21 DIAGNOSIS — D509 Iron deficiency anemia, unspecified: Secondary | ICD-10-CM | POA: Diagnosis not present

## 2015-09-23 DIAGNOSIS — N186 End stage renal disease: Secondary | ICD-10-CM | POA: Diagnosis not present

## 2015-09-23 DIAGNOSIS — Z992 Dependence on renal dialysis: Secondary | ICD-10-CM | POA: Diagnosis not present

## 2015-09-23 DIAGNOSIS — D509 Iron deficiency anemia, unspecified: Secondary | ICD-10-CM | POA: Diagnosis not present

## 2015-09-23 DIAGNOSIS — N2581 Secondary hyperparathyroidism of renal origin: Secondary | ICD-10-CM | POA: Diagnosis not present

## 2015-09-25 DIAGNOSIS — N186 End stage renal disease: Secondary | ICD-10-CM | POA: Diagnosis not present

## 2015-09-25 DIAGNOSIS — N2581 Secondary hyperparathyroidism of renal origin: Secondary | ICD-10-CM | POA: Diagnosis not present

## 2015-09-25 DIAGNOSIS — Z992 Dependence on renal dialysis: Secondary | ICD-10-CM | POA: Diagnosis not present

## 2015-09-25 DIAGNOSIS — D509 Iron deficiency anemia, unspecified: Secondary | ICD-10-CM | POA: Diagnosis not present

## 2015-09-28 DIAGNOSIS — Z992 Dependence on renal dialysis: Secondary | ICD-10-CM | POA: Diagnosis not present

## 2015-09-28 DIAGNOSIS — D509 Iron deficiency anemia, unspecified: Secondary | ICD-10-CM | POA: Diagnosis not present

## 2015-09-28 DIAGNOSIS — N186 End stage renal disease: Secondary | ICD-10-CM | POA: Diagnosis not present

## 2015-09-28 DIAGNOSIS — N2581 Secondary hyperparathyroidism of renal origin: Secondary | ICD-10-CM | POA: Diagnosis not present

## 2015-09-29 DIAGNOSIS — E1142 Type 2 diabetes mellitus with diabetic polyneuropathy: Secondary | ICD-10-CM | POA: Diagnosis not present

## 2015-09-29 DIAGNOSIS — B351 Tinea unguium: Secondary | ICD-10-CM | POA: Diagnosis not present

## 2015-09-29 DIAGNOSIS — L851 Acquired keratosis [keratoderma] palmaris et plantaris: Secondary | ICD-10-CM | POA: Diagnosis not present

## 2015-09-30 DIAGNOSIS — D509 Iron deficiency anemia, unspecified: Secondary | ICD-10-CM | POA: Diagnosis not present

## 2015-09-30 DIAGNOSIS — N186 End stage renal disease: Secondary | ICD-10-CM | POA: Diagnosis not present

## 2015-09-30 DIAGNOSIS — Z992 Dependence on renal dialysis: Secondary | ICD-10-CM | POA: Diagnosis not present

## 2015-09-30 DIAGNOSIS — N2581 Secondary hyperparathyroidism of renal origin: Secondary | ICD-10-CM | POA: Diagnosis not present

## 2015-10-02 DIAGNOSIS — Z992 Dependence on renal dialysis: Secondary | ICD-10-CM | POA: Diagnosis not present

## 2015-10-02 DIAGNOSIS — N186 End stage renal disease: Secondary | ICD-10-CM | POA: Diagnosis not present

## 2015-10-02 DIAGNOSIS — D509 Iron deficiency anemia, unspecified: Secondary | ICD-10-CM | POA: Diagnosis not present

## 2015-10-02 DIAGNOSIS — N2581 Secondary hyperparathyroidism of renal origin: Secondary | ICD-10-CM | POA: Diagnosis not present

## 2015-10-05 DIAGNOSIS — N2581 Secondary hyperparathyroidism of renal origin: Secondary | ICD-10-CM | POA: Diagnosis not present

## 2015-10-05 DIAGNOSIS — N186 End stage renal disease: Secondary | ICD-10-CM | POA: Diagnosis not present

## 2015-10-05 DIAGNOSIS — D509 Iron deficiency anemia, unspecified: Secondary | ICD-10-CM | POA: Diagnosis not present

## 2015-10-05 DIAGNOSIS — Z992 Dependence on renal dialysis: Secondary | ICD-10-CM | POA: Diagnosis not present

## 2015-10-07 DIAGNOSIS — N2581 Secondary hyperparathyroidism of renal origin: Secondary | ICD-10-CM | POA: Diagnosis not present

## 2015-10-07 DIAGNOSIS — D509 Iron deficiency anemia, unspecified: Secondary | ICD-10-CM | POA: Diagnosis not present

## 2015-10-07 DIAGNOSIS — N186 End stage renal disease: Secondary | ICD-10-CM | POA: Diagnosis not present

## 2015-10-07 DIAGNOSIS — Z992 Dependence on renal dialysis: Secondary | ICD-10-CM | POA: Diagnosis not present

## 2015-10-09 DIAGNOSIS — D509 Iron deficiency anemia, unspecified: Secondary | ICD-10-CM | POA: Diagnosis not present

## 2015-10-09 DIAGNOSIS — Z992 Dependence on renal dialysis: Secondary | ICD-10-CM | POA: Diagnosis not present

## 2015-10-09 DIAGNOSIS — N186 End stage renal disease: Secondary | ICD-10-CM | POA: Diagnosis not present

## 2015-10-09 DIAGNOSIS — N2581 Secondary hyperparathyroidism of renal origin: Secondary | ICD-10-CM | POA: Diagnosis not present

## 2015-10-12 DIAGNOSIS — N2581 Secondary hyperparathyroidism of renal origin: Secondary | ICD-10-CM | POA: Diagnosis not present

## 2015-10-12 DIAGNOSIS — Z992 Dependence on renal dialysis: Secondary | ICD-10-CM | POA: Diagnosis not present

## 2015-10-12 DIAGNOSIS — N186 End stage renal disease: Secondary | ICD-10-CM | POA: Diagnosis not present

## 2015-10-12 DIAGNOSIS — D509 Iron deficiency anemia, unspecified: Secondary | ICD-10-CM | POA: Diagnosis not present

## 2015-10-14 ENCOUNTER — Encounter (INDEPENDENT_AMBULATORY_CARE_PROVIDER_SITE_OTHER): Payer: Self-pay | Admitting: Internal Medicine

## 2015-10-14 DIAGNOSIS — N2581 Secondary hyperparathyroidism of renal origin: Secondary | ICD-10-CM | POA: Diagnosis not present

## 2015-10-14 DIAGNOSIS — Z992 Dependence on renal dialysis: Secondary | ICD-10-CM | POA: Diagnosis not present

## 2015-10-14 DIAGNOSIS — D509 Iron deficiency anemia, unspecified: Secondary | ICD-10-CM | POA: Diagnosis not present

## 2015-10-14 DIAGNOSIS — N186 End stage renal disease: Secondary | ICD-10-CM | POA: Diagnosis not present

## 2015-10-16 DIAGNOSIS — D509 Iron deficiency anemia, unspecified: Secondary | ICD-10-CM | POA: Diagnosis not present

## 2015-10-16 DIAGNOSIS — N2581 Secondary hyperparathyroidism of renal origin: Secondary | ICD-10-CM | POA: Diagnosis not present

## 2015-10-16 DIAGNOSIS — N186 End stage renal disease: Secondary | ICD-10-CM | POA: Diagnosis not present

## 2015-10-16 DIAGNOSIS — Z992 Dependence on renal dialysis: Secondary | ICD-10-CM | POA: Diagnosis not present

## 2015-10-19 DIAGNOSIS — D509 Iron deficiency anemia, unspecified: Secondary | ICD-10-CM | POA: Diagnosis not present

## 2015-10-19 DIAGNOSIS — N186 End stage renal disease: Secondary | ICD-10-CM | POA: Diagnosis not present

## 2015-10-19 DIAGNOSIS — Z992 Dependence on renal dialysis: Secondary | ICD-10-CM | POA: Diagnosis not present

## 2015-10-19 DIAGNOSIS — N2581 Secondary hyperparathyroidism of renal origin: Secondary | ICD-10-CM | POA: Diagnosis not present

## 2015-10-21 DIAGNOSIS — D509 Iron deficiency anemia, unspecified: Secondary | ICD-10-CM | POA: Diagnosis not present

## 2015-10-21 DIAGNOSIS — Z992 Dependence on renal dialysis: Secondary | ICD-10-CM | POA: Diagnosis not present

## 2015-10-21 DIAGNOSIS — N2581 Secondary hyperparathyroidism of renal origin: Secondary | ICD-10-CM | POA: Diagnosis not present

## 2015-10-21 DIAGNOSIS — N186 End stage renal disease: Secondary | ICD-10-CM | POA: Diagnosis not present

## 2015-10-23 DIAGNOSIS — D509 Iron deficiency anemia, unspecified: Secondary | ICD-10-CM | POA: Diagnosis not present

## 2015-10-23 DIAGNOSIS — Z992 Dependence on renal dialysis: Secondary | ICD-10-CM | POA: Diagnosis not present

## 2015-10-23 DIAGNOSIS — N2581 Secondary hyperparathyroidism of renal origin: Secondary | ICD-10-CM | POA: Diagnosis not present

## 2015-10-23 DIAGNOSIS — N186 End stage renal disease: Secondary | ICD-10-CM | POA: Diagnosis not present

## 2015-10-26 DIAGNOSIS — N186 End stage renal disease: Secondary | ICD-10-CM | POA: Diagnosis not present

## 2015-10-26 DIAGNOSIS — N2581 Secondary hyperparathyroidism of renal origin: Secondary | ICD-10-CM | POA: Diagnosis not present

## 2015-10-26 DIAGNOSIS — Z992 Dependence on renal dialysis: Secondary | ICD-10-CM | POA: Diagnosis not present

## 2015-10-26 DIAGNOSIS — D509 Iron deficiency anemia, unspecified: Secondary | ICD-10-CM | POA: Diagnosis not present

## 2015-10-27 DIAGNOSIS — H401132 Primary open-angle glaucoma, bilateral, moderate stage: Secondary | ICD-10-CM | POA: Diagnosis not present

## 2015-10-28 DIAGNOSIS — Z992 Dependence on renal dialysis: Secondary | ICD-10-CM | POA: Diagnosis not present

## 2015-10-28 DIAGNOSIS — N186 End stage renal disease: Secondary | ICD-10-CM | POA: Diagnosis not present

## 2015-10-28 DIAGNOSIS — N2581 Secondary hyperparathyroidism of renal origin: Secondary | ICD-10-CM | POA: Diagnosis not present

## 2015-10-28 DIAGNOSIS — D509 Iron deficiency anemia, unspecified: Secondary | ICD-10-CM | POA: Diagnosis not present

## 2015-10-30 DIAGNOSIS — Z992 Dependence on renal dialysis: Secondary | ICD-10-CM | POA: Diagnosis not present

## 2015-10-30 DIAGNOSIS — D509 Iron deficiency anemia, unspecified: Secondary | ICD-10-CM | POA: Diagnosis not present

## 2015-10-30 DIAGNOSIS — N2581 Secondary hyperparathyroidism of renal origin: Secondary | ICD-10-CM | POA: Diagnosis not present

## 2015-10-30 DIAGNOSIS — N186 End stage renal disease: Secondary | ICD-10-CM | POA: Diagnosis not present

## 2015-11-02 DIAGNOSIS — Z992 Dependence on renal dialysis: Secondary | ICD-10-CM | POA: Diagnosis not present

## 2015-11-02 DIAGNOSIS — D509 Iron deficiency anemia, unspecified: Secondary | ICD-10-CM | POA: Diagnosis not present

## 2015-11-02 DIAGNOSIS — N2581 Secondary hyperparathyroidism of renal origin: Secondary | ICD-10-CM | POA: Diagnosis not present

## 2015-11-02 DIAGNOSIS — N186 End stage renal disease: Secondary | ICD-10-CM | POA: Diagnosis not present

## 2015-11-04 DIAGNOSIS — D509 Iron deficiency anemia, unspecified: Secondary | ICD-10-CM | POA: Diagnosis not present

## 2015-11-04 DIAGNOSIS — N2581 Secondary hyperparathyroidism of renal origin: Secondary | ICD-10-CM | POA: Diagnosis not present

## 2015-11-04 DIAGNOSIS — N186 End stage renal disease: Secondary | ICD-10-CM | POA: Diagnosis not present

## 2015-11-04 DIAGNOSIS — Z992 Dependence on renal dialysis: Secondary | ICD-10-CM | POA: Diagnosis not present

## 2015-11-06 DIAGNOSIS — Z992 Dependence on renal dialysis: Secondary | ICD-10-CM | POA: Diagnosis not present

## 2015-11-06 DIAGNOSIS — D509 Iron deficiency anemia, unspecified: Secondary | ICD-10-CM | POA: Diagnosis not present

## 2015-11-06 DIAGNOSIS — N186 End stage renal disease: Secondary | ICD-10-CM | POA: Diagnosis not present

## 2015-11-06 DIAGNOSIS — N2581 Secondary hyperparathyroidism of renal origin: Secondary | ICD-10-CM | POA: Diagnosis not present

## 2015-11-09 DIAGNOSIS — N2581 Secondary hyperparathyroidism of renal origin: Secondary | ICD-10-CM | POA: Diagnosis not present

## 2015-11-09 DIAGNOSIS — Z992 Dependence on renal dialysis: Secondary | ICD-10-CM | POA: Diagnosis not present

## 2015-11-09 DIAGNOSIS — N186 End stage renal disease: Secondary | ICD-10-CM | POA: Diagnosis not present

## 2015-11-09 DIAGNOSIS — D509 Iron deficiency anemia, unspecified: Secondary | ICD-10-CM | POA: Diagnosis not present

## 2015-11-10 ENCOUNTER — Other Ambulatory Visit: Payer: Self-pay

## 2015-11-11 DIAGNOSIS — N186 End stage renal disease: Secondary | ICD-10-CM | POA: Diagnosis not present

## 2015-11-11 DIAGNOSIS — D509 Iron deficiency anemia, unspecified: Secondary | ICD-10-CM | POA: Diagnosis not present

## 2015-11-11 DIAGNOSIS — N2581 Secondary hyperparathyroidism of renal origin: Secondary | ICD-10-CM | POA: Diagnosis not present

## 2015-11-11 DIAGNOSIS — Z992 Dependence on renal dialysis: Secondary | ICD-10-CM | POA: Diagnosis not present

## 2015-11-12 DIAGNOSIS — N186 End stage renal disease: Secondary | ICD-10-CM | POA: Diagnosis not present

## 2015-11-12 DIAGNOSIS — Z992 Dependence on renal dialysis: Secondary | ICD-10-CM | POA: Diagnosis not present

## 2015-11-13 DIAGNOSIS — N2581 Secondary hyperparathyroidism of renal origin: Secondary | ICD-10-CM | POA: Diagnosis not present

## 2015-11-13 DIAGNOSIS — N186 End stage renal disease: Secondary | ICD-10-CM | POA: Diagnosis not present

## 2015-11-13 DIAGNOSIS — Z992 Dependence on renal dialysis: Secondary | ICD-10-CM | POA: Diagnosis not present

## 2015-11-13 DIAGNOSIS — Z23 Encounter for immunization: Secondary | ICD-10-CM | POA: Diagnosis not present

## 2015-11-13 DIAGNOSIS — D509 Iron deficiency anemia, unspecified: Secondary | ICD-10-CM | POA: Diagnosis not present

## 2015-11-16 DIAGNOSIS — D509 Iron deficiency anemia, unspecified: Secondary | ICD-10-CM | POA: Diagnosis not present

## 2015-11-16 DIAGNOSIS — Z992 Dependence on renal dialysis: Secondary | ICD-10-CM | POA: Diagnosis not present

## 2015-11-16 DIAGNOSIS — Z23 Encounter for immunization: Secondary | ICD-10-CM | POA: Diagnosis not present

## 2015-11-16 DIAGNOSIS — N186 End stage renal disease: Secondary | ICD-10-CM | POA: Diagnosis not present

## 2015-11-16 DIAGNOSIS — N2581 Secondary hyperparathyroidism of renal origin: Secondary | ICD-10-CM | POA: Diagnosis not present

## 2015-11-18 DIAGNOSIS — D509 Iron deficiency anemia, unspecified: Secondary | ICD-10-CM | POA: Diagnosis not present

## 2015-11-18 DIAGNOSIS — N186 End stage renal disease: Secondary | ICD-10-CM | POA: Diagnosis not present

## 2015-11-18 DIAGNOSIS — N2581 Secondary hyperparathyroidism of renal origin: Secondary | ICD-10-CM | POA: Diagnosis not present

## 2015-11-18 DIAGNOSIS — Z992 Dependence on renal dialysis: Secondary | ICD-10-CM | POA: Diagnosis not present

## 2015-11-18 DIAGNOSIS — Z23 Encounter for immunization: Secondary | ICD-10-CM | POA: Diagnosis not present

## 2015-11-20 DIAGNOSIS — N2581 Secondary hyperparathyroidism of renal origin: Secondary | ICD-10-CM | POA: Diagnosis not present

## 2015-11-20 DIAGNOSIS — D509 Iron deficiency anemia, unspecified: Secondary | ICD-10-CM | POA: Diagnosis not present

## 2015-11-20 DIAGNOSIS — Z992 Dependence on renal dialysis: Secondary | ICD-10-CM | POA: Diagnosis not present

## 2015-11-20 DIAGNOSIS — Z23 Encounter for immunization: Secondary | ICD-10-CM | POA: Diagnosis not present

## 2015-11-20 DIAGNOSIS — N186 End stage renal disease: Secondary | ICD-10-CM | POA: Diagnosis not present

## 2015-11-23 DIAGNOSIS — N2581 Secondary hyperparathyroidism of renal origin: Secondary | ICD-10-CM | POA: Diagnosis not present

## 2015-11-23 DIAGNOSIS — Z23 Encounter for immunization: Secondary | ICD-10-CM | POA: Diagnosis not present

## 2015-11-23 DIAGNOSIS — Z992 Dependence on renal dialysis: Secondary | ICD-10-CM | POA: Diagnosis not present

## 2015-11-23 DIAGNOSIS — D509 Iron deficiency anemia, unspecified: Secondary | ICD-10-CM | POA: Diagnosis not present

## 2015-11-23 DIAGNOSIS — N186 End stage renal disease: Secondary | ICD-10-CM | POA: Diagnosis not present

## 2015-11-25 DIAGNOSIS — N186 End stage renal disease: Secondary | ICD-10-CM | POA: Diagnosis not present

## 2015-11-25 DIAGNOSIS — Z23 Encounter for immunization: Secondary | ICD-10-CM | POA: Diagnosis not present

## 2015-11-25 DIAGNOSIS — Z992 Dependence on renal dialysis: Secondary | ICD-10-CM | POA: Diagnosis not present

## 2015-11-25 DIAGNOSIS — D509 Iron deficiency anemia, unspecified: Secondary | ICD-10-CM | POA: Diagnosis not present

## 2015-11-25 DIAGNOSIS — N2581 Secondary hyperparathyroidism of renal origin: Secondary | ICD-10-CM | POA: Diagnosis not present

## 2015-11-27 DIAGNOSIS — N2581 Secondary hyperparathyroidism of renal origin: Secondary | ICD-10-CM | POA: Diagnosis not present

## 2015-11-27 DIAGNOSIS — N186 End stage renal disease: Secondary | ICD-10-CM | POA: Diagnosis not present

## 2015-11-27 DIAGNOSIS — Z23 Encounter for immunization: Secondary | ICD-10-CM | POA: Diagnosis not present

## 2015-11-27 DIAGNOSIS — Z992 Dependence on renal dialysis: Secondary | ICD-10-CM | POA: Diagnosis not present

## 2015-11-27 DIAGNOSIS — D509 Iron deficiency anemia, unspecified: Secondary | ICD-10-CM | POA: Diagnosis not present

## 2015-11-30 DIAGNOSIS — N2581 Secondary hyperparathyroidism of renal origin: Secondary | ICD-10-CM | POA: Diagnosis not present

## 2015-11-30 DIAGNOSIS — Z23 Encounter for immunization: Secondary | ICD-10-CM | POA: Diagnosis not present

## 2015-11-30 DIAGNOSIS — Z992 Dependence on renal dialysis: Secondary | ICD-10-CM | POA: Diagnosis not present

## 2015-11-30 DIAGNOSIS — N186 End stage renal disease: Secondary | ICD-10-CM | POA: Diagnosis not present

## 2015-11-30 DIAGNOSIS — D509 Iron deficiency anemia, unspecified: Secondary | ICD-10-CM | POA: Diagnosis not present

## 2015-12-02 DIAGNOSIS — N186 End stage renal disease: Secondary | ICD-10-CM | POA: Diagnosis not present

## 2015-12-02 DIAGNOSIS — Z23 Encounter for immunization: Secondary | ICD-10-CM | POA: Diagnosis not present

## 2015-12-02 DIAGNOSIS — Z992 Dependence on renal dialysis: Secondary | ICD-10-CM | POA: Diagnosis not present

## 2015-12-02 DIAGNOSIS — N2581 Secondary hyperparathyroidism of renal origin: Secondary | ICD-10-CM | POA: Diagnosis not present

## 2015-12-02 DIAGNOSIS — D509 Iron deficiency anemia, unspecified: Secondary | ICD-10-CM | POA: Diagnosis not present

## 2015-12-03 DIAGNOSIS — I10 Essential (primary) hypertension: Secondary | ICD-10-CM | POA: Diagnosis not present

## 2015-12-03 DIAGNOSIS — E1121 Type 2 diabetes mellitus with diabetic nephropathy: Secondary | ICD-10-CM | POA: Diagnosis not present

## 2015-12-03 DIAGNOSIS — M545 Low back pain: Secondary | ICD-10-CM | POA: Diagnosis not present

## 2015-12-03 DIAGNOSIS — N185 Chronic kidney disease, stage 5: Secondary | ICD-10-CM | POA: Diagnosis not present

## 2015-12-04 DIAGNOSIS — Z23 Encounter for immunization: Secondary | ICD-10-CM | POA: Diagnosis not present

## 2015-12-04 DIAGNOSIS — N186 End stage renal disease: Secondary | ICD-10-CM | POA: Diagnosis not present

## 2015-12-04 DIAGNOSIS — Z992 Dependence on renal dialysis: Secondary | ICD-10-CM | POA: Diagnosis not present

## 2015-12-04 DIAGNOSIS — D509 Iron deficiency anemia, unspecified: Secondary | ICD-10-CM | POA: Diagnosis not present

## 2015-12-04 DIAGNOSIS — N2581 Secondary hyperparathyroidism of renal origin: Secondary | ICD-10-CM | POA: Diagnosis not present

## 2015-12-07 DIAGNOSIS — N2581 Secondary hyperparathyroidism of renal origin: Secondary | ICD-10-CM | POA: Diagnosis not present

## 2015-12-07 DIAGNOSIS — Z23 Encounter for immunization: Secondary | ICD-10-CM | POA: Diagnosis not present

## 2015-12-07 DIAGNOSIS — D509 Iron deficiency anemia, unspecified: Secondary | ICD-10-CM | POA: Diagnosis not present

## 2015-12-07 DIAGNOSIS — Z992 Dependence on renal dialysis: Secondary | ICD-10-CM | POA: Diagnosis not present

## 2015-12-07 DIAGNOSIS — N186 End stage renal disease: Secondary | ICD-10-CM | POA: Diagnosis not present

## 2015-12-08 DIAGNOSIS — L851 Acquired keratosis [keratoderma] palmaris et plantaris: Secondary | ICD-10-CM | POA: Diagnosis not present

## 2015-12-08 DIAGNOSIS — T8522XA Displacement of intraocular lens, initial encounter: Secondary | ICD-10-CM | POA: Diagnosis not present

## 2015-12-08 DIAGNOSIS — H33012 Retinal detachment with single break, left eye: Secondary | ICD-10-CM | POA: Diagnosis not present

## 2015-12-08 DIAGNOSIS — H40042 Steroid responder, left eye: Secondary | ICD-10-CM | POA: Diagnosis not present

## 2015-12-08 DIAGNOSIS — E1142 Type 2 diabetes mellitus with diabetic polyneuropathy: Secondary | ICD-10-CM | POA: Diagnosis not present

## 2015-12-08 DIAGNOSIS — B351 Tinea unguium: Secondary | ICD-10-CM | POA: Diagnosis not present

## 2015-12-08 DIAGNOSIS — H4062X1 Glaucoma secondary to drugs, left eye, mild stage: Secondary | ICD-10-CM | POA: Diagnosis not present

## 2015-12-09 DIAGNOSIS — D509 Iron deficiency anemia, unspecified: Secondary | ICD-10-CM | POA: Diagnosis not present

## 2015-12-09 DIAGNOSIS — N186 End stage renal disease: Secondary | ICD-10-CM | POA: Diagnosis not present

## 2015-12-09 DIAGNOSIS — Z992 Dependence on renal dialysis: Secondary | ICD-10-CM | POA: Diagnosis not present

## 2015-12-09 DIAGNOSIS — Z23 Encounter for immunization: Secondary | ICD-10-CM | POA: Diagnosis not present

## 2015-12-09 DIAGNOSIS — N2581 Secondary hyperparathyroidism of renal origin: Secondary | ICD-10-CM | POA: Diagnosis not present

## 2015-12-11 DIAGNOSIS — N2581 Secondary hyperparathyroidism of renal origin: Secondary | ICD-10-CM | POA: Diagnosis not present

## 2015-12-11 DIAGNOSIS — N186 End stage renal disease: Secondary | ICD-10-CM | POA: Diagnosis not present

## 2015-12-11 DIAGNOSIS — D509 Iron deficiency anemia, unspecified: Secondary | ICD-10-CM | POA: Diagnosis not present

## 2015-12-11 DIAGNOSIS — Z992 Dependence on renal dialysis: Secondary | ICD-10-CM | POA: Diagnosis not present

## 2015-12-11 DIAGNOSIS — Z23 Encounter for immunization: Secondary | ICD-10-CM | POA: Diagnosis not present

## 2015-12-12 DIAGNOSIS — Z992 Dependence on renal dialysis: Secondary | ICD-10-CM | POA: Diagnosis not present

## 2015-12-12 DIAGNOSIS — N186 End stage renal disease: Secondary | ICD-10-CM | POA: Diagnosis not present

## 2015-12-14 DIAGNOSIS — N186 End stage renal disease: Secondary | ICD-10-CM | POA: Diagnosis not present

## 2015-12-14 DIAGNOSIS — N2581 Secondary hyperparathyroidism of renal origin: Secondary | ICD-10-CM | POA: Diagnosis not present

## 2015-12-14 DIAGNOSIS — D509 Iron deficiency anemia, unspecified: Secondary | ICD-10-CM | POA: Diagnosis not present

## 2015-12-14 DIAGNOSIS — Z992 Dependence on renal dialysis: Secondary | ICD-10-CM | POA: Diagnosis not present

## 2015-12-16 DIAGNOSIS — D509 Iron deficiency anemia, unspecified: Secondary | ICD-10-CM | POA: Diagnosis not present

## 2015-12-16 DIAGNOSIS — N186 End stage renal disease: Secondary | ICD-10-CM | POA: Diagnosis not present

## 2015-12-16 DIAGNOSIS — N2581 Secondary hyperparathyroidism of renal origin: Secondary | ICD-10-CM | POA: Diagnosis not present

## 2015-12-16 DIAGNOSIS — Z992 Dependence on renal dialysis: Secondary | ICD-10-CM | POA: Diagnosis not present

## 2015-12-18 DIAGNOSIS — D509 Iron deficiency anemia, unspecified: Secondary | ICD-10-CM | POA: Diagnosis not present

## 2015-12-18 DIAGNOSIS — Z992 Dependence on renal dialysis: Secondary | ICD-10-CM | POA: Diagnosis not present

## 2015-12-18 DIAGNOSIS — N186 End stage renal disease: Secondary | ICD-10-CM | POA: Diagnosis not present

## 2015-12-18 DIAGNOSIS — N2581 Secondary hyperparathyroidism of renal origin: Secondary | ICD-10-CM | POA: Diagnosis not present

## 2015-12-21 DIAGNOSIS — N186 End stage renal disease: Secondary | ICD-10-CM | POA: Diagnosis not present

## 2015-12-21 DIAGNOSIS — N2581 Secondary hyperparathyroidism of renal origin: Secondary | ICD-10-CM | POA: Diagnosis not present

## 2015-12-21 DIAGNOSIS — Z992 Dependence on renal dialysis: Secondary | ICD-10-CM | POA: Diagnosis not present

## 2015-12-21 DIAGNOSIS — D509 Iron deficiency anemia, unspecified: Secondary | ICD-10-CM | POA: Diagnosis not present

## 2015-12-22 DIAGNOSIS — H00022 Hordeolum internum right lower eyelid: Secondary | ICD-10-CM | POA: Diagnosis not present

## 2015-12-23 DIAGNOSIS — D509 Iron deficiency anemia, unspecified: Secondary | ICD-10-CM | POA: Diagnosis not present

## 2015-12-23 DIAGNOSIS — N2581 Secondary hyperparathyroidism of renal origin: Secondary | ICD-10-CM | POA: Diagnosis not present

## 2015-12-23 DIAGNOSIS — Z992 Dependence on renal dialysis: Secondary | ICD-10-CM | POA: Diagnosis not present

## 2015-12-23 DIAGNOSIS — N186 End stage renal disease: Secondary | ICD-10-CM | POA: Diagnosis not present

## 2015-12-25 DIAGNOSIS — N2581 Secondary hyperparathyroidism of renal origin: Secondary | ICD-10-CM | POA: Diagnosis not present

## 2015-12-25 DIAGNOSIS — Z992 Dependence on renal dialysis: Secondary | ICD-10-CM | POA: Diagnosis not present

## 2015-12-25 DIAGNOSIS — N186 End stage renal disease: Secondary | ICD-10-CM | POA: Diagnosis not present

## 2015-12-25 DIAGNOSIS — D509 Iron deficiency anemia, unspecified: Secondary | ICD-10-CM | POA: Diagnosis not present

## 2015-12-28 DIAGNOSIS — N186 End stage renal disease: Secondary | ICD-10-CM | POA: Diagnosis not present

## 2015-12-28 DIAGNOSIS — Z992 Dependence on renal dialysis: Secondary | ICD-10-CM | POA: Diagnosis not present

## 2015-12-28 DIAGNOSIS — N2581 Secondary hyperparathyroidism of renal origin: Secondary | ICD-10-CM | POA: Diagnosis not present

## 2015-12-28 DIAGNOSIS — D509 Iron deficiency anemia, unspecified: Secondary | ICD-10-CM | POA: Diagnosis not present

## 2015-12-29 DIAGNOSIS — H00022 Hordeolum internum right lower eyelid: Secondary | ICD-10-CM | POA: Diagnosis not present

## 2015-12-30 DIAGNOSIS — N186 End stage renal disease: Secondary | ICD-10-CM | POA: Diagnosis not present

## 2015-12-30 DIAGNOSIS — D509 Iron deficiency anemia, unspecified: Secondary | ICD-10-CM | POA: Diagnosis not present

## 2015-12-30 DIAGNOSIS — Z992 Dependence on renal dialysis: Secondary | ICD-10-CM | POA: Diagnosis not present

## 2015-12-30 DIAGNOSIS — N2581 Secondary hyperparathyroidism of renal origin: Secondary | ICD-10-CM | POA: Diagnosis not present

## 2016-01-01 DIAGNOSIS — N2581 Secondary hyperparathyroidism of renal origin: Secondary | ICD-10-CM | POA: Diagnosis not present

## 2016-01-01 DIAGNOSIS — N186 End stage renal disease: Secondary | ICD-10-CM | POA: Diagnosis not present

## 2016-01-01 DIAGNOSIS — Z992 Dependence on renal dialysis: Secondary | ICD-10-CM | POA: Diagnosis not present

## 2016-01-01 DIAGNOSIS — D509 Iron deficiency anemia, unspecified: Secondary | ICD-10-CM | POA: Diagnosis not present

## 2016-01-04 DIAGNOSIS — D509 Iron deficiency anemia, unspecified: Secondary | ICD-10-CM | POA: Diagnosis not present

## 2016-01-04 DIAGNOSIS — N186 End stage renal disease: Secondary | ICD-10-CM | POA: Diagnosis not present

## 2016-01-04 DIAGNOSIS — N2581 Secondary hyperparathyroidism of renal origin: Secondary | ICD-10-CM | POA: Diagnosis not present

## 2016-01-04 DIAGNOSIS — Z992 Dependence on renal dialysis: Secondary | ICD-10-CM | POA: Diagnosis not present

## 2016-01-06 DIAGNOSIS — D509 Iron deficiency anemia, unspecified: Secondary | ICD-10-CM | POA: Diagnosis not present

## 2016-01-06 DIAGNOSIS — Z992 Dependence on renal dialysis: Secondary | ICD-10-CM | POA: Diagnosis not present

## 2016-01-06 DIAGNOSIS — N2581 Secondary hyperparathyroidism of renal origin: Secondary | ICD-10-CM | POA: Diagnosis not present

## 2016-01-06 DIAGNOSIS — N186 End stage renal disease: Secondary | ICD-10-CM | POA: Diagnosis not present

## 2016-01-08 DIAGNOSIS — D509 Iron deficiency anemia, unspecified: Secondary | ICD-10-CM | POA: Diagnosis not present

## 2016-01-08 DIAGNOSIS — N2581 Secondary hyperparathyroidism of renal origin: Secondary | ICD-10-CM | POA: Diagnosis not present

## 2016-01-08 DIAGNOSIS — Z992 Dependence on renal dialysis: Secondary | ICD-10-CM | POA: Diagnosis not present

## 2016-01-08 DIAGNOSIS — N186 End stage renal disease: Secondary | ICD-10-CM | POA: Diagnosis not present

## 2016-01-11 DIAGNOSIS — N186 End stage renal disease: Secondary | ICD-10-CM | POA: Diagnosis not present

## 2016-01-11 DIAGNOSIS — Z992 Dependence on renal dialysis: Secondary | ICD-10-CM | POA: Diagnosis not present

## 2016-01-11 DIAGNOSIS — D509 Iron deficiency anemia, unspecified: Secondary | ICD-10-CM | POA: Diagnosis not present

## 2016-01-11 DIAGNOSIS — N2581 Secondary hyperparathyroidism of renal origin: Secondary | ICD-10-CM | POA: Diagnosis not present

## 2016-01-12 DIAGNOSIS — Z992 Dependence on renal dialysis: Secondary | ICD-10-CM | POA: Diagnosis not present

## 2016-01-12 DIAGNOSIS — N186 End stage renal disease: Secondary | ICD-10-CM | POA: Diagnosis not present

## 2016-01-13 DIAGNOSIS — N186 End stage renal disease: Secondary | ICD-10-CM | POA: Diagnosis not present

## 2016-01-13 DIAGNOSIS — Z992 Dependence on renal dialysis: Secondary | ICD-10-CM | POA: Diagnosis not present

## 2016-01-13 DIAGNOSIS — D509 Iron deficiency anemia, unspecified: Secondary | ICD-10-CM | POA: Diagnosis not present

## 2016-01-13 DIAGNOSIS — N2581 Secondary hyperparathyroidism of renal origin: Secondary | ICD-10-CM | POA: Diagnosis not present

## 2016-01-15 DIAGNOSIS — N2581 Secondary hyperparathyroidism of renal origin: Secondary | ICD-10-CM | POA: Diagnosis not present

## 2016-01-15 DIAGNOSIS — Z992 Dependence on renal dialysis: Secondary | ICD-10-CM | POA: Diagnosis not present

## 2016-01-15 DIAGNOSIS — N186 End stage renal disease: Secondary | ICD-10-CM | POA: Diagnosis not present

## 2016-01-15 DIAGNOSIS — D509 Iron deficiency anemia, unspecified: Secondary | ICD-10-CM | POA: Diagnosis not present

## 2016-01-18 DIAGNOSIS — D509 Iron deficiency anemia, unspecified: Secondary | ICD-10-CM | POA: Diagnosis not present

## 2016-01-18 DIAGNOSIS — N2581 Secondary hyperparathyroidism of renal origin: Secondary | ICD-10-CM | POA: Diagnosis not present

## 2016-01-18 DIAGNOSIS — Z992 Dependence on renal dialysis: Secondary | ICD-10-CM | POA: Diagnosis not present

## 2016-01-18 DIAGNOSIS — N186 End stage renal disease: Secondary | ICD-10-CM | POA: Diagnosis not present

## 2016-01-20 DIAGNOSIS — Z992 Dependence on renal dialysis: Secondary | ICD-10-CM | POA: Diagnosis not present

## 2016-01-20 DIAGNOSIS — N186 End stage renal disease: Secondary | ICD-10-CM | POA: Diagnosis not present

## 2016-01-20 DIAGNOSIS — N2581 Secondary hyperparathyroidism of renal origin: Secondary | ICD-10-CM | POA: Diagnosis not present

## 2016-01-20 DIAGNOSIS — D509 Iron deficiency anemia, unspecified: Secondary | ICD-10-CM | POA: Diagnosis not present

## 2016-01-22 DIAGNOSIS — N2581 Secondary hyperparathyroidism of renal origin: Secondary | ICD-10-CM | POA: Diagnosis not present

## 2016-01-22 DIAGNOSIS — Z992 Dependence on renal dialysis: Secondary | ICD-10-CM | POA: Diagnosis not present

## 2016-01-22 DIAGNOSIS — N186 End stage renal disease: Secondary | ICD-10-CM | POA: Diagnosis not present

## 2016-01-22 DIAGNOSIS — D509 Iron deficiency anemia, unspecified: Secondary | ICD-10-CM | POA: Diagnosis not present

## 2016-01-25 DIAGNOSIS — N2581 Secondary hyperparathyroidism of renal origin: Secondary | ICD-10-CM | POA: Diagnosis not present

## 2016-01-25 DIAGNOSIS — N186 End stage renal disease: Secondary | ICD-10-CM | POA: Diagnosis not present

## 2016-01-25 DIAGNOSIS — Z992 Dependence on renal dialysis: Secondary | ICD-10-CM | POA: Diagnosis not present

## 2016-01-25 DIAGNOSIS — D509 Iron deficiency anemia, unspecified: Secondary | ICD-10-CM | POA: Diagnosis not present

## 2016-01-27 DIAGNOSIS — Z992 Dependence on renal dialysis: Secondary | ICD-10-CM | POA: Diagnosis not present

## 2016-01-27 DIAGNOSIS — N186 End stage renal disease: Secondary | ICD-10-CM | POA: Diagnosis not present

## 2016-01-27 DIAGNOSIS — D509 Iron deficiency anemia, unspecified: Secondary | ICD-10-CM | POA: Diagnosis not present

## 2016-01-27 DIAGNOSIS — N2581 Secondary hyperparathyroidism of renal origin: Secondary | ICD-10-CM | POA: Diagnosis not present

## 2016-01-27 DIAGNOSIS — Z1159 Encounter for screening for other viral diseases: Secondary | ICD-10-CM | POA: Diagnosis not present

## 2016-01-29 DIAGNOSIS — Z992 Dependence on renal dialysis: Secondary | ICD-10-CM | POA: Diagnosis not present

## 2016-01-29 DIAGNOSIS — N186 End stage renal disease: Secondary | ICD-10-CM | POA: Diagnosis not present

## 2016-01-29 DIAGNOSIS — N2581 Secondary hyperparathyroidism of renal origin: Secondary | ICD-10-CM | POA: Diagnosis not present

## 2016-01-29 DIAGNOSIS — D509 Iron deficiency anemia, unspecified: Secondary | ICD-10-CM | POA: Diagnosis not present

## 2016-02-01 DIAGNOSIS — N2581 Secondary hyperparathyroidism of renal origin: Secondary | ICD-10-CM | POA: Diagnosis not present

## 2016-02-01 DIAGNOSIS — Z992 Dependence on renal dialysis: Secondary | ICD-10-CM | POA: Diagnosis not present

## 2016-02-01 DIAGNOSIS — D509 Iron deficiency anemia, unspecified: Secondary | ICD-10-CM | POA: Diagnosis not present

## 2016-02-01 DIAGNOSIS — N186 End stage renal disease: Secondary | ICD-10-CM | POA: Diagnosis not present

## 2016-02-02 ENCOUNTER — Ambulatory Visit (INDEPENDENT_AMBULATORY_CARE_PROVIDER_SITE_OTHER): Payer: Medicare Other | Admitting: Internal Medicine

## 2016-02-02 ENCOUNTER — Encounter (INDEPENDENT_AMBULATORY_CARE_PROVIDER_SITE_OTHER): Payer: Self-pay

## 2016-02-02 ENCOUNTER — Other Ambulatory Visit (INDEPENDENT_AMBULATORY_CARE_PROVIDER_SITE_OTHER): Payer: Self-pay | Admitting: *Deleted

## 2016-02-02 ENCOUNTER — Encounter (INDEPENDENT_AMBULATORY_CARE_PROVIDER_SITE_OTHER): Payer: Self-pay | Admitting: Internal Medicine

## 2016-02-02 VITALS — BP 108/86 | HR 64 | Temp 98.2°F | Ht 72.0 in | Wt 210.0 lb

## 2016-02-02 DIAGNOSIS — K2100 Gastro-esophageal reflux disease with esophagitis, without bleeding: Secondary | ICD-10-CM

## 2016-02-02 DIAGNOSIS — Z862 Personal history of diseases of the blood and blood-forming organs and certain disorders involving the immune mechanism: Secondary | ICD-10-CM

## 2016-02-02 DIAGNOSIS — H401131 Primary open-angle glaucoma, bilateral, mild stage: Secondary | ICD-10-CM | POA: Diagnosis not present

## 2016-02-02 NOTE — Patient Instructions (Signed)
Starting next year can try pantoprazole every other day. Will request copy of recent blood work from Dr. Rhona Leavens office

## 2016-02-02 NOTE — Progress Notes (Signed)
. Presenting complaint;  Follow-up for GERD. History of iron deficiency anemia.  Database and Subjective:  Patient is 82 year old Caucasian male who is here for scheduled visit. She was last seen in October 2016 for iron deficiency anemia and heme positive stool. He has history of colon carcinoma with right hemicolectomy in 1998. Also has history of GERD complicated by esophageal stricture which was last dilated 3-1/2 years ago. Following his last visit in October 2016 he underwent colonoscopy in November 2016 with removal of 3 polyps and 2 were tubular adenomas. He was maintained on by mouth iron. Since he has been on hemodialysis by mouth iron has been discontinued.  Patient is here for scheduled visit accompanied by his wife. He states heartburn is well controlled with PPI. He has good appetite. He has gained 7 pounds is his last visit. He is not having any side effects with until resolved. He also denies dysphagia. He denies abdominal pain melena or rectal bleeding. Bowels move daily. He has chronic back pain. He uses cane to ambulate. He states he is able to do some yard work. He is not able to walk long distance. He states he is been getting blood work regularly at dialysis clinic and he believes his H&H has been normal.    Current Medications: Outpatient Encounter Prescriptions as of 02/02/2016  Medication Sig  . acetaminophen (TYLENOL) 500 MG tablet Take 1,000 mg by mouth every 6 (six) hours as needed for mild pain, fever or headache.  . ALPRAZolam (XANAX) 0.5 MG tablet Take 0.5 mg by mouth at bedtime as needed for sleep.  . cinacalcet (SENSIPAR) 30 MG tablet Take 30 mg by mouth daily.  Marland Kitchen glipiZIDE (GLUCOTROL XL) 10 MG 24 hr tablet Take 10 mg by mouth every morning.  . midodrine (PROAMATINE) 5 MG tablet   . multivitamin (RENA-VIT) TABS tablet Take 1 tablet by mouth daily.  Marland Kitchen ofloxacin (OCUFLOX) 0.3 % ophthalmic solution INT 1 GTT IN OD QID FOR 2 WKS  . pantoprazole (PROTONIX) 40  MG tablet TAKE 1 TABLET BY MOUTH EVERY DAY  . rOPINIRole (REQUIP) 0.5 MG tablet Take 0.5 mg by mouth at bedtime as needed (restless leg).   . sevelamer carbonate (RENVELA) 800 MG tablet Take 800 mg by mouth 3 (three) times daily with meals.  . sodium bicarbonate 650 MG tablet Take 650 mg by mouth 2 (two) times daily.  . TRAVATAN Z 0.004 % SOLN ophthalmic solution PLACE 1 DROP INTO LEFT EYE DAILY  . [DISCONTINUED] calcitRIOL (ROCALTROL) 0.5 MCG capsule Take 0.5 mcg by mouth daily.  . [DISCONTINUED] ciprofloxacin (CIPRO) 500 MG tablet   . [DISCONTINUED] ofloxacin (OCUFLOX) 0.3 % ophthalmic solution One drop in OD QID for 2 weeks   No facility-administered encounter medications on file as of 02/02/2016.      Objective: Blood pressure 108/86, pulse 64, temperature 98.2 F (36.8 C), height 6' (1.829 m), weight 210 lb (95.3 kg). Patient is alert and in no acute distress. Conjunctiva is pink. Sclera is nonicteric Oropharyngeal mucosa is normal. No neck masses or thyromegaly noted. Cardiac exam with regular rhythm normal S1 and S2. Grade 2/6 systolic ejection murmur best heard at LLSB. Lungs are clear to auscultation. Abdomen is full but soft and nontender without organomegaly or masses. No LE edema or clubbing noted.  Labs/studies Results: No recent labs in epic.    Assessment:  #1. Chronic GERD complicated by stricture at GE junction which was dilated back in May 2014. Heartburn is well controlled with therapy  and he is not having any dysphagia. #2. History of iron deficiency anemia possibly due to impaired iron absorption due to chronic PPI therapy. By mouth iron was discontinued by  Dr. Hinda Lenis recently. #3. History of colon carcinoma. Status post right hemicolectomy in 1998 and last surveillance colonoscopy was 1 year ago with removal of two polyps which were tubular adenomas. Next colonoscopy is not you until 2021.   Plan:  Request copy of recent bloodwork for review. Continue  pantoprazole at 40 mg by mouth every morning. Office visit in one year.

## 2016-02-03 DIAGNOSIS — Z992 Dependence on renal dialysis: Secondary | ICD-10-CM | POA: Diagnosis not present

## 2016-02-03 DIAGNOSIS — N186 End stage renal disease: Secondary | ICD-10-CM | POA: Diagnosis not present

## 2016-02-03 DIAGNOSIS — N2581 Secondary hyperparathyroidism of renal origin: Secondary | ICD-10-CM | POA: Diagnosis not present

## 2016-02-03 DIAGNOSIS — D509 Iron deficiency anemia, unspecified: Secondary | ICD-10-CM | POA: Diagnosis not present

## 2016-02-05 DIAGNOSIS — Z992 Dependence on renal dialysis: Secondary | ICD-10-CM | POA: Diagnosis not present

## 2016-02-05 DIAGNOSIS — N186 End stage renal disease: Secondary | ICD-10-CM | POA: Diagnosis not present

## 2016-02-05 DIAGNOSIS — N2581 Secondary hyperparathyroidism of renal origin: Secondary | ICD-10-CM | POA: Diagnosis not present

## 2016-02-05 DIAGNOSIS — D509 Iron deficiency anemia, unspecified: Secondary | ICD-10-CM | POA: Diagnosis not present

## 2016-02-08 DIAGNOSIS — Z992 Dependence on renal dialysis: Secondary | ICD-10-CM | POA: Diagnosis not present

## 2016-02-08 DIAGNOSIS — D509 Iron deficiency anemia, unspecified: Secondary | ICD-10-CM | POA: Diagnosis not present

## 2016-02-08 DIAGNOSIS — N186 End stage renal disease: Secondary | ICD-10-CM | POA: Diagnosis not present

## 2016-02-08 DIAGNOSIS — N2581 Secondary hyperparathyroidism of renal origin: Secondary | ICD-10-CM | POA: Diagnosis not present

## 2016-02-10 DIAGNOSIS — N2581 Secondary hyperparathyroidism of renal origin: Secondary | ICD-10-CM | POA: Diagnosis not present

## 2016-02-10 DIAGNOSIS — D509 Iron deficiency anemia, unspecified: Secondary | ICD-10-CM | POA: Diagnosis not present

## 2016-02-10 DIAGNOSIS — Z992 Dependence on renal dialysis: Secondary | ICD-10-CM | POA: Diagnosis not present

## 2016-02-10 DIAGNOSIS — N186 End stage renal disease: Secondary | ICD-10-CM | POA: Diagnosis not present

## 2016-02-11 DIAGNOSIS — Z992 Dependence on renal dialysis: Secondary | ICD-10-CM | POA: Diagnosis not present

## 2016-02-11 DIAGNOSIS — N186 End stage renal disease: Secondary | ICD-10-CM | POA: Diagnosis not present

## 2016-02-12 DIAGNOSIS — Z992 Dependence on renal dialysis: Secondary | ICD-10-CM | POA: Diagnosis not present

## 2016-02-12 DIAGNOSIS — N186 End stage renal disease: Secondary | ICD-10-CM | POA: Diagnosis not present

## 2016-02-12 DIAGNOSIS — N2581 Secondary hyperparathyroidism of renal origin: Secondary | ICD-10-CM | POA: Diagnosis not present

## 2016-02-12 DIAGNOSIS — D509 Iron deficiency anemia, unspecified: Secondary | ICD-10-CM | POA: Diagnosis not present

## 2016-02-15 DIAGNOSIS — N2581 Secondary hyperparathyroidism of renal origin: Secondary | ICD-10-CM | POA: Diagnosis not present

## 2016-02-15 DIAGNOSIS — D509 Iron deficiency anemia, unspecified: Secondary | ICD-10-CM | POA: Diagnosis not present

## 2016-02-15 DIAGNOSIS — N186 End stage renal disease: Secondary | ICD-10-CM | POA: Diagnosis not present

## 2016-02-15 DIAGNOSIS — Z992 Dependence on renal dialysis: Secondary | ICD-10-CM | POA: Diagnosis not present

## 2016-02-16 DIAGNOSIS — E1142 Type 2 diabetes mellitus with diabetic polyneuropathy: Secondary | ICD-10-CM | POA: Diagnosis not present

## 2016-02-16 DIAGNOSIS — L851 Acquired keratosis [keratoderma] palmaris et plantaris: Secondary | ICD-10-CM | POA: Diagnosis not present

## 2016-02-16 DIAGNOSIS — B351 Tinea unguium: Secondary | ICD-10-CM | POA: Diagnosis not present

## 2016-02-17 DIAGNOSIS — Z992 Dependence on renal dialysis: Secondary | ICD-10-CM | POA: Diagnosis not present

## 2016-02-17 DIAGNOSIS — N186 End stage renal disease: Secondary | ICD-10-CM | POA: Diagnosis not present

## 2016-02-17 DIAGNOSIS — N2581 Secondary hyperparathyroidism of renal origin: Secondary | ICD-10-CM | POA: Diagnosis not present

## 2016-02-17 DIAGNOSIS — D509 Iron deficiency anemia, unspecified: Secondary | ICD-10-CM | POA: Diagnosis not present

## 2016-02-19 DIAGNOSIS — N186 End stage renal disease: Secondary | ICD-10-CM | POA: Diagnosis not present

## 2016-02-19 DIAGNOSIS — Z992 Dependence on renal dialysis: Secondary | ICD-10-CM | POA: Diagnosis not present

## 2016-02-19 DIAGNOSIS — D509 Iron deficiency anemia, unspecified: Secondary | ICD-10-CM | POA: Diagnosis not present

## 2016-02-19 DIAGNOSIS — N2581 Secondary hyperparathyroidism of renal origin: Secondary | ICD-10-CM | POA: Diagnosis not present

## 2016-02-22 DIAGNOSIS — D509 Iron deficiency anemia, unspecified: Secondary | ICD-10-CM | POA: Diagnosis not present

## 2016-02-22 DIAGNOSIS — N2581 Secondary hyperparathyroidism of renal origin: Secondary | ICD-10-CM | POA: Diagnosis not present

## 2016-02-22 DIAGNOSIS — Z992 Dependence on renal dialysis: Secondary | ICD-10-CM | POA: Diagnosis not present

## 2016-02-22 DIAGNOSIS — N186 End stage renal disease: Secondary | ICD-10-CM | POA: Diagnosis not present

## 2016-02-24 DIAGNOSIS — D509 Iron deficiency anemia, unspecified: Secondary | ICD-10-CM | POA: Diagnosis not present

## 2016-02-24 DIAGNOSIS — Z992 Dependence on renal dialysis: Secondary | ICD-10-CM | POA: Diagnosis not present

## 2016-02-24 DIAGNOSIS — N186 End stage renal disease: Secondary | ICD-10-CM | POA: Diagnosis not present

## 2016-02-24 DIAGNOSIS — N2581 Secondary hyperparathyroidism of renal origin: Secondary | ICD-10-CM | POA: Diagnosis not present

## 2016-02-26 DIAGNOSIS — N186 End stage renal disease: Secondary | ICD-10-CM | POA: Diagnosis not present

## 2016-02-26 DIAGNOSIS — D509 Iron deficiency anemia, unspecified: Secondary | ICD-10-CM | POA: Diagnosis not present

## 2016-02-26 DIAGNOSIS — Z992 Dependence on renal dialysis: Secondary | ICD-10-CM | POA: Diagnosis not present

## 2016-02-26 DIAGNOSIS — N2581 Secondary hyperparathyroidism of renal origin: Secondary | ICD-10-CM | POA: Diagnosis not present

## 2016-02-29 DIAGNOSIS — D509 Iron deficiency anemia, unspecified: Secondary | ICD-10-CM | POA: Diagnosis not present

## 2016-02-29 DIAGNOSIS — N2581 Secondary hyperparathyroidism of renal origin: Secondary | ICD-10-CM | POA: Diagnosis not present

## 2016-02-29 DIAGNOSIS — N186 End stage renal disease: Secondary | ICD-10-CM | POA: Diagnosis not present

## 2016-02-29 DIAGNOSIS — Z992 Dependence on renal dialysis: Secondary | ICD-10-CM | POA: Diagnosis not present

## 2016-03-02 DIAGNOSIS — D509 Iron deficiency anemia, unspecified: Secondary | ICD-10-CM | POA: Diagnosis not present

## 2016-03-02 DIAGNOSIS — N2581 Secondary hyperparathyroidism of renal origin: Secondary | ICD-10-CM | POA: Diagnosis not present

## 2016-03-02 DIAGNOSIS — N186 End stage renal disease: Secondary | ICD-10-CM | POA: Diagnosis not present

## 2016-03-02 DIAGNOSIS — Z992 Dependence on renal dialysis: Secondary | ICD-10-CM | POA: Diagnosis not present

## 2016-03-04 DIAGNOSIS — N2581 Secondary hyperparathyroidism of renal origin: Secondary | ICD-10-CM | POA: Diagnosis not present

## 2016-03-04 DIAGNOSIS — N186 End stage renal disease: Secondary | ICD-10-CM | POA: Diagnosis not present

## 2016-03-04 DIAGNOSIS — Z992 Dependence on renal dialysis: Secondary | ICD-10-CM | POA: Diagnosis not present

## 2016-03-04 DIAGNOSIS — D509 Iron deficiency anemia, unspecified: Secondary | ICD-10-CM | POA: Diagnosis not present

## 2016-03-08 DIAGNOSIS — N186 End stage renal disease: Secondary | ICD-10-CM | POA: Diagnosis not present

## 2016-03-08 DIAGNOSIS — N2581 Secondary hyperparathyroidism of renal origin: Secondary | ICD-10-CM | POA: Diagnosis not present

## 2016-03-08 DIAGNOSIS — D509 Iron deficiency anemia, unspecified: Secondary | ICD-10-CM | POA: Diagnosis not present

## 2016-03-08 DIAGNOSIS — Z992 Dependence on renal dialysis: Secondary | ICD-10-CM | POA: Diagnosis not present

## 2016-03-11 DIAGNOSIS — N2581 Secondary hyperparathyroidism of renal origin: Secondary | ICD-10-CM | POA: Diagnosis not present

## 2016-03-11 DIAGNOSIS — N186 End stage renal disease: Secondary | ICD-10-CM | POA: Diagnosis not present

## 2016-03-11 DIAGNOSIS — D509 Iron deficiency anemia, unspecified: Secondary | ICD-10-CM | POA: Diagnosis not present

## 2016-03-11 DIAGNOSIS — Z992 Dependence on renal dialysis: Secondary | ICD-10-CM | POA: Diagnosis not present

## 2016-03-13 DIAGNOSIS — Z992 Dependence on renal dialysis: Secondary | ICD-10-CM | POA: Diagnosis not present

## 2016-03-13 DIAGNOSIS — N186 End stage renal disease: Secondary | ICD-10-CM | POA: Diagnosis not present

## 2016-03-14 DIAGNOSIS — Z23 Encounter for immunization: Secondary | ICD-10-CM | POA: Diagnosis not present

## 2016-03-14 DIAGNOSIS — N2581 Secondary hyperparathyroidism of renal origin: Secondary | ICD-10-CM | POA: Diagnosis not present

## 2016-03-14 DIAGNOSIS — N186 End stage renal disease: Secondary | ICD-10-CM | POA: Diagnosis not present

## 2016-03-14 DIAGNOSIS — D509 Iron deficiency anemia, unspecified: Secondary | ICD-10-CM | POA: Diagnosis not present

## 2016-03-14 DIAGNOSIS — Z992 Dependence on renal dialysis: Secondary | ICD-10-CM | POA: Diagnosis not present

## 2016-03-16 DIAGNOSIS — D509 Iron deficiency anemia, unspecified: Secondary | ICD-10-CM | POA: Diagnosis not present

## 2016-03-16 DIAGNOSIS — Z23 Encounter for immunization: Secondary | ICD-10-CM | POA: Diagnosis not present

## 2016-03-16 DIAGNOSIS — N186 End stage renal disease: Secondary | ICD-10-CM | POA: Diagnosis not present

## 2016-03-16 DIAGNOSIS — Z992 Dependence on renal dialysis: Secondary | ICD-10-CM | POA: Diagnosis not present

## 2016-03-16 DIAGNOSIS — N2581 Secondary hyperparathyroidism of renal origin: Secondary | ICD-10-CM | POA: Diagnosis not present

## 2016-03-18 DIAGNOSIS — D509 Iron deficiency anemia, unspecified: Secondary | ICD-10-CM | POA: Diagnosis not present

## 2016-03-18 DIAGNOSIS — Z992 Dependence on renal dialysis: Secondary | ICD-10-CM | POA: Diagnosis not present

## 2016-03-18 DIAGNOSIS — Z23 Encounter for immunization: Secondary | ICD-10-CM | POA: Diagnosis not present

## 2016-03-18 DIAGNOSIS — N2581 Secondary hyperparathyroidism of renal origin: Secondary | ICD-10-CM | POA: Diagnosis not present

## 2016-03-18 DIAGNOSIS — N186 End stage renal disease: Secondary | ICD-10-CM | POA: Diagnosis not present

## 2016-03-21 DIAGNOSIS — Z992 Dependence on renal dialysis: Secondary | ICD-10-CM | POA: Diagnosis not present

## 2016-03-21 DIAGNOSIS — D509 Iron deficiency anemia, unspecified: Secondary | ICD-10-CM | POA: Diagnosis not present

## 2016-03-21 DIAGNOSIS — Z23 Encounter for immunization: Secondary | ICD-10-CM | POA: Diagnosis not present

## 2016-03-21 DIAGNOSIS — N186 End stage renal disease: Secondary | ICD-10-CM | POA: Diagnosis not present

## 2016-03-21 DIAGNOSIS — N2581 Secondary hyperparathyroidism of renal origin: Secondary | ICD-10-CM | POA: Diagnosis not present

## 2016-03-22 DIAGNOSIS — Z992 Dependence on renal dialysis: Secondary | ICD-10-CM | POA: Diagnosis not present

## 2016-03-22 DIAGNOSIS — N2581 Secondary hyperparathyroidism of renal origin: Secondary | ICD-10-CM | POA: Diagnosis not present

## 2016-03-22 DIAGNOSIS — N186 End stage renal disease: Secondary | ICD-10-CM | POA: Diagnosis not present

## 2016-03-22 DIAGNOSIS — Z23 Encounter for immunization: Secondary | ICD-10-CM | POA: Diagnosis not present

## 2016-03-22 DIAGNOSIS — D509 Iron deficiency anemia, unspecified: Secondary | ICD-10-CM | POA: Diagnosis not present

## 2016-03-23 ENCOUNTER — Encounter (HOSPITAL_COMMUNITY): Payer: Self-pay

## 2016-03-23 ENCOUNTER — Emergency Department (HOSPITAL_COMMUNITY)
Admission: EM | Admit: 2016-03-23 | Discharge: 2016-03-23 | Disposition: A | Discharge status: Home / Self Care | Payer: Medicare Other | Attending: Emergency Medicine | Admitting: Emergency Medicine

## 2016-03-23 DIAGNOSIS — L259 Unspecified contact dermatitis, unspecified cause: Secondary | ICD-10-CM | POA: Insufficient documentation

## 2016-03-23 DIAGNOSIS — E1122 Type 2 diabetes mellitus with diabetic chronic kidney disease: Secondary | ICD-10-CM | POA: Diagnosis not present

## 2016-03-23 DIAGNOSIS — N2581 Secondary hyperparathyroidism of renal origin: Secondary | ICD-10-CM | POA: Diagnosis not present

## 2016-03-23 DIAGNOSIS — R21 Rash and other nonspecific skin eruption: Secondary | ICD-10-CM | POA: Diagnosis not present

## 2016-03-23 DIAGNOSIS — Z23 Encounter for immunization: Secondary | ICD-10-CM | POA: Diagnosis not present

## 2016-03-23 DIAGNOSIS — Z87891 Personal history of nicotine dependence: Secondary | ICD-10-CM | POA: Diagnosis not present

## 2016-03-23 DIAGNOSIS — I120 Hypertensive chronic kidney disease with stage 5 chronic kidney disease or end stage renal disease: Secondary | ICD-10-CM | POA: Insufficient documentation

## 2016-03-23 DIAGNOSIS — Z7984 Long term (current) use of oral hypoglycemic drugs: Secondary | ICD-10-CM | POA: Diagnosis not present

## 2016-03-23 DIAGNOSIS — Z992 Dependence on renal dialysis: Secondary | ICD-10-CM | POA: Diagnosis not present

## 2016-03-23 DIAGNOSIS — N186 End stage renal disease: Secondary | ICD-10-CM | POA: Insufficient documentation

## 2016-03-23 DIAGNOSIS — D509 Iron deficiency anemia, unspecified: Secondary | ICD-10-CM | POA: Diagnosis not present

## 2016-03-23 NOTE — ED Triage Notes (Signed)
He also has a place on his right arm that came up and they told him that he probably hit it or was on blood thinners.  There has not been an injury and he is not on blood thinners per spouse.

## 2016-03-23 NOTE — Discharge Instructions (Signed)
Try 1% hydrocortisone cream Taken Benadryl 25mg  every 6 hours as needed for itch

## 2016-03-23 NOTE — ED Triage Notes (Signed)
I am broken out in a rash.  It started a couple of days ago and it has been itching since midnight.  Have not changed any soaps or washing detergents at home.  Patient denies any trouble swallowing or shortness of breath.

## 2016-03-23 NOTE — ED Provider Notes (Signed)
[facility] DEPT Provider Note   CSN: [id number] Arrival date & time: 03/23/16  0301     History   Chief Complaint Chief Complaint  Patient presents with  . Rash    HPI Gregory Rangel is a 83 y.o. male.  HPI Patient presents to the emergency department with an itchy rash across his chest abdomen and back that been present over the past several days.  He states the the itching became worse tonight and thus he came to the ER for evaluation.  He put alcohol on the rash prior to evaluation reports no more itching at this time.  His wife does not have any current rash or itching.  No new detergents or soaps or clothing per the patient.  He states the itches throughout his chest abdomen and back.  He's had no fevers or chills.  He is scheduled to see the dermatologist next week but his itching became worse tonight and thus he came to the ER for evaluation.  No other complaints.   Past Medical History:  Diagnosis Date  . Anemia   . Arthritis   . Cancer [facility])    colon cancer and prostate  . Chronic kidney disease   . Diabetes mellitus without complication ([place])   . DVT (deep venous thrombosis) ([place])   . ESRD on dialysis [facility])    Monday, Wednesday and Friday  . GERD (gastroesophageal reflux disease)   . Hypertension   . Kidney stone   . Shortness of breath    with exertion  . Varicose veins     Patient Active Problem List   Diagnosis Date Noted  . Malfunction of arteriovenous graft ([place]) 10/16/2014  . Varicose veins of lower extremities with other complications [id number]  . Swelling of limb 10/26/2012  . Chronic venous insufficiency 10/26/2012  . Cellulitis 10/18/2012  . Postoperative stiffness of total knee replacement ([place]) 10/09/2012  . Dysphagia, unspecified(787.20) 07/31/2012  . Right knee DJD 07/06/2012  . Lumbago 05/09/2011  . Lumbar spondylosis 05/09/2011    Past Surgical History:  Procedure Laterality Date  . APPENDECTOMY    . BACK SURGERY     x 2  .  BASCILIC VEIN TRANSPOSITION Left 06/10/2014   Procedure: LEFT FIRST STAGE BASILIC VEIN TRANSPOSITION ;  Surgeon: Angelia Mould, MD;  Location: [place];  Service: Vascular;  Laterality: Left;  . CATARACT EXTRACTION W/PHACO Left 11/05/2013   Procedure: CATARACT EXTRACTION PHACO AND INTRAOCULAR LENS PLACEMENT ([facility]);  Surgeon: Elta Guadeloupe T. Gershon Crane, MD;  Location: [facility];  Service: Ophthalmology;  Laterality: Left;  CDE: 22.81  . CHOLECYSTECTOMY    . COLON SURGERY    . COLONOSCOPY    . COLONOSCOPY N/A 01/15/2015   Procedure: COLONOSCOPY;  Surgeon: Rogene Houston, MD;  [facility];  Service: Endoscopy;  Laterality: N/A;  1025  . ENDOVENOUS ABLATION SAPHENOUS VEIN W/ LASER Right 04-25-2013   right greater saphenous vein Curt Jews MD  . ESOPHAGOGASTRODUODENOSCOPY (EGD) WITH ESOPHAGEAL DILATION N/A 08/09/2012   Procedure: ESOPHAGOGASTRODUODENOSCOPY (EGD) WITH ESOPHAGEAL DILATION;  Surgeon: Rogene Houston, MD;  [facility];  Service: Endoscopy;  Laterality: N/A;  155-moved to 830 Ann to notify pt  . EYE SURGERY     right eye with lens implant  . INSERTION OF DIALYSIS CATHETER Right 10/17/2014   Procedure: INSERTION OF DIALYSIS CATHETER;  Surgeon: Rosetta Posner, MD;  Location: [place];  Service: Vascular;  Laterality: Right;  . PERIPHERAL VASCULAR CATHETERIZATION Left 10/23/2014   Procedure:  Fistulagram;  Surgeon: Conrad [place], MD;  Location: [place] CV LAB;  Service: Cardiovascular;  Laterality: Left;  . PROSTATE SURGERY    . right knee arthroscopy    . TONSILLECTOMY    . TOTAL KNEE ARTHROPLASTY Right 07/06/2012   Procedure: RIGHT TOTAL KNEE ARTHROPLASTY;  Surgeon: Johnn Hai, MD;  [facility];  Service: Orthopedics;  Laterality: Right;  . ULTRASOUND GUIDANCE FOR VASCULAR ACCESS Left 10/23/2014   Procedure: Ultrasound Guidance For Vascular Access;  Surgeon: Conrad [place], MD;  Location: [place] CV LAB;  Service: Cardiovascular;  Laterality: Left;       Home  Medications    Prior to Admission medications   Medication Sig Start Date End Date Taking? Authorizing Provider  acetaminophen (TYLENOL) 500 MG tablet Take 1,000 mg by mouth every 6 (six) hours as needed for mild pain, fever or headache.    Historical Provider, MD  ALPRAZolam Duanne Moron) 0.5 MG tablet Take 0.5 mg by mouth at bedtime as needed for sleep.    Historical Provider, MD  cinacalcet (SENSIPAR) 30 MG tablet Take 60 mg by mouth daily.     Historical Provider, MD  glipiZIDE (GLUCOTROL XL) 10 MG 24 hr tablet Take 10 mg by mouth every morning.    Historical Provider, MD  midodrine (PROAMATINE) 5 MG tablet 5 mg every Monday, Wednesday, and Friday.  12/03/15   Historical Provider, MD  multivitamin (RENA-VIT) TABS tablet Take 1 tablet by mouth daily.    Historical Provider, MD  ofloxacin (OCUFLOX) 0.3 % ophthalmic solution INT 1 GTT IN OD QID FOR 2 WKS 12/22/15   Historical Provider, MD  pantoprazole (PROTONIX) 40 MG tablet TAKE 1 TABLET BY MOUTH EVERY DAY 08/04/15   Butch Penny, NP  rOPINIRole (REQUIP) 0.5 MG tablet Take 0.5 mg by mouth at bedtime as needed (restless leg).     Historical Provider, MD  sevelamer carbonate (RENVELA) 800 MG tablet Take 800 mg by mouth 3 (three) times daily with meals.    Historical Provider, MD  sodium bicarbonate 650 MG tablet Take 650 mg by mouth 2 (two) times daily.    Historical Provider, MD  TRAVATAN Z 0.004 % SOLN ophthalmic solution PLACE 1 DROP INTO LEFT EYE DAILY 12/25/15   Historical Provider, MD    Family History No family history on file.  Social History Social History  Substance Use Topics  . Smoking status: Former Smoker    Quit date: 06/28/1983  . Smokeless tobacco: Former Systems developer  . Alcohol use No     Allergies   Penicillins   Review of Systems Review of Systems  All other systems reviewed and are negative.    Physical Exam Updated Vital Signs BP 108/61   Pulse 81   Temp 98.4 F (36.9 C) (Oral)   Resp 17   Ht 6' (1.829 m)   Wt  204 lb (92.5 kg)   SpO2 98%   BMI 27.67 kg/m   Physical Exam  Constitutional: He is oriented to person, place, and time. He appears well-developed and well-nourished.  HENT:  Head: Normocephalic.  Eyes: EOM are normal.  Neck: Normal range of motion.  Pulmonary/Chest: Effort normal.  Abdominal: He exhibits no distension.  Musculoskeletal: Normal range of motion.  Neurological: He is alert and oriented to person, place, and time.  Skin:  Papular rash across his chest abdomen and back with signs of excoriation and multiple scabs from where his been itching.  No erythema.  No warmth.  No  vesicles.  No macular components.  Psychiatric: He has a normal mood and affect.  Nursing note and vitals reviewed.    ED Treatments / Results  Labs (all labs ordered are listed, but only abnormal results are displayed) Labs Reviewed - No data to display  EKG  EKG Interpretation None       Radiology No results found.  Procedures Procedures (including critical care time)  Medications Ordered in ED Medications - No data to display   Initial Impression / Assessment and Plan / ED Course  I have reviewed the triage vital signs and the nursing notes.  Pertinent labs & imaging results that were available during my care of the patient were reviewed by me and considered in my medical decision making (see chart for details).  Clinical Course     Suspect contact dermatitis.  Unknown trigger.  Recommended taking a close look at all of his home products.  I recommended Benadryl and hydrocortisone cream.  Dermatology follow-up.  Medical screening examination completed    Final Clinical Impressions(s) / ED Diagnoses   Final diagnoses:  Rash  Contact dermatitis, unspecified contact dermatitis type, unspecified trigger    New Prescriptions New Prescriptions   No medications on file     Jola Schmidt, MD 03/23/16 [phone]

## 2016-03-25 DIAGNOSIS — D509 Iron deficiency anemia, unspecified: Secondary | ICD-10-CM | POA: Diagnosis not present

## 2016-03-25 DIAGNOSIS — N2581 Secondary hyperparathyroidism of renal origin: Secondary | ICD-10-CM | POA: Diagnosis not present

## 2016-03-25 DIAGNOSIS — N186 End stage renal disease: Secondary | ICD-10-CM | POA: Diagnosis not present

## 2016-03-25 DIAGNOSIS — Z23 Encounter for immunization: Secondary | ICD-10-CM | POA: Diagnosis not present

## 2016-03-25 DIAGNOSIS — Z992 Dependence on renal dialysis: Secondary | ICD-10-CM | POA: Diagnosis not present

## 2016-03-28 DIAGNOSIS — N186 End stage renal disease: Secondary | ICD-10-CM | POA: Diagnosis not present

## 2016-03-28 DIAGNOSIS — Z992 Dependence on renal dialysis: Secondary | ICD-10-CM | POA: Diagnosis not present

## 2016-03-28 DIAGNOSIS — N2581 Secondary hyperparathyroidism of renal origin: Secondary | ICD-10-CM | POA: Diagnosis not present

## 2016-03-28 DIAGNOSIS — D509 Iron deficiency anemia, unspecified: Secondary | ICD-10-CM | POA: Diagnosis not present

## 2016-03-28 DIAGNOSIS — Z23 Encounter for immunization: Secondary | ICD-10-CM | POA: Diagnosis not present

## 2016-03-29 DIAGNOSIS — L259 Unspecified contact dermatitis, unspecified cause: Secondary | ICD-10-CM | POA: Diagnosis not present

## 2016-03-29 DIAGNOSIS — D0461 Carcinoma in situ of skin of right upper limb, including shoulder: Secondary | ICD-10-CM | POA: Diagnosis not present

## 2016-03-29 DIAGNOSIS — D485 Neoplasm of uncertain behavior of skin: Secondary | ICD-10-CM | POA: Diagnosis not present

## 2016-03-29 DIAGNOSIS — Z85828 Personal history of other malignant neoplasm of skin: Secondary | ICD-10-CM | POA: Diagnosis not present

## 2016-03-29 DIAGNOSIS — L858 Other specified epidermal thickening: Secondary | ICD-10-CM | POA: Diagnosis not present

## 2016-03-30 DIAGNOSIS — N186 End stage renal disease: Secondary | ICD-10-CM | POA: Diagnosis not present

## 2016-03-30 DIAGNOSIS — Z23 Encounter for immunization: Secondary | ICD-10-CM | POA: Diagnosis not present

## 2016-03-30 DIAGNOSIS — D509 Iron deficiency anemia, unspecified: Secondary | ICD-10-CM | POA: Diagnosis not present

## 2016-03-30 DIAGNOSIS — Z992 Dependence on renal dialysis: Secondary | ICD-10-CM | POA: Diagnosis not present

## 2016-03-30 DIAGNOSIS — N2581 Secondary hyperparathyroidism of renal origin: Secondary | ICD-10-CM | POA: Diagnosis not present

## 2016-04-01 DIAGNOSIS — Z23 Encounter for immunization: Secondary | ICD-10-CM | POA: Diagnosis not present

## 2016-04-01 DIAGNOSIS — Z992 Dependence on renal dialysis: Secondary | ICD-10-CM | POA: Diagnosis not present

## 2016-04-01 DIAGNOSIS — N186 End stage renal disease: Secondary | ICD-10-CM | POA: Diagnosis not present

## 2016-04-01 DIAGNOSIS — D509 Iron deficiency anemia, unspecified: Secondary | ICD-10-CM | POA: Diagnosis not present

## 2016-04-01 DIAGNOSIS — N2581 Secondary hyperparathyroidism of renal origin: Secondary | ICD-10-CM | POA: Diagnosis not present

## 2016-04-04 DIAGNOSIS — Z23 Encounter for immunization: Secondary | ICD-10-CM | POA: Diagnosis not present

## 2016-04-04 DIAGNOSIS — D509 Iron deficiency anemia, unspecified: Secondary | ICD-10-CM | POA: Diagnosis not present

## 2016-04-04 DIAGNOSIS — N186 End stage renal disease: Secondary | ICD-10-CM | POA: Diagnosis not present

## 2016-04-04 DIAGNOSIS — N2581 Secondary hyperparathyroidism of renal origin: Secondary | ICD-10-CM | POA: Diagnosis not present

## 2016-04-04 DIAGNOSIS — Z992 Dependence on renal dialysis: Secondary | ICD-10-CM | POA: Diagnosis not present

## 2016-04-05 DIAGNOSIS — H4062X1 Glaucoma secondary to drugs, left eye, mild stage: Secondary | ICD-10-CM | POA: Diagnosis not present

## 2016-04-05 DIAGNOSIS — H3322 Serous retinal detachment, left eye: Secondary | ICD-10-CM | POA: Diagnosis not present

## 2016-04-05 DIAGNOSIS — H33012 Retinal detachment with single break, left eye: Secondary | ICD-10-CM | POA: Diagnosis not present

## 2016-04-05 DIAGNOSIS — H40042 Steroid responder, left eye: Secondary | ICD-10-CM | POA: Diagnosis not present

## 2016-04-06 DIAGNOSIS — Z992 Dependence on renal dialysis: Secondary | ICD-10-CM | POA: Diagnosis not present

## 2016-04-06 DIAGNOSIS — D509 Iron deficiency anemia, unspecified: Secondary | ICD-10-CM | POA: Diagnosis not present

## 2016-04-06 DIAGNOSIS — N186 End stage renal disease: Secondary | ICD-10-CM | POA: Diagnosis not present

## 2016-04-06 DIAGNOSIS — Z23 Encounter for immunization: Secondary | ICD-10-CM | POA: Diagnosis not present

## 2016-04-06 DIAGNOSIS — N2581 Secondary hyperparathyroidism of renal origin: Secondary | ICD-10-CM | POA: Diagnosis not present

## 2016-04-08 DIAGNOSIS — D509 Iron deficiency anemia, unspecified: Secondary | ICD-10-CM | POA: Diagnosis not present

## 2016-04-08 DIAGNOSIS — N2581 Secondary hyperparathyroidism of renal origin: Secondary | ICD-10-CM | POA: Diagnosis not present

## 2016-04-08 DIAGNOSIS — Z992 Dependence on renal dialysis: Secondary | ICD-10-CM | POA: Diagnosis not present

## 2016-04-08 DIAGNOSIS — Z23 Encounter for immunization: Secondary | ICD-10-CM | POA: Diagnosis not present

## 2016-04-08 DIAGNOSIS — N186 End stage renal disease: Secondary | ICD-10-CM | POA: Diagnosis not present

## 2016-04-11 DIAGNOSIS — Z23 Encounter for immunization: Secondary | ICD-10-CM | POA: Diagnosis not present

## 2016-04-11 DIAGNOSIS — N186 End stage renal disease: Secondary | ICD-10-CM | POA: Diagnosis not present

## 2016-04-11 DIAGNOSIS — N2581 Secondary hyperparathyroidism of renal origin: Secondary | ICD-10-CM | POA: Diagnosis not present

## 2016-04-11 DIAGNOSIS — Z992 Dependence on renal dialysis: Secondary | ICD-10-CM | POA: Diagnosis not present

## 2016-04-11 DIAGNOSIS — D509 Iron deficiency anemia, unspecified: Secondary | ICD-10-CM | POA: Diagnosis not present

## 2016-04-13 DIAGNOSIS — N186 End stage renal disease: Secondary | ICD-10-CM | POA: Diagnosis not present

## 2016-04-13 DIAGNOSIS — N2581 Secondary hyperparathyroidism of renal origin: Secondary | ICD-10-CM | POA: Diagnosis not present

## 2016-04-13 DIAGNOSIS — D509 Iron deficiency anemia, unspecified: Secondary | ICD-10-CM | POA: Diagnosis not present

## 2016-04-13 DIAGNOSIS — Z992 Dependence on renal dialysis: Secondary | ICD-10-CM | POA: Diagnosis not present

## 2016-04-13 DIAGNOSIS — Z23 Encounter for immunization: Secondary | ICD-10-CM | POA: Diagnosis not present

## 2016-04-14 DIAGNOSIS — D509 Iron deficiency anemia, unspecified: Secondary | ICD-10-CM | POA: Diagnosis not present

## 2016-04-14 DIAGNOSIS — N2581 Secondary hyperparathyroidism of renal origin: Secondary | ICD-10-CM | POA: Diagnosis not present

## 2016-04-14 DIAGNOSIS — N186 End stage renal disease: Secondary | ICD-10-CM | POA: Diagnosis not present

## 2016-04-14 DIAGNOSIS — Z992 Dependence on renal dialysis: Secondary | ICD-10-CM | POA: Diagnosis not present

## 2016-04-15 DIAGNOSIS — D509 Iron deficiency anemia, unspecified: Secondary | ICD-10-CM | POA: Diagnosis not present

## 2016-04-15 DIAGNOSIS — Z992 Dependence on renal dialysis: Secondary | ICD-10-CM | POA: Diagnosis not present

## 2016-04-15 DIAGNOSIS — N2581 Secondary hyperparathyroidism of renal origin: Secondary | ICD-10-CM | POA: Diagnosis not present

## 2016-04-15 DIAGNOSIS — N186 End stage renal disease: Secondary | ICD-10-CM | POA: Diagnosis not present

## 2016-04-18 DIAGNOSIS — N2581 Secondary hyperparathyroidism of renal origin: Secondary | ICD-10-CM | POA: Diagnosis not present

## 2016-04-18 DIAGNOSIS — N186 End stage renal disease: Secondary | ICD-10-CM | POA: Diagnosis not present

## 2016-04-18 DIAGNOSIS — Z992 Dependence on renal dialysis: Secondary | ICD-10-CM | POA: Diagnosis not present

## 2016-04-18 DIAGNOSIS — D509 Iron deficiency anemia, unspecified: Secondary | ICD-10-CM | POA: Diagnosis not present

## 2016-04-19 DIAGNOSIS — Z992 Dependence on renal dialysis: Secondary | ICD-10-CM | POA: Diagnosis not present

## 2016-04-19 DIAGNOSIS — D509 Iron deficiency anemia, unspecified: Secondary | ICD-10-CM | POA: Diagnosis not present

## 2016-04-19 DIAGNOSIS — N2581 Secondary hyperparathyroidism of renal origin: Secondary | ICD-10-CM | POA: Diagnosis not present

## 2016-04-19 DIAGNOSIS — N186 End stage renal disease: Secondary | ICD-10-CM | POA: Diagnosis not present

## 2016-04-20 DIAGNOSIS — D509 Iron deficiency anemia, unspecified: Secondary | ICD-10-CM | POA: Diagnosis not present

## 2016-04-20 DIAGNOSIS — Z992 Dependence on renal dialysis: Secondary | ICD-10-CM | POA: Diagnosis not present

## 2016-04-20 DIAGNOSIS — N2581 Secondary hyperparathyroidism of renal origin: Secondary | ICD-10-CM | POA: Diagnosis not present

## 2016-04-20 DIAGNOSIS — N186 End stage renal disease: Secondary | ICD-10-CM | POA: Diagnosis not present

## 2016-04-21 DIAGNOSIS — C44622 Squamous cell carcinoma of skin of right upper limb, including shoulder: Secondary | ICD-10-CM | POA: Diagnosis not present

## 2016-04-22 DIAGNOSIS — D509 Iron deficiency anemia, unspecified: Secondary | ICD-10-CM | POA: Diagnosis not present

## 2016-04-22 DIAGNOSIS — N2581 Secondary hyperparathyroidism of renal origin: Secondary | ICD-10-CM | POA: Diagnosis not present

## 2016-04-22 DIAGNOSIS — N186 End stage renal disease: Secondary | ICD-10-CM | POA: Diagnosis not present

## 2016-04-22 DIAGNOSIS — Z992 Dependence on renal dialysis: Secondary | ICD-10-CM | POA: Diagnosis not present

## 2016-04-25 DIAGNOSIS — Z992 Dependence on renal dialysis: Secondary | ICD-10-CM | POA: Diagnosis not present

## 2016-04-25 DIAGNOSIS — N186 End stage renal disease: Secondary | ICD-10-CM | POA: Diagnosis not present

## 2016-04-25 DIAGNOSIS — D509 Iron deficiency anemia, unspecified: Secondary | ICD-10-CM | POA: Diagnosis not present

## 2016-04-25 DIAGNOSIS — N2581 Secondary hyperparathyroidism of renal origin: Secondary | ICD-10-CM | POA: Diagnosis not present

## 2016-04-26 DIAGNOSIS — E1142 Type 2 diabetes mellitus with diabetic polyneuropathy: Secondary | ICD-10-CM | POA: Diagnosis not present

## 2016-04-26 DIAGNOSIS — B351 Tinea unguium: Secondary | ICD-10-CM | POA: Diagnosis not present

## 2016-04-26 DIAGNOSIS — L851 Acquired keratosis [keratoderma] palmaris et plantaris: Secondary | ICD-10-CM | POA: Diagnosis not present

## 2016-04-27 DIAGNOSIS — D509 Iron deficiency anemia, unspecified: Secondary | ICD-10-CM | POA: Diagnosis not present

## 2016-04-27 DIAGNOSIS — N186 End stage renal disease: Secondary | ICD-10-CM | POA: Diagnosis not present

## 2016-04-27 DIAGNOSIS — Z992 Dependence on renal dialysis: Secondary | ICD-10-CM | POA: Diagnosis not present

## 2016-04-27 DIAGNOSIS — N2581 Secondary hyperparathyroidism of renal origin: Secondary | ICD-10-CM | POA: Diagnosis not present

## 2016-04-29 DIAGNOSIS — D509 Iron deficiency anemia, unspecified: Secondary | ICD-10-CM | POA: Diagnosis not present

## 2016-04-29 DIAGNOSIS — N2581 Secondary hyperparathyroidism of renal origin: Secondary | ICD-10-CM | POA: Diagnosis not present

## 2016-04-29 DIAGNOSIS — Z992 Dependence on renal dialysis: Secondary | ICD-10-CM | POA: Diagnosis not present

## 2016-04-29 DIAGNOSIS — N186 End stage renal disease: Secondary | ICD-10-CM | POA: Diagnosis not present

## 2016-05-02 DIAGNOSIS — N186 End stage renal disease: Secondary | ICD-10-CM | POA: Diagnosis not present

## 2016-05-02 DIAGNOSIS — Z992 Dependence on renal dialysis: Secondary | ICD-10-CM | POA: Diagnosis not present

## 2016-05-02 DIAGNOSIS — D509 Iron deficiency anemia, unspecified: Secondary | ICD-10-CM | POA: Diagnosis not present

## 2016-05-02 DIAGNOSIS — N2581 Secondary hyperparathyroidism of renal origin: Secondary | ICD-10-CM | POA: Diagnosis not present

## 2016-05-04 DIAGNOSIS — N2581 Secondary hyperparathyroidism of renal origin: Secondary | ICD-10-CM | POA: Diagnosis not present

## 2016-05-04 DIAGNOSIS — D509 Iron deficiency anemia, unspecified: Secondary | ICD-10-CM | POA: Diagnosis not present

## 2016-05-04 DIAGNOSIS — Z992 Dependence on renal dialysis: Secondary | ICD-10-CM | POA: Diagnosis not present

## 2016-05-04 DIAGNOSIS — N186 End stage renal disease: Secondary | ICD-10-CM | POA: Diagnosis not present

## 2016-05-06 DIAGNOSIS — D509 Iron deficiency anemia, unspecified: Secondary | ICD-10-CM | POA: Diagnosis not present

## 2016-05-06 DIAGNOSIS — Z992 Dependence on renal dialysis: Secondary | ICD-10-CM | POA: Diagnosis not present

## 2016-05-06 DIAGNOSIS — N186 End stage renal disease: Secondary | ICD-10-CM | POA: Diagnosis not present

## 2016-05-06 DIAGNOSIS — N2581 Secondary hyperparathyroidism of renal origin: Secondary | ICD-10-CM | POA: Diagnosis not present

## 2016-05-09 DIAGNOSIS — D509 Iron deficiency anemia, unspecified: Secondary | ICD-10-CM | POA: Diagnosis not present

## 2016-05-09 DIAGNOSIS — N2581 Secondary hyperparathyroidism of renal origin: Secondary | ICD-10-CM | POA: Diagnosis not present

## 2016-05-09 DIAGNOSIS — N186 End stage renal disease: Secondary | ICD-10-CM | POA: Diagnosis not present

## 2016-05-09 DIAGNOSIS — Z992 Dependence on renal dialysis: Secondary | ICD-10-CM | POA: Diagnosis not present

## 2016-05-10 DIAGNOSIS — Z961 Presence of intraocular lens: Secondary | ICD-10-CM | POA: Diagnosis not present

## 2016-05-10 DIAGNOSIS — E119 Type 2 diabetes mellitus without complications: Secondary | ICD-10-CM | POA: Diagnosis not present

## 2016-05-11 DIAGNOSIS — N186 End stage renal disease: Secondary | ICD-10-CM | POA: Diagnosis not present

## 2016-05-11 DIAGNOSIS — D509 Iron deficiency anemia, unspecified: Secondary | ICD-10-CM | POA: Diagnosis not present

## 2016-05-11 DIAGNOSIS — Z992 Dependence on renal dialysis: Secondary | ICD-10-CM | POA: Diagnosis not present

## 2016-05-11 DIAGNOSIS — N2581 Secondary hyperparathyroidism of renal origin: Secondary | ICD-10-CM | POA: Diagnosis not present

## 2016-05-12 DIAGNOSIS — Z992 Dependence on renal dialysis: Secondary | ICD-10-CM | POA: Diagnosis not present

## 2016-05-12 DIAGNOSIS — N186 End stage renal disease: Secondary | ICD-10-CM | POA: Diagnosis not present

## 2016-05-12 DIAGNOSIS — N2581 Secondary hyperparathyroidism of renal origin: Secondary | ICD-10-CM | POA: Diagnosis not present

## 2016-05-12 DIAGNOSIS — D509 Iron deficiency anemia, unspecified: Secondary | ICD-10-CM | POA: Diagnosis not present

## 2016-05-13 DIAGNOSIS — Z992 Dependence on renal dialysis: Secondary | ICD-10-CM | POA: Diagnosis not present

## 2016-05-13 DIAGNOSIS — N186 End stage renal disease: Secondary | ICD-10-CM | POA: Diagnosis not present

## 2016-05-13 DIAGNOSIS — D509 Iron deficiency anemia, unspecified: Secondary | ICD-10-CM | POA: Diagnosis not present

## 2016-05-13 DIAGNOSIS — N2581 Secondary hyperparathyroidism of renal origin: Secondary | ICD-10-CM | POA: Diagnosis not present

## 2016-05-16 DIAGNOSIS — N2581 Secondary hyperparathyroidism of renal origin: Secondary | ICD-10-CM | POA: Diagnosis not present

## 2016-05-16 DIAGNOSIS — Z992 Dependence on renal dialysis: Secondary | ICD-10-CM | POA: Diagnosis not present

## 2016-05-16 DIAGNOSIS — N186 End stage renal disease: Secondary | ICD-10-CM | POA: Diagnosis not present

## 2016-05-16 DIAGNOSIS — D509 Iron deficiency anemia, unspecified: Secondary | ICD-10-CM | POA: Diagnosis not present

## 2016-05-17 DIAGNOSIS — N2581 Secondary hyperparathyroidism of renal origin: Secondary | ICD-10-CM | POA: Diagnosis not present

## 2016-05-17 DIAGNOSIS — N186 End stage renal disease: Secondary | ICD-10-CM | POA: Diagnosis not present

## 2016-05-17 DIAGNOSIS — D509 Iron deficiency anemia, unspecified: Secondary | ICD-10-CM | POA: Diagnosis not present

## 2016-05-17 DIAGNOSIS — Z992 Dependence on renal dialysis: Secondary | ICD-10-CM | POA: Diagnosis not present

## 2016-05-18 DIAGNOSIS — N2581 Secondary hyperparathyroidism of renal origin: Secondary | ICD-10-CM | POA: Diagnosis not present

## 2016-05-18 DIAGNOSIS — D509 Iron deficiency anemia, unspecified: Secondary | ICD-10-CM | POA: Diagnosis not present

## 2016-05-18 DIAGNOSIS — Z992 Dependence on renal dialysis: Secondary | ICD-10-CM | POA: Diagnosis not present

## 2016-05-18 DIAGNOSIS — N186 End stage renal disease: Secondary | ICD-10-CM | POA: Diagnosis not present

## 2016-05-20 DIAGNOSIS — Z992 Dependence on renal dialysis: Secondary | ICD-10-CM | POA: Diagnosis not present

## 2016-05-20 DIAGNOSIS — N186 End stage renal disease: Secondary | ICD-10-CM | POA: Diagnosis not present

## 2016-05-20 DIAGNOSIS — N2581 Secondary hyperparathyroidism of renal origin: Secondary | ICD-10-CM | POA: Diagnosis not present

## 2016-05-20 DIAGNOSIS — D509 Iron deficiency anemia, unspecified: Secondary | ICD-10-CM | POA: Diagnosis not present

## 2016-05-23 DIAGNOSIS — N2581 Secondary hyperparathyroidism of renal origin: Secondary | ICD-10-CM | POA: Diagnosis not present

## 2016-05-23 DIAGNOSIS — Z992 Dependence on renal dialysis: Secondary | ICD-10-CM | POA: Diagnosis not present

## 2016-05-23 DIAGNOSIS — D509 Iron deficiency anemia, unspecified: Secondary | ICD-10-CM | POA: Diagnosis not present

## 2016-05-23 DIAGNOSIS — N186 End stage renal disease: Secondary | ICD-10-CM | POA: Diagnosis not present

## 2016-05-25 DIAGNOSIS — Z992 Dependence on renal dialysis: Secondary | ICD-10-CM | POA: Diagnosis not present

## 2016-05-25 DIAGNOSIS — D509 Iron deficiency anemia, unspecified: Secondary | ICD-10-CM | POA: Diagnosis not present

## 2016-05-25 DIAGNOSIS — N186 End stage renal disease: Secondary | ICD-10-CM | POA: Diagnosis not present

## 2016-05-25 DIAGNOSIS — N2581 Secondary hyperparathyroidism of renal origin: Secondary | ICD-10-CM | POA: Diagnosis not present

## 2016-05-27 DIAGNOSIS — N2581 Secondary hyperparathyroidism of renal origin: Secondary | ICD-10-CM | POA: Diagnosis not present

## 2016-05-27 DIAGNOSIS — Z992 Dependence on renal dialysis: Secondary | ICD-10-CM | POA: Diagnosis not present

## 2016-05-27 DIAGNOSIS — N186 End stage renal disease: Secondary | ICD-10-CM | POA: Diagnosis not present

## 2016-05-27 DIAGNOSIS — D509 Iron deficiency anemia, unspecified: Secondary | ICD-10-CM | POA: Diagnosis not present

## 2016-05-30 DIAGNOSIS — Z992 Dependence on renal dialysis: Secondary | ICD-10-CM | POA: Diagnosis not present

## 2016-05-30 DIAGNOSIS — N2581 Secondary hyperparathyroidism of renal origin: Secondary | ICD-10-CM | POA: Diagnosis not present

## 2016-05-30 DIAGNOSIS — D509 Iron deficiency anemia, unspecified: Secondary | ICD-10-CM | POA: Diagnosis not present

## 2016-05-30 DIAGNOSIS — N186 End stage renal disease: Secondary | ICD-10-CM | POA: Diagnosis not present

## 2016-06-01 DIAGNOSIS — N2581 Secondary hyperparathyroidism of renal origin: Secondary | ICD-10-CM | POA: Diagnosis not present

## 2016-06-01 DIAGNOSIS — Z992 Dependence on renal dialysis: Secondary | ICD-10-CM | POA: Diagnosis not present

## 2016-06-01 DIAGNOSIS — D509 Iron deficiency anemia, unspecified: Secondary | ICD-10-CM | POA: Diagnosis not present

## 2016-06-01 DIAGNOSIS — N186 End stage renal disease: Secondary | ICD-10-CM | POA: Diagnosis not present

## 2016-06-03 DIAGNOSIS — N2581 Secondary hyperparathyroidism of renal origin: Secondary | ICD-10-CM | POA: Diagnosis not present

## 2016-06-03 DIAGNOSIS — N186 End stage renal disease: Secondary | ICD-10-CM | POA: Diagnosis not present

## 2016-06-03 DIAGNOSIS — Z992 Dependence on renal dialysis: Secondary | ICD-10-CM | POA: Diagnosis not present

## 2016-06-03 DIAGNOSIS — D509 Iron deficiency anemia, unspecified: Secondary | ICD-10-CM | POA: Diagnosis not present

## 2016-06-06 DIAGNOSIS — N2581 Secondary hyperparathyroidism of renal origin: Secondary | ICD-10-CM | POA: Diagnosis not present

## 2016-06-06 DIAGNOSIS — D509 Iron deficiency anemia, unspecified: Secondary | ICD-10-CM | POA: Diagnosis not present

## 2016-06-06 DIAGNOSIS — N186 End stage renal disease: Secondary | ICD-10-CM | POA: Diagnosis not present

## 2016-06-06 DIAGNOSIS — Z992 Dependence on renal dialysis: Secondary | ICD-10-CM | POA: Diagnosis not present

## 2016-06-07 ENCOUNTER — Other Ambulatory Visit (INDEPENDENT_AMBULATORY_CARE_PROVIDER_SITE_OTHER): Payer: Self-pay | Admitting: Internal Medicine

## 2016-06-08 DIAGNOSIS — N2581 Secondary hyperparathyroidism of renal origin: Secondary | ICD-10-CM | POA: Diagnosis not present

## 2016-06-08 DIAGNOSIS — D509 Iron deficiency anemia, unspecified: Secondary | ICD-10-CM | POA: Diagnosis not present

## 2016-06-08 DIAGNOSIS — N186 End stage renal disease: Secondary | ICD-10-CM | POA: Diagnosis not present

## 2016-06-08 DIAGNOSIS — Z992 Dependence on renal dialysis: Secondary | ICD-10-CM | POA: Diagnosis not present

## 2016-06-10 DIAGNOSIS — N186 End stage renal disease: Secondary | ICD-10-CM | POA: Diagnosis not present

## 2016-06-10 DIAGNOSIS — N2581 Secondary hyperparathyroidism of renal origin: Secondary | ICD-10-CM | POA: Diagnosis not present

## 2016-06-10 DIAGNOSIS — Z992 Dependence on renal dialysis: Secondary | ICD-10-CM | POA: Diagnosis not present

## 2016-06-10 DIAGNOSIS — D509 Iron deficiency anemia, unspecified: Secondary | ICD-10-CM | POA: Diagnosis not present

## 2016-06-11 DIAGNOSIS — Z992 Dependence on renal dialysis: Secondary | ICD-10-CM | POA: Diagnosis not present

## 2016-06-11 DIAGNOSIS — N186 End stage renal disease: Secondary | ICD-10-CM | POA: Diagnosis not present

## 2016-06-12 DIAGNOSIS — D509 Iron deficiency anemia, unspecified: Secondary | ICD-10-CM | POA: Diagnosis not present

## 2016-06-12 DIAGNOSIS — N186 End stage renal disease: Secondary | ICD-10-CM | POA: Diagnosis not present

## 2016-06-12 DIAGNOSIS — Z992 Dependence on renal dialysis: Secondary | ICD-10-CM | POA: Diagnosis not present

## 2016-06-12 DIAGNOSIS — N2581 Secondary hyperparathyroidism of renal origin: Secondary | ICD-10-CM | POA: Diagnosis not present

## 2016-06-13 DIAGNOSIS — N2581 Secondary hyperparathyroidism of renal origin: Secondary | ICD-10-CM | POA: Diagnosis not present

## 2016-06-13 DIAGNOSIS — Z992 Dependence on renal dialysis: Secondary | ICD-10-CM | POA: Diagnosis not present

## 2016-06-13 DIAGNOSIS — N186 End stage renal disease: Secondary | ICD-10-CM | POA: Diagnosis not present

## 2016-06-13 DIAGNOSIS — D509 Iron deficiency anemia, unspecified: Secondary | ICD-10-CM | POA: Diagnosis not present

## 2016-06-15 DIAGNOSIS — N186 End stage renal disease: Secondary | ICD-10-CM | POA: Diagnosis not present

## 2016-06-15 DIAGNOSIS — Z992 Dependence on renal dialysis: Secondary | ICD-10-CM | POA: Diagnosis not present

## 2016-06-15 DIAGNOSIS — D509 Iron deficiency anemia, unspecified: Secondary | ICD-10-CM | POA: Diagnosis not present

## 2016-06-15 DIAGNOSIS — N2581 Secondary hyperparathyroidism of renal origin: Secondary | ICD-10-CM | POA: Diagnosis not present

## 2016-06-16 DIAGNOSIS — N186 End stage renal disease: Secondary | ICD-10-CM | POA: Diagnosis not present

## 2016-06-16 DIAGNOSIS — D509 Iron deficiency anemia, unspecified: Secondary | ICD-10-CM | POA: Diagnosis not present

## 2016-06-16 DIAGNOSIS — Z992 Dependence on renal dialysis: Secondary | ICD-10-CM | POA: Diagnosis not present

## 2016-06-16 DIAGNOSIS — N2581 Secondary hyperparathyroidism of renal origin: Secondary | ICD-10-CM | POA: Diagnosis not present

## 2016-06-17 DIAGNOSIS — Z992 Dependence on renal dialysis: Secondary | ICD-10-CM | POA: Diagnosis not present

## 2016-06-17 DIAGNOSIS — D509 Iron deficiency anemia, unspecified: Secondary | ICD-10-CM | POA: Diagnosis not present

## 2016-06-17 DIAGNOSIS — N2581 Secondary hyperparathyroidism of renal origin: Secondary | ICD-10-CM | POA: Diagnosis not present

## 2016-06-17 DIAGNOSIS — N186 End stage renal disease: Secondary | ICD-10-CM | POA: Diagnosis not present

## 2016-06-20 DIAGNOSIS — N186 End stage renal disease: Secondary | ICD-10-CM | POA: Diagnosis not present

## 2016-06-20 DIAGNOSIS — N2581 Secondary hyperparathyroidism of renal origin: Secondary | ICD-10-CM | POA: Diagnosis not present

## 2016-06-20 DIAGNOSIS — Z992 Dependence on renal dialysis: Secondary | ICD-10-CM | POA: Diagnosis not present

## 2016-06-20 DIAGNOSIS — D509 Iron deficiency anemia, unspecified: Secondary | ICD-10-CM | POA: Diagnosis not present

## 2016-06-22 DIAGNOSIS — N186 End stage renal disease: Secondary | ICD-10-CM | POA: Diagnosis not present

## 2016-06-22 DIAGNOSIS — N2581 Secondary hyperparathyroidism of renal origin: Secondary | ICD-10-CM | POA: Diagnosis not present

## 2016-06-22 DIAGNOSIS — Z992 Dependence on renal dialysis: Secondary | ICD-10-CM | POA: Diagnosis not present

## 2016-06-22 DIAGNOSIS — D509 Iron deficiency anemia, unspecified: Secondary | ICD-10-CM | POA: Diagnosis not present

## 2016-06-24 DIAGNOSIS — N2581 Secondary hyperparathyroidism of renal origin: Secondary | ICD-10-CM | POA: Diagnosis not present

## 2016-06-24 DIAGNOSIS — Z992 Dependence on renal dialysis: Secondary | ICD-10-CM | POA: Diagnosis not present

## 2016-06-24 DIAGNOSIS — N186 End stage renal disease: Secondary | ICD-10-CM | POA: Diagnosis not present

## 2016-06-24 DIAGNOSIS — D509 Iron deficiency anemia, unspecified: Secondary | ICD-10-CM | POA: Diagnosis not present

## 2016-06-27 DIAGNOSIS — N186 End stage renal disease: Secondary | ICD-10-CM | POA: Diagnosis not present

## 2016-06-27 DIAGNOSIS — Z992 Dependence on renal dialysis: Secondary | ICD-10-CM | POA: Diagnosis not present

## 2016-06-27 DIAGNOSIS — D509 Iron deficiency anemia, unspecified: Secondary | ICD-10-CM | POA: Diagnosis not present

## 2016-06-27 DIAGNOSIS — N2581 Secondary hyperparathyroidism of renal origin: Secondary | ICD-10-CM | POA: Diagnosis not present

## 2016-06-29 DIAGNOSIS — D509 Iron deficiency anemia, unspecified: Secondary | ICD-10-CM | POA: Diagnosis not present

## 2016-06-29 DIAGNOSIS — N186 End stage renal disease: Secondary | ICD-10-CM | POA: Diagnosis not present

## 2016-06-29 DIAGNOSIS — Z992 Dependence on renal dialysis: Secondary | ICD-10-CM | POA: Diagnosis not present

## 2016-06-29 DIAGNOSIS — N2581 Secondary hyperparathyroidism of renal origin: Secondary | ICD-10-CM | POA: Diagnosis not present

## 2016-07-01 DIAGNOSIS — N186 End stage renal disease: Secondary | ICD-10-CM | POA: Diagnosis not present

## 2016-07-01 DIAGNOSIS — N2581 Secondary hyperparathyroidism of renal origin: Secondary | ICD-10-CM | POA: Diagnosis not present

## 2016-07-01 DIAGNOSIS — D509 Iron deficiency anemia, unspecified: Secondary | ICD-10-CM | POA: Diagnosis not present

## 2016-07-01 DIAGNOSIS — Z992 Dependence on renal dialysis: Secondary | ICD-10-CM | POA: Diagnosis not present

## 2016-07-04 DIAGNOSIS — D509 Iron deficiency anemia, unspecified: Secondary | ICD-10-CM | POA: Diagnosis not present

## 2016-07-04 DIAGNOSIS — N186 End stage renal disease: Secondary | ICD-10-CM | POA: Diagnosis not present

## 2016-07-04 DIAGNOSIS — N2581 Secondary hyperparathyroidism of renal origin: Secondary | ICD-10-CM | POA: Diagnosis not present

## 2016-07-04 DIAGNOSIS — Z992 Dependence on renal dialysis: Secondary | ICD-10-CM | POA: Diagnosis not present

## 2016-07-05 DIAGNOSIS — B351 Tinea unguium: Secondary | ICD-10-CM | POA: Diagnosis not present

## 2016-07-05 DIAGNOSIS — E1142 Type 2 diabetes mellitus with diabetic polyneuropathy: Secondary | ICD-10-CM | POA: Diagnosis not present

## 2016-07-05 DIAGNOSIS — L851 Acquired keratosis [keratoderma] palmaris et plantaris: Secondary | ICD-10-CM | POA: Diagnosis not present

## 2016-07-06 DIAGNOSIS — N2581 Secondary hyperparathyroidism of renal origin: Secondary | ICD-10-CM | POA: Diagnosis not present

## 2016-07-06 DIAGNOSIS — Z992 Dependence on renal dialysis: Secondary | ICD-10-CM | POA: Diagnosis not present

## 2016-07-06 DIAGNOSIS — D509 Iron deficiency anemia, unspecified: Secondary | ICD-10-CM | POA: Diagnosis not present

## 2016-07-06 DIAGNOSIS — N186 End stage renal disease: Secondary | ICD-10-CM | POA: Diagnosis not present

## 2016-07-08 DIAGNOSIS — N186 End stage renal disease: Secondary | ICD-10-CM | POA: Diagnosis not present

## 2016-07-08 DIAGNOSIS — D509 Iron deficiency anemia, unspecified: Secondary | ICD-10-CM | POA: Diagnosis not present

## 2016-07-08 DIAGNOSIS — Z992 Dependence on renal dialysis: Secondary | ICD-10-CM | POA: Diagnosis not present

## 2016-07-08 DIAGNOSIS — N2581 Secondary hyperparathyroidism of renal origin: Secondary | ICD-10-CM | POA: Diagnosis not present

## 2016-07-11 DIAGNOSIS — D509 Iron deficiency anemia, unspecified: Secondary | ICD-10-CM | POA: Diagnosis not present

## 2016-07-11 DIAGNOSIS — Z992 Dependence on renal dialysis: Secondary | ICD-10-CM | POA: Diagnosis not present

## 2016-07-11 DIAGNOSIS — N186 End stage renal disease: Secondary | ICD-10-CM | POA: Diagnosis not present

## 2016-07-11 DIAGNOSIS — N2581 Secondary hyperparathyroidism of renal origin: Secondary | ICD-10-CM | POA: Diagnosis not present

## 2016-07-12 DIAGNOSIS — D509 Iron deficiency anemia, unspecified: Secondary | ICD-10-CM | POA: Diagnosis not present

## 2016-07-12 DIAGNOSIS — N186 End stage renal disease: Secondary | ICD-10-CM | POA: Diagnosis not present

## 2016-07-12 DIAGNOSIS — Z992 Dependence on renal dialysis: Secondary | ICD-10-CM | POA: Diagnosis not present

## 2016-07-12 DIAGNOSIS — N2581 Secondary hyperparathyroidism of renal origin: Secondary | ICD-10-CM | POA: Diagnosis not present

## 2016-07-13 DIAGNOSIS — N186 End stage renal disease: Secondary | ICD-10-CM | POA: Diagnosis not present

## 2016-07-13 DIAGNOSIS — N2581 Secondary hyperparathyroidism of renal origin: Secondary | ICD-10-CM | POA: Diagnosis not present

## 2016-07-13 DIAGNOSIS — Z992 Dependence on renal dialysis: Secondary | ICD-10-CM | POA: Diagnosis not present

## 2016-07-13 DIAGNOSIS — D509 Iron deficiency anemia, unspecified: Secondary | ICD-10-CM | POA: Diagnosis not present

## 2016-07-15 DIAGNOSIS — Z992 Dependence on renal dialysis: Secondary | ICD-10-CM | POA: Diagnosis not present

## 2016-07-15 DIAGNOSIS — D509 Iron deficiency anemia, unspecified: Secondary | ICD-10-CM | POA: Diagnosis not present

## 2016-07-15 DIAGNOSIS — N186 End stage renal disease: Secondary | ICD-10-CM | POA: Diagnosis not present

## 2016-07-15 DIAGNOSIS — N2581 Secondary hyperparathyroidism of renal origin: Secondary | ICD-10-CM | POA: Diagnosis not present

## 2016-07-17 DIAGNOSIS — D509 Iron deficiency anemia, unspecified: Secondary | ICD-10-CM | POA: Diagnosis not present

## 2016-07-17 DIAGNOSIS — Z992 Dependence on renal dialysis: Secondary | ICD-10-CM | POA: Diagnosis not present

## 2016-07-17 DIAGNOSIS — N186 End stage renal disease: Secondary | ICD-10-CM | POA: Diagnosis not present

## 2016-07-17 DIAGNOSIS — N2581 Secondary hyperparathyroidism of renal origin: Secondary | ICD-10-CM | POA: Diagnosis not present

## 2016-07-18 DIAGNOSIS — N186 End stage renal disease: Secondary | ICD-10-CM | POA: Diagnosis not present

## 2016-07-18 DIAGNOSIS — Z992 Dependence on renal dialysis: Secondary | ICD-10-CM | POA: Diagnosis not present

## 2016-07-18 DIAGNOSIS — D509 Iron deficiency anemia, unspecified: Secondary | ICD-10-CM | POA: Diagnosis not present

## 2016-07-18 DIAGNOSIS — N2581 Secondary hyperparathyroidism of renal origin: Secondary | ICD-10-CM | POA: Diagnosis not present

## 2016-07-20 DIAGNOSIS — N186 End stage renal disease: Secondary | ICD-10-CM | POA: Diagnosis not present

## 2016-07-20 DIAGNOSIS — N2581 Secondary hyperparathyroidism of renal origin: Secondary | ICD-10-CM | POA: Diagnosis not present

## 2016-07-20 DIAGNOSIS — Z992 Dependence on renal dialysis: Secondary | ICD-10-CM | POA: Diagnosis not present

## 2016-07-20 DIAGNOSIS — D509 Iron deficiency anemia, unspecified: Secondary | ICD-10-CM | POA: Diagnosis not present

## 2016-07-22 DIAGNOSIS — N186 End stage renal disease: Secondary | ICD-10-CM | POA: Diagnosis not present

## 2016-07-22 DIAGNOSIS — N2581 Secondary hyperparathyroidism of renal origin: Secondary | ICD-10-CM | POA: Diagnosis not present

## 2016-07-22 DIAGNOSIS — D509 Iron deficiency anemia, unspecified: Secondary | ICD-10-CM | POA: Diagnosis not present

## 2016-07-22 DIAGNOSIS — Z992 Dependence on renal dialysis: Secondary | ICD-10-CM | POA: Diagnosis not present

## 2016-07-25 DIAGNOSIS — N2581 Secondary hyperparathyroidism of renal origin: Secondary | ICD-10-CM | POA: Diagnosis not present

## 2016-07-25 DIAGNOSIS — Z992 Dependence on renal dialysis: Secondary | ICD-10-CM | POA: Diagnosis not present

## 2016-07-25 DIAGNOSIS — D509 Iron deficiency anemia, unspecified: Secondary | ICD-10-CM | POA: Diagnosis not present

## 2016-07-25 DIAGNOSIS — N186 End stage renal disease: Secondary | ICD-10-CM | POA: Diagnosis not present

## 2016-07-27 DIAGNOSIS — D509 Iron deficiency anemia, unspecified: Secondary | ICD-10-CM | POA: Diagnosis not present

## 2016-07-27 DIAGNOSIS — Z992 Dependence on renal dialysis: Secondary | ICD-10-CM | POA: Diagnosis not present

## 2016-07-27 DIAGNOSIS — N2581 Secondary hyperparathyroidism of renal origin: Secondary | ICD-10-CM | POA: Diagnosis not present

## 2016-07-27 DIAGNOSIS — N186 End stage renal disease: Secondary | ICD-10-CM | POA: Diagnosis not present

## 2016-07-29 DIAGNOSIS — D509 Iron deficiency anemia, unspecified: Secondary | ICD-10-CM | POA: Diagnosis not present

## 2016-07-29 DIAGNOSIS — N2581 Secondary hyperparathyroidism of renal origin: Secondary | ICD-10-CM | POA: Diagnosis not present

## 2016-07-29 DIAGNOSIS — Z992 Dependence on renal dialysis: Secondary | ICD-10-CM | POA: Diagnosis not present

## 2016-07-29 DIAGNOSIS — N186 End stage renal disease: Secondary | ICD-10-CM | POA: Diagnosis not present

## 2016-08-01 DIAGNOSIS — Z992 Dependence on renal dialysis: Secondary | ICD-10-CM | POA: Diagnosis not present

## 2016-08-01 DIAGNOSIS — D509 Iron deficiency anemia, unspecified: Secondary | ICD-10-CM | POA: Diagnosis not present

## 2016-08-01 DIAGNOSIS — N186 End stage renal disease: Secondary | ICD-10-CM | POA: Diagnosis not present

## 2016-08-01 DIAGNOSIS — N2581 Secondary hyperparathyroidism of renal origin: Secondary | ICD-10-CM | POA: Diagnosis not present

## 2016-08-03 DIAGNOSIS — Z992 Dependence on renal dialysis: Secondary | ICD-10-CM | POA: Diagnosis not present

## 2016-08-03 DIAGNOSIS — D509 Iron deficiency anemia, unspecified: Secondary | ICD-10-CM | POA: Diagnosis not present

## 2016-08-03 DIAGNOSIS — N2581 Secondary hyperparathyroidism of renal origin: Secondary | ICD-10-CM | POA: Diagnosis not present

## 2016-08-03 DIAGNOSIS — N186 End stage renal disease: Secondary | ICD-10-CM | POA: Diagnosis not present

## 2016-08-04 DIAGNOSIS — H353121 Nonexudative age-related macular degeneration, left eye, early dry stage: Secondary | ICD-10-CM | POA: Diagnosis not present

## 2016-08-04 DIAGNOSIS — H40042 Steroid responder, left eye: Secondary | ICD-10-CM | POA: Diagnosis not present

## 2016-08-04 DIAGNOSIS — H353111 Nonexudative age-related macular degeneration, right eye, early dry stage: Secondary | ICD-10-CM | POA: Diagnosis not present

## 2016-08-04 DIAGNOSIS — H33012 Retinal detachment with single break, left eye: Secondary | ICD-10-CM | POA: Diagnosis not present

## 2016-08-05 DIAGNOSIS — N2581 Secondary hyperparathyroidism of renal origin: Secondary | ICD-10-CM | POA: Diagnosis not present

## 2016-08-05 DIAGNOSIS — D509 Iron deficiency anemia, unspecified: Secondary | ICD-10-CM | POA: Diagnosis not present

## 2016-08-05 DIAGNOSIS — N186 End stage renal disease: Secondary | ICD-10-CM | POA: Diagnosis not present

## 2016-08-05 DIAGNOSIS — Z992 Dependence on renal dialysis: Secondary | ICD-10-CM | POA: Diagnosis not present

## 2016-08-08 DIAGNOSIS — N2581 Secondary hyperparathyroidism of renal origin: Secondary | ICD-10-CM | POA: Diagnosis not present

## 2016-08-08 DIAGNOSIS — D509 Iron deficiency anemia, unspecified: Secondary | ICD-10-CM | POA: Diagnosis not present

## 2016-08-08 DIAGNOSIS — N186 End stage renal disease: Secondary | ICD-10-CM | POA: Diagnosis not present

## 2016-08-08 DIAGNOSIS — Z992 Dependence on renal dialysis: Secondary | ICD-10-CM | POA: Diagnosis not present

## 2016-08-10 DIAGNOSIS — D509 Iron deficiency anemia, unspecified: Secondary | ICD-10-CM | POA: Diagnosis not present

## 2016-08-10 DIAGNOSIS — Z992 Dependence on renal dialysis: Secondary | ICD-10-CM | POA: Diagnosis not present

## 2016-08-10 DIAGNOSIS — N186 End stage renal disease: Secondary | ICD-10-CM | POA: Diagnosis not present

## 2016-08-10 DIAGNOSIS — N2581 Secondary hyperparathyroidism of renal origin: Secondary | ICD-10-CM | POA: Diagnosis not present

## 2016-08-11 DIAGNOSIS — Z992 Dependence on renal dialysis: Secondary | ICD-10-CM | POA: Diagnosis not present

## 2016-08-11 DIAGNOSIS — I10 Essential (primary) hypertension: Secondary | ICD-10-CM | POA: Diagnosis not present

## 2016-08-11 DIAGNOSIS — N186 End stage renal disease: Secondary | ICD-10-CM | POA: Diagnosis not present

## 2016-08-11 DIAGNOSIS — G2581 Restless legs syndrome: Secondary | ICD-10-CM | POA: Diagnosis not present

## 2016-08-12 DIAGNOSIS — N186 End stage renal disease: Secondary | ICD-10-CM | POA: Diagnosis not present

## 2016-08-12 DIAGNOSIS — Z992 Dependence on renal dialysis: Secondary | ICD-10-CM | POA: Diagnosis not present

## 2016-08-12 DIAGNOSIS — D509 Iron deficiency anemia, unspecified: Secondary | ICD-10-CM | POA: Diagnosis not present

## 2016-08-12 DIAGNOSIS — N2581 Secondary hyperparathyroidism of renal origin: Secondary | ICD-10-CM | POA: Diagnosis not present

## 2016-08-15 DIAGNOSIS — N2581 Secondary hyperparathyroidism of renal origin: Secondary | ICD-10-CM | POA: Diagnosis not present

## 2016-08-15 DIAGNOSIS — D509 Iron deficiency anemia, unspecified: Secondary | ICD-10-CM | POA: Diagnosis not present

## 2016-08-15 DIAGNOSIS — Z992 Dependence on renal dialysis: Secondary | ICD-10-CM | POA: Diagnosis not present

## 2016-08-15 DIAGNOSIS — N186 End stage renal disease: Secondary | ICD-10-CM | POA: Diagnosis not present

## 2016-08-16 DIAGNOSIS — N2581 Secondary hyperparathyroidism of renal origin: Secondary | ICD-10-CM | POA: Diagnosis not present

## 2016-08-16 DIAGNOSIS — D509 Iron deficiency anemia, unspecified: Secondary | ICD-10-CM | POA: Diagnosis not present

## 2016-08-16 DIAGNOSIS — Z992 Dependence on renal dialysis: Secondary | ICD-10-CM | POA: Diagnosis not present

## 2016-08-16 DIAGNOSIS — N186 End stage renal disease: Secondary | ICD-10-CM | POA: Diagnosis not present

## 2016-08-17 DIAGNOSIS — N2581 Secondary hyperparathyroidism of renal origin: Secondary | ICD-10-CM | POA: Diagnosis not present

## 2016-08-17 DIAGNOSIS — Z992 Dependence on renal dialysis: Secondary | ICD-10-CM | POA: Diagnosis not present

## 2016-08-17 DIAGNOSIS — D509 Iron deficiency anemia, unspecified: Secondary | ICD-10-CM | POA: Diagnosis not present

## 2016-08-17 DIAGNOSIS — N186 End stage renal disease: Secondary | ICD-10-CM | POA: Diagnosis not present

## 2016-08-19 DIAGNOSIS — D509 Iron deficiency anemia, unspecified: Secondary | ICD-10-CM | POA: Diagnosis not present

## 2016-08-19 DIAGNOSIS — Z992 Dependence on renal dialysis: Secondary | ICD-10-CM | POA: Diagnosis not present

## 2016-08-19 DIAGNOSIS — N186 End stage renal disease: Secondary | ICD-10-CM | POA: Diagnosis not present

## 2016-08-19 DIAGNOSIS — N2581 Secondary hyperparathyroidism of renal origin: Secondary | ICD-10-CM | POA: Diagnosis not present

## 2016-08-22 DIAGNOSIS — D509 Iron deficiency anemia, unspecified: Secondary | ICD-10-CM | POA: Diagnosis not present

## 2016-08-22 DIAGNOSIS — N2581 Secondary hyperparathyroidism of renal origin: Secondary | ICD-10-CM | POA: Diagnosis not present

## 2016-08-22 DIAGNOSIS — N186 End stage renal disease: Secondary | ICD-10-CM | POA: Diagnosis not present

## 2016-08-22 DIAGNOSIS — Z992 Dependence on renal dialysis: Secondary | ICD-10-CM | POA: Diagnosis not present

## 2016-08-24 DIAGNOSIS — Z992 Dependence on renal dialysis: Secondary | ICD-10-CM | POA: Diagnosis not present

## 2016-08-24 DIAGNOSIS — D509 Iron deficiency anemia, unspecified: Secondary | ICD-10-CM | POA: Diagnosis not present

## 2016-08-24 DIAGNOSIS — N186 End stage renal disease: Secondary | ICD-10-CM | POA: Diagnosis not present

## 2016-08-24 DIAGNOSIS — N2581 Secondary hyperparathyroidism of renal origin: Secondary | ICD-10-CM | POA: Diagnosis not present

## 2016-08-26 DIAGNOSIS — N186 End stage renal disease: Secondary | ICD-10-CM | POA: Diagnosis not present

## 2016-08-26 DIAGNOSIS — N2581 Secondary hyperparathyroidism of renal origin: Secondary | ICD-10-CM | POA: Diagnosis not present

## 2016-08-26 DIAGNOSIS — D509 Iron deficiency anemia, unspecified: Secondary | ICD-10-CM | POA: Diagnosis not present

## 2016-08-26 DIAGNOSIS — Z992 Dependence on renal dialysis: Secondary | ICD-10-CM | POA: Diagnosis not present

## 2016-08-29 DIAGNOSIS — D509 Iron deficiency anemia, unspecified: Secondary | ICD-10-CM | POA: Diagnosis not present

## 2016-08-29 DIAGNOSIS — Z992 Dependence on renal dialysis: Secondary | ICD-10-CM | POA: Diagnosis not present

## 2016-08-29 DIAGNOSIS — N186 End stage renal disease: Secondary | ICD-10-CM | POA: Diagnosis not present

## 2016-08-29 DIAGNOSIS — N2581 Secondary hyperparathyroidism of renal origin: Secondary | ICD-10-CM | POA: Diagnosis not present

## 2016-08-31 DIAGNOSIS — D509 Iron deficiency anemia, unspecified: Secondary | ICD-10-CM | POA: Diagnosis not present

## 2016-08-31 DIAGNOSIS — N186 End stage renal disease: Secondary | ICD-10-CM | POA: Diagnosis not present

## 2016-08-31 DIAGNOSIS — Z992 Dependence on renal dialysis: Secondary | ICD-10-CM | POA: Diagnosis not present

## 2016-08-31 DIAGNOSIS — N2581 Secondary hyperparathyroidism of renal origin: Secondary | ICD-10-CM | POA: Diagnosis not present

## 2016-09-02 DIAGNOSIS — N2581 Secondary hyperparathyroidism of renal origin: Secondary | ICD-10-CM | POA: Diagnosis not present

## 2016-09-02 DIAGNOSIS — D509 Iron deficiency anemia, unspecified: Secondary | ICD-10-CM | POA: Diagnosis not present

## 2016-09-02 DIAGNOSIS — Z992 Dependence on renal dialysis: Secondary | ICD-10-CM | POA: Diagnosis not present

## 2016-09-02 DIAGNOSIS — N186 End stage renal disease: Secondary | ICD-10-CM | POA: Diagnosis not present

## 2016-09-05 DIAGNOSIS — Z992 Dependence on renal dialysis: Secondary | ICD-10-CM | POA: Diagnosis not present

## 2016-09-05 DIAGNOSIS — N2581 Secondary hyperparathyroidism of renal origin: Secondary | ICD-10-CM | POA: Diagnosis not present

## 2016-09-05 DIAGNOSIS — N186 End stage renal disease: Secondary | ICD-10-CM | POA: Diagnosis not present

## 2016-09-05 DIAGNOSIS — D509 Iron deficiency anemia, unspecified: Secondary | ICD-10-CM | POA: Diagnosis not present

## 2016-09-07 DIAGNOSIS — N186 End stage renal disease: Secondary | ICD-10-CM | POA: Diagnosis not present

## 2016-09-07 DIAGNOSIS — Z992 Dependence on renal dialysis: Secondary | ICD-10-CM | POA: Diagnosis not present

## 2016-09-07 DIAGNOSIS — N2581 Secondary hyperparathyroidism of renal origin: Secondary | ICD-10-CM | POA: Diagnosis not present

## 2016-09-07 DIAGNOSIS — D509 Iron deficiency anemia, unspecified: Secondary | ICD-10-CM | POA: Diagnosis not present

## 2016-09-09 DIAGNOSIS — N2581 Secondary hyperparathyroidism of renal origin: Secondary | ICD-10-CM | POA: Diagnosis not present

## 2016-09-09 DIAGNOSIS — Z992 Dependence on renal dialysis: Secondary | ICD-10-CM | POA: Diagnosis not present

## 2016-09-09 DIAGNOSIS — D509 Iron deficiency anemia, unspecified: Secondary | ICD-10-CM | POA: Diagnosis not present

## 2016-09-09 DIAGNOSIS — N186 End stage renal disease: Secondary | ICD-10-CM | POA: Diagnosis not present

## 2016-09-10 DIAGNOSIS — Z992 Dependence on renal dialysis: Secondary | ICD-10-CM | POA: Diagnosis not present

## 2016-09-10 DIAGNOSIS — N186 End stage renal disease: Secondary | ICD-10-CM | POA: Diagnosis not present

## 2016-09-11 DIAGNOSIS — Z992 Dependence on renal dialysis: Secondary | ICD-10-CM | POA: Diagnosis not present

## 2016-09-11 DIAGNOSIS — N186 End stage renal disease: Secondary | ICD-10-CM | POA: Diagnosis not present

## 2016-09-11 DIAGNOSIS — D509 Iron deficiency anemia, unspecified: Secondary | ICD-10-CM | POA: Diagnosis not present

## 2016-09-11 DIAGNOSIS — N2581 Secondary hyperparathyroidism of renal origin: Secondary | ICD-10-CM | POA: Diagnosis not present

## 2016-09-12 DIAGNOSIS — D509 Iron deficiency anemia, unspecified: Secondary | ICD-10-CM | POA: Diagnosis not present

## 2016-09-12 DIAGNOSIS — Z992 Dependence on renal dialysis: Secondary | ICD-10-CM | POA: Diagnosis not present

## 2016-09-12 DIAGNOSIS — N2581 Secondary hyperparathyroidism of renal origin: Secondary | ICD-10-CM | POA: Diagnosis not present

## 2016-09-12 DIAGNOSIS — N186 End stage renal disease: Secondary | ICD-10-CM | POA: Diagnosis not present

## 2016-09-13 DIAGNOSIS — B351 Tinea unguium: Secondary | ICD-10-CM | POA: Diagnosis not present

## 2016-09-13 DIAGNOSIS — E1142 Type 2 diabetes mellitus with diabetic polyneuropathy: Secondary | ICD-10-CM | POA: Diagnosis not present

## 2016-09-13 DIAGNOSIS — L851 Acquired keratosis [keratoderma] palmaris et plantaris: Secondary | ICD-10-CM | POA: Diagnosis not present

## 2016-09-14 DIAGNOSIS — Z992 Dependence on renal dialysis: Secondary | ICD-10-CM | POA: Diagnosis not present

## 2016-09-14 DIAGNOSIS — N186 End stage renal disease: Secondary | ICD-10-CM | POA: Diagnosis not present

## 2016-09-14 DIAGNOSIS — N2581 Secondary hyperparathyroidism of renal origin: Secondary | ICD-10-CM | POA: Diagnosis not present

## 2016-09-14 DIAGNOSIS — D509 Iron deficiency anemia, unspecified: Secondary | ICD-10-CM | POA: Diagnosis not present

## 2016-09-15 DIAGNOSIS — N2581 Secondary hyperparathyroidism of renal origin: Secondary | ICD-10-CM | POA: Diagnosis not present

## 2016-09-15 DIAGNOSIS — D509 Iron deficiency anemia, unspecified: Secondary | ICD-10-CM | POA: Diagnosis not present

## 2016-09-15 DIAGNOSIS — Z992 Dependence on renal dialysis: Secondary | ICD-10-CM | POA: Diagnosis not present

## 2016-09-15 DIAGNOSIS — N186 End stage renal disease: Secondary | ICD-10-CM | POA: Diagnosis not present

## 2016-09-16 DIAGNOSIS — Z992 Dependence on renal dialysis: Secondary | ICD-10-CM | POA: Diagnosis not present

## 2016-09-16 DIAGNOSIS — N186 End stage renal disease: Secondary | ICD-10-CM | POA: Diagnosis not present

## 2016-09-16 DIAGNOSIS — N2581 Secondary hyperparathyroidism of renal origin: Secondary | ICD-10-CM | POA: Diagnosis not present

## 2016-09-16 DIAGNOSIS — D509 Iron deficiency anemia, unspecified: Secondary | ICD-10-CM | POA: Diagnosis not present

## 2016-09-19 DIAGNOSIS — N186 End stage renal disease: Secondary | ICD-10-CM | POA: Diagnosis not present

## 2016-09-19 DIAGNOSIS — D509 Iron deficiency anemia, unspecified: Secondary | ICD-10-CM | POA: Diagnosis not present

## 2016-09-19 DIAGNOSIS — Z992 Dependence on renal dialysis: Secondary | ICD-10-CM | POA: Diagnosis not present

## 2016-09-19 DIAGNOSIS — N2581 Secondary hyperparathyroidism of renal origin: Secondary | ICD-10-CM | POA: Diagnosis not present

## 2016-09-21 DIAGNOSIS — N2581 Secondary hyperparathyroidism of renal origin: Secondary | ICD-10-CM | POA: Diagnosis not present

## 2016-09-21 DIAGNOSIS — D509 Iron deficiency anemia, unspecified: Secondary | ICD-10-CM | POA: Diagnosis not present

## 2016-09-21 DIAGNOSIS — N186 End stage renal disease: Secondary | ICD-10-CM | POA: Diagnosis not present

## 2016-09-21 DIAGNOSIS — Z992 Dependence on renal dialysis: Secondary | ICD-10-CM | POA: Diagnosis not present

## 2016-09-23 DIAGNOSIS — D509 Iron deficiency anemia, unspecified: Secondary | ICD-10-CM | POA: Diagnosis not present

## 2016-09-23 DIAGNOSIS — N186 End stage renal disease: Secondary | ICD-10-CM | POA: Diagnosis not present

## 2016-09-23 DIAGNOSIS — Z992 Dependence on renal dialysis: Secondary | ICD-10-CM | POA: Diagnosis not present

## 2016-09-23 DIAGNOSIS — N2581 Secondary hyperparathyroidism of renal origin: Secondary | ICD-10-CM | POA: Diagnosis not present

## 2016-09-26 DIAGNOSIS — Z992 Dependence on renal dialysis: Secondary | ICD-10-CM | POA: Diagnosis not present

## 2016-09-26 DIAGNOSIS — D509 Iron deficiency anemia, unspecified: Secondary | ICD-10-CM | POA: Diagnosis not present

## 2016-09-26 DIAGNOSIS — N2581 Secondary hyperparathyroidism of renal origin: Secondary | ICD-10-CM | POA: Diagnosis not present

## 2016-09-26 DIAGNOSIS — N186 End stage renal disease: Secondary | ICD-10-CM | POA: Diagnosis not present

## 2016-09-28 DIAGNOSIS — Z992 Dependence on renal dialysis: Secondary | ICD-10-CM | POA: Diagnosis not present

## 2016-09-28 DIAGNOSIS — N186 End stage renal disease: Secondary | ICD-10-CM | POA: Diagnosis not present

## 2016-09-28 DIAGNOSIS — N2581 Secondary hyperparathyroidism of renal origin: Secondary | ICD-10-CM | POA: Diagnosis not present

## 2016-09-28 DIAGNOSIS — D509 Iron deficiency anemia, unspecified: Secondary | ICD-10-CM | POA: Diagnosis not present

## 2016-09-30 DIAGNOSIS — D509 Iron deficiency anemia, unspecified: Secondary | ICD-10-CM | POA: Diagnosis not present

## 2016-09-30 DIAGNOSIS — N186 End stage renal disease: Secondary | ICD-10-CM | POA: Diagnosis not present

## 2016-09-30 DIAGNOSIS — Z992 Dependence on renal dialysis: Secondary | ICD-10-CM | POA: Diagnosis not present

## 2016-09-30 DIAGNOSIS — N2581 Secondary hyperparathyroidism of renal origin: Secondary | ICD-10-CM | POA: Diagnosis not present

## 2016-10-03 DIAGNOSIS — N2581 Secondary hyperparathyroidism of renal origin: Secondary | ICD-10-CM | POA: Diagnosis not present

## 2016-10-03 DIAGNOSIS — N186 End stage renal disease: Secondary | ICD-10-CM | POA: Diagnosis not present

## 2016-10-03 DIAGNOSIS — Z992 Dependence on renal dialysis: Secondary | ICD-10-CM | POA: Diagnosis not present

## 2016-10-03 DIAGNOSIS — D509 Iron deficiency anemia, unspecified: Secondary | ICD-10-CM | POA: Diagnosis not present

## 2016-10-05 DIAGNOSIS — Z992 Dependence on renal dialysis: Secondary | ICD-10-CM | POA: Diagnosis not present

## 2016-10-05 DIAGNOSIS — N2581 Secondary hyperparathyroidism of renal origin: Secondary | ICD-10-CM | POA: Diagnosis not present

## 2016-10-05 DIAGNOSIS — N186 End stage renal disease: Secondary | ICD-10-CM | POA: Diagnosis not present

## 2016-10-05 DIAGNOSIS — D509 Iron deficiency anemia, unspecified: Secondary | ICD-10-CM | POA: Diagnosis not present

## 2016-10-07 DIAGNOSIS — N186 End stage renal disease: Secondary | ICD-10-CM | POA: Diagnosis not present

## 2016-10-07 DIAGNOSIS — Z992 Dependence on renal dialysis: Secondary | ICD-10-CM | POA: Diagnosis not present

## 2016-10-07 DIAGNOSIS — N2581 Secondary hyperparathyroidism of renal origin: Secondary | ICD-10-CM | POA: Diagnosis not present

## 2016-10-07 DIAGNOSIS — D509 Iron deficiency anemia, unspecified: Secondary | ICD-10-CM | POA: Diagnosis not present

## 2016-10-10 DIAGNOSIS — D509 Iron deficiency anemia, unspecified: Secondary | ICD-10-CM | POA: Diagnosis not present

## 2016-10-10 DIAGNOSIS — N2581 Secondary hyperparathyroidism of renal origin: Secondary | ICD-10-CM | POA: Diagnosis not present

## 2016-10-10 DIAGNOSIS — N186 End stage renal disease: Secondary | ICD-10-CM | POA: Diagnosis not present

## 2016-10-10 DIAGNOSIS — Z992 Dependence on renal dialysis: Secondary | ICD-10-CM | POA: Diagnosis not present

## 2016-10-11 DIAGNOSIS — Z992 Dependence on renal dialysis: Secondary | ICD-10-CM | POA: Diagnosis not present

## 2016-10-11 DIAGNOSIS — N186 End stage renal disease: Secondary | ICD-10-CM | POA: Diagnosis not present

## 2016-10-12 DIAGNOSIS — N2581 Secondary hyperparathyroidism of renal origin: Secondary | ICD-10-CM | POA: Diagnosis not present

## 2016-10-12 DIAGNOSIS — Z992 Dependence on renal dialysis: Secondary | ICD-10-CM | POA: Diagnosis not present

## 2016-10-12 DIAGNOSIS — N186 End stage renal disease: Secondary | ICD-10-CM | POA: Diagnosis not present

## 2016-10-12 DIAGNOSIS — D509 Iron deficiency anemia, unspecified: Secondary | ICD-10-CM | POA: Diagnosis not present

## 2016-10-14 DIAGNOSIS — N2581 Secondary hyperparathyroidism of renal origin: Secondary | ICD-10-CM | POA: Diagnosis not present

## 2016-10-14 DIAGNOSIS — N186 End stage renal disease: Secondary | ICD-10-CM | POA: Diagnosis not present

## 2016-10-14 DIAGNOSIS — D509 Iron deficiency anemia, unspecified: Secondary | ICD-10-CM | POA: Diagnosis not present

## 2016-10-14 DIAGNOSIS — Z992 Dependence on renal dialysis: Secondary | ICD-10-CM | POA: Diagnosis not present

## 2016-10-15 DIAGNOSIS — N186 End stage renal disease: Secondary | ICD-10-CM | POA: Diagnosis not present

## 2016-10-15 DIAGNOSIS — D509 Iron deficiency anemia, unspecified: Secondary | ICD-10-CM | POA: Diagnosis not present

## 2016-10-15 DIAGNOSIS — N2581 Secondary hyperparathyroidism of renal origin: Secondary | ICD-10-CM | POA: Diagnosis not present

## 2016-10-15 DIAGNOSIS — Z992 Dependence on renal dialysis: Secondary | ICD-10-CM | POA: Diagnosis not present

## 2016-10-17 DIAGNOSIS — D509 Iron deficiency anemia, unspecified: Secondary | ICD-10-CM | POA: Diagnosis not present

## 2016-10-17 DIAGNOSIS — N2581 Secondary hyperparathyroidism of renal origin: Secondary | ICD-10-CM | POA: Diagnosis not present

## 2016-10-17 DIAGNOSIS — N186 End stage renal disease: Secondary | ICD-10-CM | POA: Diagnosis not present

## 2016-10-17 DIAGNOSIS — Z992 Dependence on renal dialysis: Secondary | ICD-10-CM | POA: Diagnosis not present

## 2016-10-19 DIAGNOSIS — Z992 Dependence on renal dialysis: Secondary | ICD-10-CM | POA: Diagnosis not present

## 2016-10-19 DIAGNOSIS — D509 Iron deficiency anemia, unspecified: Secondary | ICD-10-CM | POA: Diagnosis not present

## 2016-10-19 DIAGNOSIS — N2581 Secondary hyperparathyroidism of renal origin: Secondary | ICD-10-CM | POA: Diagnosis not present

## 2016-10-19 DIAGNOSIS — N186 End stage renal disease: Secondary | ICD-10-CM | POA: Diagnosis not present

## 2016-10-21 DIAGNOSIS — N2581 Secondary hyperparathyroidism of renal origin: Secondary | ICD-10-CM | POA: Diagnosis not present

## 2016-10-21 DIAGNOSIS — Z992 Dependence on renal dialysis: Secondary | ICD-10-CM | POA: Diagnosis not present

## 2016-10-21 DIAGNOSIS — N186 End stage renal disease: Secondary | ICD-10-CM | POA: Diagnosis not present

## 2016-10-21 DIAGNOSIS — D509 Iron deficiency anemia, unspecified: Secondary | ICD-10-CM | POA: Diagnosis not present

## 2016-10-24 DIAGNOSIS — N2581 Secondary hyperparathyroidism of renal origin: Secondary | ICD-10-CM | POA: Diagnosis not present

## 2016-10-24 DIAGNOSIS — D509 Iron deficiency anemia, unspecified: Secondary | ICD-10-CM | POA: Diagnosis not present

## 2016-10-24 DIAGNOSIS — N186 End stage renal disease: Secondary | ICD-10-CM | POA: Diagnosis not present

## 2016-10-24 DIAGNOSIS — Z992 Dependence on renal dialysis: Secondary | ICD-10-CM | POA: Diagnosis not present

## 2016-10-26 DIAGNOSIS — N2581 Secondary hyperparathyroidism of renal origin: Secondary | ICD-10-CM | POA: Diagnosis not present

## 2016-10-26 DIAGNOSIS — N186 End stage renal disease: Secondary | ICD-10-CM | POA: Diagnosis not present

## 2016-10-26 DIAGNOSIS — D509 Iron deficiency anemia, unspecified: Secondary | ICD-10-CM | POA: Diagnosis not present

## 2016-10-26 DIAGNOSIS — Z992 Dependence on renal dialysis: Secondary | ICD-10-CM | POA: Diagnosis not present

## 2016-10-28 DIAGNOSIS — D509 Iron deficiency anemia, unspecified: Secondary | ICD-10-CM | POA: Diagnosis not present

## 2016-10-28 DIAGNOSIS — Z992 Dependence on renal dialysis: Secondary | ICD-10-CM | POA: Diagnosis not present

## 2016-10-28 DIAGNOSIS — N186 End stage renal disease: Secondary | ICD-10-CM | POA: Diagnosis not present

## 2016-10-28 DIAGNOSIS — N2581 Secondary hyperparathyroidism of renal origin: Secondary | ICD-10-CM | POA: Diagnosis not present

## 2016-10-31 DIAGNOSIS — D509 Iron deficiency anemia, unspecified: Secondary | ICD-10-CM | POA: Diagnosis not present

## 2016-10-31 DIAGNOSIS — N2581 Secondary hyperparathyroidism of renal origin: Secondary | ICD-10-CM | POA: Diagnosis not present

## 2016-10-31 DIAGNOSIS — Z992 Dependence on renal dialysis: Secondary | ICD-10-CM | POA: Diagnosis not present

## 2016-10-31 DIAGNOSIS — N186 End stage renal disease: Secondary | ICD-10-CM | POA: Diagnosis not present

## 2016-11-02 DIAGNOSIS — N2581 Secondary hyperparathyroidism of renal origin: Secondary | ICD-10-CM | POA: Diagnosis not present

## 2016-11-02 DIAGNOSIS — Z992 Dependence on renal dialysis: Secondary | ICD-10-CM | POA: Diagnosis not present

## 2016-11-02 DIAGNOSIS — N186 End stage renal disease: Secondary | ICD-10-CM | POA: Diagnosis not present

## 2016-11-02 DIAGNOSIS — D509 Iron deficiency anemia, unspecified: Secondary | ICD-10-CM | POA: Diagnosis not present

## 2016-11-04 DIAGNOSIS — D509 Iron deficiency anemia, unspecified: Secondary | ICD-10-CM | POA: Diagnosis not present

## 2016-11-04 DIAGNOSIS — N2581 Secondary hyperparathyroidism of renal origin: Secondary | ICD-10-CM | POA: Diagnosis not present

## 2016-11-04 DIAGNOSIS — N186 End stage renal disease: Secondary | ICD-10-CM | POA: Diagnosis not present

## 2016-11-04 DIAGNOSIS — Z992 Dependence on renal dialysis: Secondary | ICD-10-CM | POA: Diagnosis not present

## 2016-11-07 DIAGNOSIS — Z992 Dependence on renal dialysis: Secondary | ICD-10-CM | POA: Diagnosis not present

## 2016-11-07 DIAGNOSIS — D509 Iron deficiency anemia, unspecified: Secondary | ICD-10-CM | POA: Diagnosis not present

## 2016-11-07 DIAGNOSIS — N2581 Secondary hyperparathyroidism of renal origin: Secondary | ICD-10-CM | POA: Diagnosis not present

## 2016-11-07 DIAGNOSIS — N186 End stage renal disease: Secondary | ICD-10-CM | POA: Diagnosis not present

## 2016-11-09 DIAGNOSIS — Z992 Dependence on renal dialysis: Secondary | ICD-10-CM | POA: Diagnosis not present

## 2016-11-09 DIAGNOSIS — N186 End stage renal disease: Secondary | ICD-10-CM | POA: Diagnosis not present

## 2016-11-09 DIAGNOSIS — D509 Iron deficiency anemia, unspecified: Secondary | ICD-10-CM | POA: Diagnosis not present

## 2016-11-09 DIAGNOSIS — N2581 Secondary hyperparathyroidism of renal origin: Secondary | ICD-10-CM | POA: Diagnosis not present

## 2016-11-10 DIAGNOSIS — Z0000 Encounter for general adult medical examination without abnormal findings: Secondary | ICD-10-CM | POA: Diagnosis not present

## 2016-11-11 ENCOUNTER — Other Ambulatory Visit (HOSPITAL_COMMUNITY): Payer: Self-pay | Admitting: Pulmonary Disease

## 2016-11-11 DIAGNOSIS — D509 Iron deficiency anemia, unspecified: Secondary | ICD-10-CM | POA: Diagnosis not present

## 2016-11-11 DIAGNOSIS — N186 End stage renal disease: Secondary | ICD-10-CM | POA: Diagnosis not present

## 2016-11-11 DIAGNOSIS — Z992 Dependence on renal dialysis: Secondary | ICD-10-CM | POA: Diagnosis not present

## 2016-11-11 DIAGNOSIS — N2581 Secondary hyperparathyroidism of renal origin: Secondary | ICD-10-CM | POA: Diagnosis not present

## 2016-11-11 DIAGNOSIS — R011 Cardiac murmur, unspecified: Secondary | ICD-10-CM

## 2016-11-12 DIAGNOSIS — N2581 Secondary hyperparathyroidism of renal origin: Secondary | ICD-10-CM | POA: Diagnosis not present

## 2016-11-12 DIAGNOSIS — Z992 Dependence on renal dialysis: Secondary | ICD-10-CM | POA: Diagnosis not present

## 2016-11-12 DIAGNOSIS — N186 End stage renal disease: Secondary | ICD-10-CM | POA: Diagnosis not present

## 2016-11-12 DIAGNOSIS — D509 Iron deficiency anemia, unspecified: Secondary | ICD-10-CM | POA: Diagnosis not present

## 2016-11-14 DIAGNOSIS — D509 Iron deficiency anemia, unspecified: Secondary | ICD-10-CM | POA: Diagnosis not present

## 2016-11-14 DIAGNOSIS — Z992 Dependence on renal dialysis: Secondary | ICD-10-CM | POA: Diagnosis not present

## 2016-11-14 DIAGNOSIS — N2581 Secondary hyperparathyroidism of renal origin: Secondary | ICD-10-CM | POA: Diagnosis not present

## 2016-11-14 DIAGNOSIS — N186 End stage renal disease: Secondary | ICD-10-CM | POA: Diagnosis not present

## 2016-11-15 DIAGNOSIS — H401131 Primary open-angle glaucoma, bilateral, mild stage: Secondary | ICD-10-CM | POA: Diagnosis not present

## 2016-11-16 ENCOUNTER — Other Ambulatory Visit (HOSPITAL_COMMUNITY): Payer: Medicare Other

## 2016-11-16 DIAGNOSIS — Z992 Dependence on renal dialysis: Secondary | ICD-10-CM | POA: Diagnosis not present

## 2016-11-16 DIAGNOSIS — N2581 Secondary hyperparathyroidism of renal origin: Secondary | ICD-10-CM | POA: Diagnosis not present

## 2016-11-16 DIAGNOSIS — N186 End stage renal disease: Secondary | ICD-10-CM | POA: Diagnosis not present

## 2016-11-16 DIAGNOSIS — D509 Iron deficiency anemia, unspecified: Secondary | ICD-10-CM | POA: Diagnosis not present

## 2016-11-17 ENCOUNTER — Ambulatory Visit (HOSPITAL_COMMUNITY)
Admission: RE | Admit: 2016-11-17 | Discharge: 2016-11-17 | Disposition: A | Discharge status: Home / Self Care | Payer: Medicare Other | Source: Ambulatory Visit | Attending: Pulmonary Disease | Admitting: Pulmonary Disease

## 2016-11-17 DIAGNOSIS — R011 Cardiac murmur, unspecified: Secondary | ICD-10-CM | POA: Diagnosis not present

## 2016-11-17 DIAGNOSIS — I350 Nonrheumatic aortic (valve) stenosis: Secondary | ICD-10-CM | POA: Insufficient documentation

## 2016-11-17 DIAGNOSIS — Z87891 Personal history of nicotine dependence: Secondary | ICD-10-CM | POA: Insufficient documentation

## 2016-11-17 DIAGNOSIS — R1310 Dysphagia, unspecified: Secondary | ICD-10-CM | POA: Diagnosis not present

## 2016-11-17 NOTE — Progress Notes (Signed)
*  PRELIMINARY RESULTS* Echocardiogram 2D Echocardiogram has been performed.  Gregory Rangel 11/17/2016, 1:01 PM

## 2016-11-18 DIAGNOSIS — D509 Iron deficiency anemia, unspecified: Secondary | ICD-10-CM | POA: Diagnosis not present

## 2016-11-18 DIAGNOSIS — N2581 Secondary hyperparathyroidism of renal origin: Secondary | ICD-10-CM | POA: Diagnosis not present

## 2016-11-18 DIAGNOSIS — N186 End stage renal disease: Secondary | ICD-10-CM | POA: Diagnosis not present

## 2016-11-18 DIAGNOSIS — Z992 Dependence on renal dialysis: Secondary | ICD-10-CM | POA: Diagnosis not present

## 2016-11-21 DIAGNOSIS — N186 End stage renal disease: Secondary | ICD-10-CM | POA: Diagnosis not present

## 2016-11-21 DIAGNOSIS — N2581 Secondary hyperparathyroidism of renal origin: Secondary | ICD-10-CM | POA: Diagnosis not present

## 2016-11-21 DIAGNOSIS — D509 Iron deficiency anemia, unspecified: Secondary | ICD-10-CM | POA: Diagnosis not present

## 2016-11-21 DIAGNOSIS — Z992 Dependence on renal dialysis: Secondary | ICD-10-CM | POA: Diagnosis not present

## 2016-11-23 DIAGNOSIS — Z992 Dependence on renal dialysis: Secondary | ICD-10-CM | POA: Diagnosis not present

## 2016-11-23 DIAGNOSIS — N2581 Secondary hyperparathyroidism of renal origin: Secondary | ICD-10-CM | POA: Diagnosis not present

## 2016-11-23 DIAGNOSIS — D509 Iron deficiency anemia, unspecified: Secondary | ICD-10-CM | POA: Diagnosis not present

## 2016-11-23 DIAGNOSIS — N186 End stage renal disease: Secondary | ICD-10-CM | POA: Diagnosis not present

## 2016-11-25 DIAGNOSIS — N2581 Secondary hyperparathyroidism of renal origin: Secondary | ICD-10-CM | POA: Diagnosis not present

## 2016-11-25 DIAGNOSIS — D509 Iron deficiency anemia, unspecified: Secondary | ICD-10-CM | POA: Diagnosis not present

## 2016-11-25 DIAGNOSIS — Z992 Dependence on renal dialysis: Secondary | ICD-10-CM | POA: Diagnosis not present

## 2016-11-25 DIAGNOSIS — N186 End stage renal disease: Secondary | ICD-10-CM | POA: Diagnosis not present

## 2016-11-28 DIAGNOSIS — N2581 Secondary hyperparathyroidism of renal origin: Secondary | ICD-10-CM | POA: Diagnosis not present

## 2016-11-28 DIAGNOSIS — Z992 Dependence on renal dialysis: Secondary | ICD-10-CM | POA: Diagnosis not present

## 2016-11-28 DIAGNOSIS — D509 Iron deficiency anemia, unspecified: Secondary | ICD-10-CM | POA: Diagnosis not present

## 2016-11-28 DIAGNOSIS — N186 End stage renal disease: Secondary | ICD-10-CM | POA: Diagnosis not present

## 2016-11-29 DIAGNOSIS — L851 Acquired keratosis [keratoderma] palmaris et plantaris: Secondary | ICD-10-CM | POA: Diagnosis not present

## 2016-11-29 DIAGNOSIS — B351 Tinea unguium: Secondary | ICD-10-CM | POA: Diagnosis not present

## 2016-11-29 DIAGNOSIS — E1142 Type 2 diabetes mellitus with diabetic polyneuropathy: Secondary | ICD-10-CM | POA: Diagnosis not present

## 2016-11-30 DIAGNOSIS — N186 End stage renal disease: Secondary | ICD-10-CM | POA: Diagnosis not present

## 2016-11-30 DIAGNOSIS — D509 Iron deficiency anemia, unspecified: Secondary | ICD-10-CM | POA: Diagnosis not present

## 2016-11-30 DIAGNOSIS — N2581 Secondary hyperparathyroidism of renal origin: Secondary | ICD-10-CM | POA: Diagnosis not present

## 2016-11-30 DIAGNOSIS — Z992 Dependence on renal dialysis: Secondary | ICD-10-CM | POA: Diagnosis not present

## 2016-12-02 DIAGNOSIS — Z992 Dependence on renal dialysis: Secondary | ICD-10-CM | POA: Diagnosis not present

## 2016-12-02 DIAGNOSIS — D509 Iron deficiency anemia, unspecified: Secondary | ICD-10-CM | POA: Diagnosis not present

## 2016-12-02 DIAGNOSIS — N186 End stage renal disease: Secondary | ICD-10-CM | POA: Diagnosis not present

## 2016-12-02 DIAGNOSIS — N2581 Secondary hyperparathyroidism of renal origin: Secondary | ICD-10-CM | POA: Diagnosis not present

## 2016-12-05 DIAGNOSIS — Z992 Dependence on renal dialysis: Secondary | ICD-10-CM | POA: Diagnosis not present

## 2016-12-05 DIAGNOSIS — D509 Iron deficiency anemia, unspecified: Secondary | ICD-10-CM | POA: Diagnosis not present

## 2016-12-05 DIAGNOSIS — N2581 Secondary hyperparathyroidism of renal origin: Secondary | ICD-10-CM | POA: Diagnosis not present

## 2016-12-05 DIAGNOSIS — N186 End stage renal disease: Secondary | ICD-10-CM | POA: Diagnosis not present

## 2016-12-07 DIAGNOSIS — Z992 Dependence on renal dialysis: Secondary | ICD-10-CM | POA: Diagnosis not present

## 2016-12-07 DIAGNOSIS — N186 End stage renal disease: Secondary | ICD-10-CM | POA: Diagnosis not present

## 2016-12-07 DIAGNOSIS — D509 Iron deficiency anemia, unspecified: Secondary | ICD-10-CM | POA: Diagnosis not present

## 2016-12-07 DIAGNOSIS — N2581 Secondary hyperparathyroidism of renal origin: Secondary | ICD-10-CM | POA: Diagnosis not present

## 2016-12-09 ENCOUNTER — Encounter (INDEPENDENT_AMBULATORY_CARE_PROVIDER_SITE_OTHER): Payer: Self-pay

## 2016-12-09 ENCOUNTER — Encounter (INDEPENDENT_AMBULATORY_CARE_PROVIDER_SITE_OTHER): Payer: Self-pay | Admitting: Internal Medicine

## 2016-12-09 DIAGNOSIS — N186 End stage renal disease: Secondary | ICD-10-CM | POA: Diagnosis not present

## 2016-12-09 DIAGNOSIS — D509 Iron deficiency anemia, unspecified: Secondary | ICD-10-CM | POA: Diagnosis not present

## 2016-12-09 DIAGNOSIS — N2581 Secondary hyperparathyroidism of renal origin: Secondary | ICD-10-CM | POA: Diagnosis not present

## 2016-12-09 DIAGNOSIS — Z992 Dependence on renal dialysis: Secondary | ICD-10-CM | POA: Diagnosis not present

## 2016-12-11 DIAGNOSIS — Z992 Dependence on renal dialysis: Secondary | ICD-10-CM | POA: Diagnosis not present

## 2016-12-11 DIAGNOSIS — N186 End stage renal disease: Secondary | ICD-10-CM | POA: Diagnosis not present

## 2016-12-12 DIAGNOSIS — N186 End stage renal disease: Secondary | ICD-10-CM | POA: Diagnosis not present

## 2016-12-12 DIAGNOSIS — Z992 Dependence on renal dialysis: Secondary | ICD-10-CM | POA: Diagnosis not present

## 2016-12-12 DIAGNOSIS — N2581 Secondary hyperparathyroidism of renal origin: Secondary | ICD-10-CM | POA: Diagnosis not present

## 2016-12-12 DIAGNOSIS — Z23 Encounter for immunization: Secondary | ICD-10-CM | POA: Diagnosis not present

## 2016-12-12 DIAGNOSIS — D509 Iron deficiency anemia, unspecified: Secondary | ICD-10-CM | POA: Diagnosis not present

## 2016-12-14 DIAGNOSIS — D509 Iron deficiency anemia, unspecified: Secondary | ICD-10-CM | POA: Diagnosis not present

## 2016-12-14 DIAGNOSIS — Z23 Encounter for immunization: Secondary | ICD-10-CM | POA: Diagnosis not present

## 2016-12-14 DIAGNOSIS — N186 End stage renal disease: Secondary | ICD-10-CM | POA: Diagnosis not present

## 2016-12-14 DIAGNOSIS — Z992 Dependence on renal dialysis: Secondary | ICD-10-CM | POA: Diagnosis not present

## 2016-12-14 DIAGNOSIS — N2581 Secondary hyperparathyroidism of renal origin: Secondary | ICD-10-CM | POA: Diagnosis not present

## 2016-12-16 DIAGNOSIS — Z23 Encounter for immunization: Secondary | ICD-10-CM | POA: Diagnosis not present

## 2016-12-16 DIAGNOSIS — D509 Iron deficiency anemia, unspecified: Secondary | ICD-10-CM | POA: Diagnosis not present

## 2016-12-16 DIAGNOSIS — N186 End stage renal disease: Secondary | ICD-10-CM | POA: Diagnosis not present

## 2016-12-16 DIAGNOSIS — N2581 Secondary hyperparathyroidism of renal origin: Secondary | ICD-10-CM | POA: Diagnosis not present

## 2016-12-16 DIAGNOSIS — Z992 Dependence on renal dialysis: Secondary | ICD-10-CM | POA: Diagnosis not present

## 2016-12-19 DIAGNOSIS — N2581 Secondary hyperparathyroidism of renal origin: Secondary | ICD-10-CM | POA: Diagnosis not present

## 2016-12-19 DIAGNOSIS — Z992 Dependence on renal dialysis: Secondary | ICD-10-CM | POA: Diagnosis not present

## 2016-12-19 DIAGNOSIS — Z23 Encounter for immunization: Secondary | ICD-10-CM | POA: Diagnosis not present

## 2016-12-19 DIAGNOSIS — N186 End stage renal disease: Secondary | ICD-10-CM | POA: Diagnosis not present

## 2016-12-19 DIAGNOSIS — E119 Type 2 diabetes mellitus without complications: Secondary | ICD-10-CM | POA: Diagnosis not present

## 2016-12-19 DIAGNOSIS — D509 Iron deficiency anemia, unspecified: Secondary | ICD-10-CM | POA: Diagnosis not present

## 2016-12-21 DIAGNOSIS — Z23 Encounter for immunization: Secondary | ICD-10-CM | POA: Diagnosis not present

## 2016-12-21 DIAGNOSIS — Z992 Dependence on renal dialysis: Secondary | ICD-10-CM | POA: Diagnosis not present

## 2016-12-21 DIAGNOSIS — N186 End stage renal disease: Secondary | ICD-10-CM | POA: Diagnosis not present

## 2016-12-21 DIAGNOSIS — N2581 Secondary hyperparathyroidism of renal origin: Secondary | ICD-10-CM | POA: Diagnosis not present

## 2016-12-21 DIAGNOSIS — D509 Iron deficiency anemia, unspecified: Secondary | ICD-10-CM | POA: Diagnosis not present

## 2016-12-24 DIAGNOSIS — N2581 Secondary hyperparathyroidism of renal origin: Secondary | ICD-10-CM | POA: Diagnosis not present

## 2016-12-24 DIAGNOSIS — Z23 Encounter for immunization: Secondary | ICD-10-CM | POA: Diagnosis not present

## 2016-12-24 DIAGNOSIS — D509 Iron deficiency anemia, unspecified: Secondary | ICD-10-CM | POA: Diagnosis not present

## 2016-12-24 DIAGNOSIS — Z992 Dependence on renal dialysis: Secondary | ICD-10-CM | POA: Diagnosis not present

## 2016-12-24 DIAGNOSIS — N186 End stage renal disease: Secondary | ICD-10-CM | POA: Diagnosis not present

## 2016-12-26 DIAGNOSIS — Z23 Encounter for immunization: Secondary | ICD-10-CM | POA: Diagnosis not present

## 2016-12-26 DIAGNOSIS — D509 Iron deficiency anemia, unspecified: Secondary | ICD-10-CM | POA: Diagnosis not present

## 2016-12-26 DIAGNOSIS — N2581 Secondary hyperparathyroidism of renal origin: Secondary | ICD-10-CM | POA: Diagnosis not present

## 2016-12-26 DIAGNOSIS — Z992 Dependence on renal dialysis: Secondary | ICD-10-CM | POA: Diagnosis not present

## 2016-12-26 DIAGNOSIS — N186 End stage renal disease: Secondary | ICD-10-CM | POA: Diagnosis not present

## 2016-12-28 DIAGNOSIS — Z23 Encounter for immunization: Secondary | ICD-10-CM | POA: Diagnosis not present

## 2016-12-28 DIAGNOSIS — N2581 Secondary hyperparathyroidism of renal origin: Secondary | ICD-10-CM | POA: Diagnosis not present

## 2016-12-28 DIAGNOSIS — N186 End stage renal disease: Secondary | ICD-10-CM | POA: Diagnosis not present

## 2016-12-28 DIAGNOSIS — D509 Iron deficiency anemia, unspecified: Secondary | ICD-10-CM | POA: Diagnosis not present

## 2016-12-28 DIAGNOSIS — Z992 Dependence on renal dialysis: Secondary | ICD-10-CM | POA: Diagnosis not present

## 2016-12-30 DIAGNOSIS — D509 Iron deficiency anemia, unspecified: Secondary | ICD-10-CM | POA: Diagnosis not present

## 2016-12-30 DIAGNOSIS — Z992 Dependence on renal dialysis: Secondary | ICD-10-CM | POA: Diagnosis not present

## 2016-12-30 DIAGNOSIS — Z23 Encounter for immunization: Secondary | ICD-10-CM | POA: Diagnosis not present

## 2016-12-30 DIAGNOSIS — N186 End stage renal disease: Secondary | ICD-10-CM | POA: Diagnosis not present

## 2016-12-30 DIAGNOSIS — N2581 Secondary hyperparathyroidism of renal origin: Secondary | ICD-10-CM | POA: Diagnosis not present

## 2017-01-02 DIAGNOSIS — D509 Iron deficiency anemia, unspecified: Secondary | ICD-10-CM | POA: Diagnosis not present

## 2017-01-02 DIAGNOSIS — Z23 Encounter for immunization: Secondary | ICD-10-CM | POA: Diagnosis not present

## 2017-01-02 DIAGNOSIS — N2581 Secondary hyperparathyroidism of renal origin: Secondary | ICD-10-CM | POA: Diagnosis not present

## 2017-01-02 DIAGNOSIS — Z992 Dependence on renal dialysis: Secondary | ICD-10-CM | POA: Diagnosis not present

## 2017-01-02 DIAGNOSIS — N186 End stage renal disease: Secondary | ICD-10-CM | POA: Diagnosis not present

## 2017-01-04 DIAGNOSIS — N186 End stage renal disease: Secondary | ICD-10-CM | POA: Diagnosis not present

## 2017-01-04 DIAGNOSIS — Z992 Dependence on renal dialysis: Secondary | ICD-10-CM | POA: Diagnosis not present

## 2017-01-04 DIAGNOSIS — N2581 Secondary hyperparathyroidism of renal origin: Secondary | ICD-10-CM | POA: Diagnosis not present

## 2017-01-04 DIAGNOSIS — D509 Iron deficiency anemia, unspecified: Secondary | ICD-10-CM | POA: Diagnosis not present

## 2017-01-04 DIAGNOSIS — Z23 Encounter for immunization: Secondary | ICD-10-CM | POA: Diagnosis not present

## 2017-01-06 DIAGNOSIS — N186 End stage renal disease: Secondary | ICD-10-CM | POA: Diagnosis not present

## 2017-01-06 DIAGNOSIS — Z992 Dependence on renal dialysis: Secondary | ICD-10-CM | POA: Diagnosis not present

## 2017-01-06 DIAGNOSIS — Z23 Encounter for immunization: Secondary | ICD-10-CM | POA: Diagnosis not present

## 2017-01-06 DIAGNOSIS — D509 Iron deficiency anemia, unspecified: Secondary | ICD-10-CM | POA: Diagnosis not present

## 2017-01-06 DIAGNOSIS — N2581 Secondary hyperparathyroidism of renal origin: Secondary | ICD-10-CM | POA: Diagnosis not present

## 2017-01-09 DIAGNOSIS — Z992 Dependence on renal dialysis: Secondary | ICD-10-CM | POA: Diagnosis not present

## 2017-01-09 DIAGNOSIS — D509 Iron deficiency anemia, unspecified: Secondary | ICD-10-CM | POA: Diagnosis not present

## 2017-01-09 DIAGNOSIS — Z23 Encounter for immunization: Secondary | ICD-10-CM | POA: Diagnosis not present

## 2017-01-09 DIAGNOSIS — N2581 Secondary hyperparathyroidism of renal origin: Secondary | ICD-10-CM | POA: Diagnosis not present

## 2017-01-09 DIAGNOSIS — N186 End stage renal disease: Secondary | ICD-10-CM | POA: Diagnosis not present

## 2017-01-11 DIAGNOSIS — Z992 Dependence on renal dialysis: Secondary | ICD-10-CM | POA: Diagnosis not present

## 2017-01-11 DIAGNOSIS — N2581 Secondary hyperparathyroidism of renal origin: Secondary | ICD-10-CM | POA: Diagnosis not present

## 2017-01-11 DIAGNOSIS — D509 Iron deficiency anemia, unspecified: Secondary | ICD-10-CM | POA: Diagnosis not present

## 2017-01-11 DIAGNOSIS — Z23 Encounter for immunization: Secondary | ICD-10-CM | POA: Diagnosis not present

## 2017-01-11 DIAGNOSIS — N186 End stage renal disease: Secondary | ICD-10-CM | POA: Diagnosis not present

## 2017-01-12 DIAGNOSIS — Z992 Dependence on renal dialysis: Secondary | ICD-10-CM | POA: Diagnosis not present

## 2017-01-12 DIAGNOSIS — N2581 Secondary hyperparathyroidism of renal origin: Secondary | ICD-10-CM | POA: Diagnosis not present

## 2017-01-12 DIAGNOSIS — N186 End stage renal disease: Secondary | ICD-10-CM | POA: Diagnosis not present

## 2017-01-12 DIAGNOSIS — D509 Iron deficiency anemia, unspecified: Secondary | ICD-10-CM | POA: Diagnosis not present

## 2017-01-13 DIAGNOSIS — D509 Iron deficiency anemia, unspecified: Secondary | ICD-10-CM | POA: Diagnosis not present

## 2017-01-13 DIAGNOSIS — N2581 Secondary hyperparathyroidism of renal origin: Secondary | ICD-10-CM | POA: Diagnosis not present

## 2017-01-13 DIAGNOSIS — Z992 Dependence on renal dialysis: Secondary | ICD-10-CM | POA: Diagnosis not present

## 2017-01-13 DIAGNOSIS — N186 End stage renal disease: Secondary | ICD-10-CM | POA: Diagnosis not present

## 2017-01-16 DIAGNOSIS — N2581 Secondary hyperparathyroidism of renal origin: Secondary | ICD-10-CM | POA: Diagnosis not present

## 2017-01-16 DIAGNOSIS — N186 End stage renal disease: Secondary | ICD-10-CM | POA: Diagnosis not present

## 2017-01-16 DIAGNOSIS — D509 Iron deficiency anemia, unspecified: Secondary | ICD-10-CM | POA: Diagnosis not present

## 2017-01-16 DIAGNOSIS — Z992 Dependence on renal dialysis: Secondary | ICD-10-CM | POA: Diagnosis not present

## 2017-01-18 DIAGNOSIS — D509 Iron deficiency anemia, unspecified: Secondary | ICD-10-CM | POA: Diagnosis not present

## 2017-01-18 DIAGNOSIS — N2581 Secondary hyperparathyroidism of renal origin: Secondary | ICD-10-CM | POA: Diagnosis not present

## 2017-01-18 DIAGNOSIS — N186 End stage renal disease: Secondary | ICD-10-CM | POA: Diagnosis not present

## 2017-01-18 DIAGNOSIS — Z992 Dependence on renal dialysis: Secondary | ICD-10-CM | POA: Diagnosis not present

## 2017-01-20 DIAGNOSIS — Z992 Dependence on renal dialysis: Secondary | ICD-10-CM | POA: Diagnosis not present

## 2017-01-20 DIAGNOSIS — D509 Iron deficiency anemia, unspecified: Secondary | ICD-10-CM | POA: Diagnosis not present

## 2017-01-20 DIAGNOSIS — N2581 Secondary hyperparathyroidism of renal origin: Secondary | ICD-10-CM | POA: Diagnosis not present

## 2017-01-20 DIAGNOSIS — N186 End stage renal disease: Secondary | ICD-10-CM | POA: Diagnosis not present

## 2017-01-23 DIAGNOSIS — N2581 Secondary hyperparathyroidism of renal origin: Secondary | ICD-10-CM | POA: Diagnosis not present

## 2017-01-23 DIAGNOSIS — Z992 Dependence on renal dialysis: Secondary | ICD-10-CM | POA: Diagnosis not present

## 2017-01-23 DIAGNOSIS — N186 End stage renal disease: Secondary | ICD-10-CM | POA: Diagnosis not present

## 2017-01-23 DIAGNOSIS — D509 Iron deficiency anemia, unspecified: Secondary | ICD-10-CM | POA: Diagnosis not present

## 2017-01-25 DIAGNOSIS — N186 End stage renal disease: Secondary | ICD-10-CM | POA: Diagnosis not present

## 2017-01-25 DIAGNOSIS — N2581 Secondary hyperparathyroidism of renal origin: Secondary | ICD-10-CM | POA: Diagnosis not present

## 2017-01-25 DIAGNOSIS — D509 Iron deficiency anemia, unspecified: Secondary | ICD-10-CM | POA: Diagnosis not present

## 2017-01-25 DIAGNOSIS — Z992 Dependence on renal dialysis: Secondary | ICD-10-CM | POA: Diagnosis not present

## 2017-01-27 DIAGNOSIS — N2581 Secondary hyperparathyroidism of renal origin: Secondary | ICD-10-CM | POA: Diagnosis not present

## 2017-01-27 DIAGNOSIS — Z992 Dependence on renal dialysis: Secondary | ICD-10-CM | POA: Diagnosis not present

## 2017-01-27 DIAGNOSIS — D509 Iron deficiency anemia, unspecified: Secondary | ICD-10-CM | POA: Diagnosis not present

## 2017-01-27 DIAGNOSIS — N186 End stage renal disease: Secondary | ICD-10-CM | POA: Diagnosis not present

## 2017-01-30 DIAGNOSIS — D509 Iron deficiency anemia, unspecified: Secondary | ICD-10-CM | POA: Diagnosis not present

## 2017-01-30 DIAGNOSIS — Z992 Dependence on renal dialysis: Secondary | ICD-10-CM | POA: Diagnosis not present

## 2017-01-30 DIAGNOSIS — N186 End stage renal disease: Secondary | ICD-10-CM | POA: Diagnosis not present

## 2017-01-30 DIAGNOSIS — N2581 Secondary hyperparathyroidism of renal origin: Secondary | ICD-10-CM | POA: Diagnosis not present

## 2017-02-01 DIAGNOSIS — Z992 Dependence on renal dialysis: Secondary | ICD-10-CM | POA: Diagnosis not present

## 2017-02-01 DIAGNOSIS — N2581 Secondary hyperparathyroidism of renal origin: Secondary | ICD-10-CM | POA: Diagnosis not present

## 2017-02-01 DIAGNOSIS — N186 End stage renal disease: Secondary | ICD-10-CM | POA: Diagnosis not present

## 2017-02-01 DIAGNOSIS — D509 Iron deficiency anemia, unspecified: Secondary | ICD-10-CM | POA: Diagnosis not present

## 2017-02-03 DIAGNOSIS — Z992 Dependence on renal dialysis: Secondary | ICD-10-CM | POA: Diagnosis not present

## 2017-02-03 DIAGNOSIS — D509 Iron deficiency anemia, unspecified: Secondary | ICD-10-CM | POA: Diagnosis not present

## 2017-02-03 DIAGNOSIS — N2581 Secondary hyperparathyroidism of renal origin: Secondary | ICD-10-CM | POA: Diagnosis not present

## 2017-02-03 DIAGNOSIS — N186 End stage renal disease: Secondary | ICD-10-CM | POA: Diagnosis not present

## 2017-02-06 DIAGNOSIS — N186 End stage renal disease: Secondary | ICD-10-CM | POA: Diagnosis not present

## 2017-02-06 DIAGNOSIS — Z992 Dependence on renal dialysis: Secondary | ICD-10-CM | POA: Diagnosis not present

## 2017-02-06 DIAGNOSIS — N2581 Secondary hyperparathyroidism of renal origin: Secondary | ICD-10-CM | POA: Diagnosis not present

## 2017-02-06 DIAGNOSIS — D509 Iron deficiency anemia, unspecified: Secondary | ICD-10-CM | POA: Diagnosis not present

## 2017-02-07 DIAGNOSIS — B351 Tinea unguium: Secondary | ICD-10-CM | POA: Diagnosis not present

## 2017-02-07 DIAGNOSIS — L851 Acquired keratosis [keratoderma] palmaris et plantaris: Secondary | ICD-10-CM | POA: Diagnosis not present

## 2017-02-07 DIAGNOSIS — E1142 Type 2 diabetes mellitus with diabetic polyneuropathy: Secondary | ICD-10-CM | POA: Diagnosis not present

## 2017-02-08 DIAGNOSIS — N2581 Secondary hyperparathyroidism of renal origin: Secondary | ICD-10-CM | POA: Diagnosis not present

## 2017-02-08 DIAGNOSIS — Z992 Dependence on renal dialysis: Secondary | ICD-10-CM | POA: Diagnosis not present

## 2017-02-08 DIAGNOSIS — D509 Iron deficiency anemia, unspecified: Secondary | ICD-10-CM | POA: Diagnosis not present

## 2017-02-08 DIAGNOSIS — N186 End stage renal disease: Secondary | ICD-10-CM | POA: Diagnosis not present

## 2017-02-10 DIAGNOSIS — D509 Iron deficiency anemia, unspecified: Secondary | ICD-10-CM | POA: Diagnosis not present

## 2017-02-10 DIAGNOSIS — N186 End stage renal disease: Secondary | ICD-10-CM | POA: Diagnosis not present

## 2017-02-10 DIAGNOSIS — N2581 Secondary hyperparathyroidism of renal origin: Secondary | ICD-10-CM | POA: Diagnosis not present

## 2017-02-10 DIAGNOSIS — Z992 Dependence on renal dialysis: Secondary | ICD-10-CM | POA: Diagnosis not present

## 2017-02-11 DIAGNOSIS — Z992 Dependence on renal dialysis: Secondary | ICD-10-CM | POA: Diagnosis not present

## 2017-02-11 DIAGNOSIS — D509 Iron deficiency anemia, unspecified: Secondary | ICD-10-CM | POA: Diagnosis not present

## 2017-02-11 DIAGNOSIS — N186 End stage renal disease: Secondary | ICD-10-CM | POA: Diagnosis not present

## 2017-02-11 DIAGNOSIS — N2581 Secondary hyperparathyroidism of renal origin: Secondary | ICD-10-CM | POA: Diagnosis not present

## 2017-02-12 DIAGNOSIS — Z992 Dependence on renal dialysis: Secondary | ICD-10-CM | POA: Diagnosis not present

## 2017-02-12 DIAGNOSIS — N2581 Secondary hyperparathyroidism of renal origin: Secondary | ICD-10-CM | POA: Diagnosis not present

## 2017-02-12 DIAGNOSIS — N186 End stage renal disease: Secondary | ICD-10-CM | POA: Diagnosis not present

## 2017-02-12 DIAGNOSIS — D509 Iron deficiency anemia, unspecified: Secondary | ICD-10-CM | POA: Diagnosis not present

## 2017-02-13 DIAGNOSIS — N2581 Secondary hyperparathyroidism of renal origin: Secondary | ICD-10-CM | POA: Diagnosis not present

## 2017-02-13 DIAGNOSIS — D509 Iron deficiency anemia, unspecified: Secondary | ICD-10-CM | POA: Diagnosis not present

## 2017-02-13 DIAGNOSIS — N186 End stage renal disease: Secondary | ICD-10-CM | POA: Diagnosis not present

## 2017-02-13 DIAGNOSIS — Z992 Dependence on renal dialysis: Secondary | ICD-10-CM | POA: Diagnosis not present

## 2017-02-15 DIAGNOSIS — N2581 Secondary hyperparathyroidism of renal origin: Secondary | ICD-10-CM | POA: Diagnosis not present

## 2017-02-15 DIAGNOSIS — N186 End stage renal disease: Secondary | ICD-10-CM | POA: Diagnosis not present

## 2017-02-15 DIAGNOSIS — D509 Iron deficiency anemia, unspecified: Secondary | ICD-10-CM | POA: Diagnosis not present

## 2017-02-15 DIAGNOSIS — Z992 Dependence on renal dialysis: Secondary | ICD-10-CM | POA: Diagnosis not present

## 2017-02-17 DIAGNOSIS — N186 End stage renal disease: Secondary | ICD-10-CM | POA: Diagnosis not present

## 2017-02-17 DIAGNOSIS — N2581 Secondary hyperparathyroidism of renal origin: Secondary | ICD-10-CM | POA: Diagnosis not present

## 2017-02-17 DIAGNOSIS — Z992 Dependence on renal dialysis: Secondary | ICD-10-CM | POA: Diagnosis not present

## 2017-02-17 DIAGNOSIS — D509 Iron deficiency anemia, unspecified: Secondary | ICD-10-CM | POA: Diagnosis not present

## 2017-02-21 DIAGNOSIS — N2581 Secondary hyperparathyroidism of renal origin: Secondary | ICD-10-CM | POA: Diagnosis not present

## 2017-02-21 DIAGNOSIS — Z992 Dependence on renal dialysis: Secondary | ICD-10-CM | POA: Diagnosis not present

## 2017-02-21 DIAGNOSIS — N186 End stage renal disease: Secondary | ICD-10-CM | POA: Diagnosis not present

## 2017-02-21 DIAGNOSIS — D509 Iron deficiency anemia, unspecified: Secondary | ICD-10-CM | POA: Diagnosis not present

## 2017-02-22 DIAGNOSIS — D509 Iron deficiency anemia, unspecified: Secondary | ICD-10-CM | POA: Diagnosis not present

## 2017-02-22 DIAGNOSIS — N186 End stage renal disease: Secondary | ICD-10-CM | POA: Diagnosis not present

## 2017-02-22 DIAGNOSIS — Z992 Dependence on renal dialysis: Secondary | ICD-10-CM | POA: Diagnosis not present

## 2017-02-22 DIAGNOSIS — N2581 Secondary hyperparathyroidism of renal origin: Secondary | ICD-10-CM | POA: Diagnosis not present

## 2017-02-24 DIAGNOSIS — D509 Iron deficiency anemia, unspecified: Secondary | ICD-10-CM | POA: Diagnosis not present

## 2017-02-24 DIAGNOSIS — N2581 Secondary hyperparathyroidism of renal origin: Secondary | ICD-10-CM | POA: Diagnosis not present

## 2017-02-24 DIAGNOSIS — N186 End stage renal disease: Secondary | ICD-10-CM | POA: Diagnosis not present

## 2017-02-24 DIAGNOSIS — Z992 Dependence on renal dialysis: Secondary | ICD-10-CM | POA: Diagnosis not present

## 2017-02-27 DIAGNOSIS — Z992 Dependence on renal dialysis: Secondary | ICD-10-CM | POA: Diagnosis not present

## 2017-02-27 DIAGNOSIS — N186 End stage renal disease: Secondary | ICD-10-CM | POA: Diagnosis not present

## 2017-02-27 DIAGNOSIS — N2581 Secondary hyperparathyroidism of renal origin: Secondary | ICD-10-CM | POA: Diagnosis not present

## 2017-02-27 DIAGNOSIS — D509 Iron deficiency anemia, unspecified: Secondary | ICD-10-CM | POA: Diagnosis not present

## 2017-02-28 ENCOUNTER — Encounter (INDEPENDENT_AMBULATORY_CARE_PROVIDER_SITE_OTHER): Payer: Self-pay

## 2017-02-28 ENCOUNTER — Encounter (INDEPENDENT_AMBULATORY_CARE_PROVIDER_SITE_OTHER): Payer: Self-pay | Admitting: Internal Medicine

## 2017-02-28 ENCOUNTER — Ambulatory Visit (INDEPENDENT_AMBULATORY_CARE_PROVIDER_SITE_OTHER): Payer: Medicare Other | Admitting: Internal Medicine

## 2017-02-28 VITALS — BP 110/60 | HR 62 | Temp 98.4°F | Resp 18 | Ht 72.0 in | Wt 218.4 lb

## 2017-02-28 DIAGNOSIS — Z862 Personal history of diseases of the blood and blood-forming organs and certain disorders involving the immune mechanism: Secondary | ICD-10-CM | POA: Diagnosis not present

## 2017-02-28 DIAGNOSIS — R110 Nausea: Secondary | ICD-10-CM | POA: Diagnosis not present

## 2017-02-28 DIAGNOSIS — K2100 Gastro-esophageal reflux disease with esophagitis, without bleeding: Secondary | ICD-10-CM

## 2017-02-28 MED ORDER — ONDANSETRON HCL 4 MG PO TABS: 4.0000 mg | ORAL_TABLET | Freq: Every day | ORAL | 1 refills | Status: AC | PRN

## 2017-02-28 NOTE — Progress Notes (Signed)
Presenting complaint;  Follow-up for GERD iron deficiency anemia.  Database and.  Subjective:  Gregory Rangel is 83 year old Caucasian male who is here for scheduled visit.  He was last seen in November 2017.  He has a history of iron deficiency anemia as well as history of colon carcinoma.  His last surveillance/diagnostic colonoscopy was in November 2016.  He also has chronic GERD complicated by distal esophageal stricture which was last dilated in May 2014. He is accompanied by his wife today. He has occasional heartburn.  He has no swallowing difficulty whatsoever.  He states he eats slowly and chooses food thoroughly.  He complains of nausea which generally occurs on his dialysis days.  He is using Pepto-Bismol on as-needed basis but his wife would like for him to have medication that he could use of Pepto-Bismol does not work.  He denies vomiting.  His bowels move daily.  He denies rectal bleeding.  According to his wife his stools are at times black when he has not taken Pepto-Bismol.  He has occasional fleeting lower abdominal pain. He is on dialysis Monday Wednesday and Friday. He has arthritis and back pain.  He is using cane to move around.  His wife states he does not do much physical activity other than driving himself for dialysis 3 days a week.  Current Medications: Outpatient Encounter Medications as of 02/28/2017  Medication Sig  . acetaminophen (TYLENOL) 500 MG tablet Take 1,000 mg by mouth every 6 (six) hours as needed for mild pain, fever or headache.  . ALPRAZolam (XANAX) 0.5 MG tablet Take 0.5 mg by mouth at bedtime as needed for sleep.  . Cholecalciferol (VITAMIN D3) 400 units tablet Take 400 Units by mouth daily.  . cinacalcet (SENSIPAR) 30 MG tablet Take 60 mg by mouth daily.   . ferric citrate (AURYXIA) 1 GM 210 MG(Fe) tablet Take 420 mg by mouth 3 (three) times daily with meals. Also takes 1 with snack  . folic acid-vitamin b complex-vitamin c-selenium-zinc (DIALYVITE) 3 MG  TABS tablet Take 1 tablet by mouth daily.  Marland Kitchen glipiZIDE (GLUCOTROL XL) 10 MG 24 hr tablet Take 10 mg by mouth every morning.  . midodrine (PROAMATINE) 5 MG tablet 5 mg every Monday, Wednesday, and Friday.   . pantoprazole (PROTONIX) 40 MG tablet TAKE 1 TABLET BY MOUTH EVERY DAY  . rOPINIRole (REQUIP) 0.5 MG tablet Take 0.5 mg by mouth at bedtime as needed (restless leg).   . sevelamer carbonate (RENVELA) 800 MG tablet Take 800 mg by mouth 3 (three) times daily with meals.  . sodium bicarbonate 650 MG tablet Take 650 mg by mouth 2 (two) times daily.  . [DISCONTINUED] multivitamin (RENA-VIT) TABS tablet Take 1 tablet by mouth daily.  . [DISCONTINUED] ofloxacin (OCUFLOX) 0.3 % ophthalmic solution INT 1 GTT IN OD QID FOR 2 WKS  . [DISCONTINUED] TRAVATAN Z 0.004 % SOLN ophthalmic solution PLACE 1 DROP INTO LEFT EYE DAILY   No facility-administered encounter medications on file as of 02/28/2017.      Objective: Blood pressure 110/60, pulse 62, temperature 98.4 F (36.9 C), temperature source Oral, resp. rate 18, height 6' (1.829 m), weight 218 lb 6.4 oz (99.1 kg). Patient is alert and in no acute distress. Conjunctiva is pink. Sclera is nonicteric Oropharyngeal mucosa is normal. No neck masses or thyromegaly noted. Cardiac exam with regular rhythm normal S1 and S2.  He has grade 2/6 systolic murmur best heard at left upper sternal border. Lungs are clear to auscultation. Abdomen is symmetrical  with long midline scar.  He has tiny umbilical hernia.  Abdomen is soft and nontender without organomegaly or masses. No LE edema or clubbing noted.  Labs/studies Results: Lab data from nephrology clinic from 02/22/2017 WBC 9.6 H&H 11.5 and 34.9 Platelet count 238K. AST 8 ALT 11 serum albumin 3.5   Assessment:  #1.  Chronic GERD complicated by esophageal stricture which was last dilated in 2014.  GERD symptoms well controlled and he is not having any dysphagia.  He will continue PPI.  #2.   History of iron deficiency anemia.  Hemoglobin is low but can be explained the basis of chronic kidney disease.  Recent iron studies are not available.  I believe he is getting parenteral iron at the time of dialysis.  History of black stools.  This may very well be due to Pepto-Bismol but will do Hemoccults.  #3.  Nausea usually after dialysis.  He can use ondansetron on as needed basis.  #4.  History of colon carcinoma.  He is status post right hemicolectomy in 1998.  Last colonoscopy was in November 2016 with removal of 3 polyps and 2 were tubular adenomas.   Plan:  Hemoccult when stool is black. Ondansetron 4 mg daily as needed which she generally will need after dialysis. Patient encouraged to increase physical activity as tolerated.  He can do exercises while supine or sitting in a chair. Office visit in 1 year.

## 2017-02-28 NOTE — Patient Instructions (Addendum)
Hemoccult to be done when stool is black. Can take ondansetron soon after dialysis for nausea as needed. Increase physical activity as discussed.

## 2017-03-01 DIAGNOSIS — N2581 Secondary hyperparathyroidism of renal origin: Secondary | ICD-10-CM | POA: Diagnosis not present

## 2017-03-01 DIAGNOSIS — Z992 Dependence on renal dialysis: Secondary | ICD-10-CM | POA: Diagnosis not present

## 2017-03-01 DIAGNOSIS — D509 Iron deficiency anemia, unspecified: Secondary | ICD-10-CM | POA: Diagnosis not present

## 2017-03-01 DIAGNOSIS — N186 End stage renal disease: Secondary | ICD-10-CM | POA: Diagnosis not present

## 2017-03-03 DIAGNOSIS — N186 End stage renal disease: Secondary | ICD-10-CM | POA: Diagnosis not present

## 2017-03-03 DIAGNOSIS — D509 Iron deficiency anemia, unspecified: Secondary | ICD-10-CM | POA: Diagnosis not present

## 2017-03-03 DIAGNOSIS — N2581 Secondary hyperparathyroidism of renal origin: Secondary | ICD-10-CM | POA: Diagnosis not present

## 2017-03-03 DIAGNOSIS — Z992 Dependence on renal dialysis: Secondary | ICD-10-CM | POA: Diagnosis not present

## 2017-03-05 DIAGNOSIS — N2581 Secondary hyperparathyroidism of renal origin: Secondary | ICD-10-CM | POA: Diagnosis not present

## 2017-03-05 DIAGNOSIS — Z992 Dependence on renal dialysis: Secondary | ICD-10-CM | POA: Diagnosis not present

## 2017-03-05 DIAGNOSIS — N186 End stage renal disease: Secondary | ICD-10-CM | POA: Diagnosis not present

## 2017-03-05 DIAGNOSIS — D509 Iron deficiency anemia, unspecified: Secondary | ICD-10-CM | POA: Diagnosis not present

## 2017-03-08 DIAGNOSIS — N2581 Secondary hyperparathyroidism of renal origin: Secondary | ICD-10-CM | POA: Diagnosis not present

## 2017-03-08 DIAGNOSIS — N186 End stage renal disease: Secondary | ICD-10-CM | POA: Diagnosis not present

## 2017-03-08 DIAGNOSIS — D509 Iron deficiency anemia, unspecified: Secondary | ICD-10-CM | POA: Diagnosis not present

## 2017-03-08 DIAGNOSIS — Z992 Dependence on renal dialysis: Secondary | ICD-10-CM | POA: Diagnosis not present

## 2017-03-10 DIAGNOSIS — N2581 Secondary hyperparathyroidism of renal origin: Secondary | ICD-10-CM | POA: Diagnosis not present

## 2017-03-10 DIAGNOSIS — D509 Iron deficiency anemia, unspecified: Secondary | ICD-10-CM | POA: Diagnosis not present

## 2017-03-10 DIAGNOSIS — N186 End stage renal disease: Secondary | ICD-10-CM | POA: Diagnosis not present

## 2017-03-10 DIAGNOSIS — Z992 Dependence on renal dialysis: Secondary | ICD-10-CM | POA: Diagnosis not present

## 2017-03-13 DIAGNOSIS — Z992 Dependence on renal dialysis: Secondary | ICD-10-CM | POA: Diagnosis not present

## 2017-03-13 DIAGNOSIS — N2581 Secondary hyperparathyroidism of renal origin: Secondary | ICD-10-CM | POA: Diagnosis not present

## 2017-03-13 DIAGNOSIS — N186 End stage renal disease: Secondary | ICD-10-CM | POA: Diagnosis not present

## 2017-03-13 DIAGNOSIS — D509 Iron deficiency anemia, unspecified: Secondary | ICD-10-CM | POA: Diagnosis not present

## 2017-07-03 ENCOUNTER — Other Ambulatory Visit (INDEPENDENT_AMBULATORY_CARE_PROVIDER_SITE_OTHER): Payer: Self-pay | Admitting: Internal Medicine

## 2017-07-16 IMAGING — DX DG CHEST 1V PORT
1 series · 1 of 1 positions shown · non-contrast
Comparison: 06/27/2012

CLINICAL DATA: Post dialysis catheter insertion

EXAM:
PORTABLE CHEST - 1 VIEW

[chest ap]
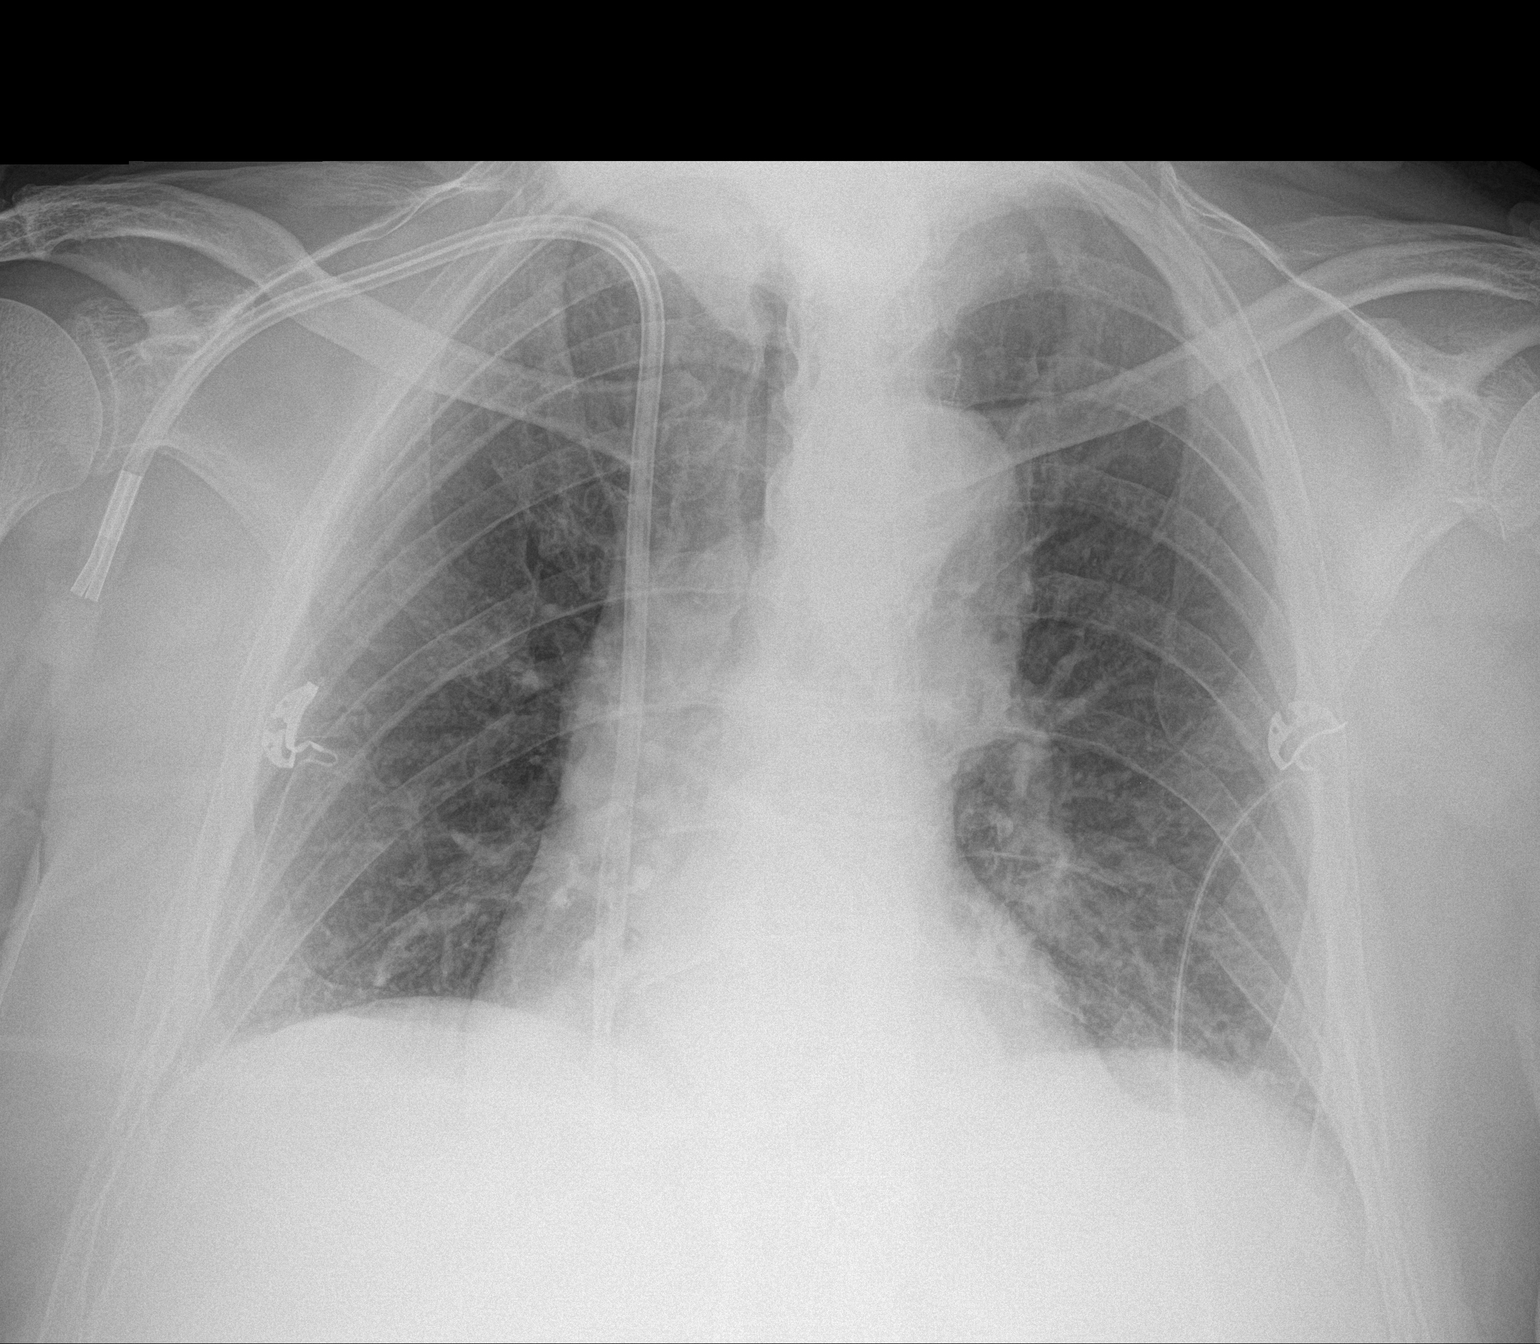

[1 of 1 positions shown; findings below may reference images not displayed]

FINDINGS: Right dialysis catheter tips in the right atrium. No pneumothorax.
No confluent airspace opacities or effusions. Heart is normal size.
IMPRESSION: Dialysis catheter tips in the right atrium.  No pneumothorax.

## 2018-01-12 DEATH — deceased

## 2018-02-27 ENCOUNTER — Ambulatory Visit (INDEPENDENT_AMBULATORY_CARE_PROVIDER_SITE_OTHER): Payer: Medicare Other | Admitting: Internal Medicine
